# Patient Record
Sex: Female | Born: 1970 | Race: Asian | Hispanic: No | Marital: Married | Smoking: Never smoker
Health system: Southern US, Community
[De-identification: ages and names within clinical notes are randomized; demographics above are authoritative.]

## PROBLEM LIST (undated history)

## (undated) DIAGNOSIS — R112 Nausea with vomiting, unspecified: Secondary | ICD-10-CM

## (undated) DIAGNOSIS — M549 Dorsalgia, unspecified: Secondary | ICD-10-CM

## (undated) DIAGNOSIS — Z87442 Personal history of urinary calculi: Secondary | ICD-10-CM

## (undated) DIAGNOSIS — E119 Type 2 diabetes mellitus without complications: Secondary | ICD-10-CM

## (undated) DIAGNOSIS — Z9889 Other specified postprocedural states: Secondary | ICD-10-CM

## (undated) DIAGNOSIS — E1165 Type 2 diabetes mellitus with hyperglycemia: Secondary | ICD-10-CM

## (undated) DIAGNOSIS — N189 Chronic kidney disease, unspecified: Secondary | ICD-10-CM

## (undated) HISTORY — PX: CHOLECYSTECTOMY, LAPAROSCOPIC: SHX56

## (undated) HISTORY — DX: Type 2 diabetes mellitus without complications: E11.9

## (undated) HISTORY — DX: Type 2 diabetes mellitus with hyperglycemia: E11.65

## (undated) HISTORY — DX: Dorsalgia, unspecified: M54.9

---

## 1998-03-25 ENCOUNTER — Encounter: Payer: Self-pay | Admitting: General Surgery

## 1998-03-25 ENCOUNTER — Observation Stay (HOSPITAL_COMMUNITY): Admission: RE | Admit: 1998-03-25 | Discharge: 1998-03-26 | Payer: Self-pay | Admitting: General Surgery

## 2000-02-18 ENCOUNTER — Other Ambulatory Visit: Admission: RE | Admit: 2000-02-18 | Discharge: 2000-02-18 | Payer: Self-pay | Admitting: *Deleted

## 2000-02-24 ENCOUNTER — Encounter: Admission: RE | Admit: 2000-02-24 | Discharge: 2000-02-24 | Payer: Self-pay | Admitting: *Deleted

## 2000-02-24 ENCOUNTER — Encounter: Payer: Self-pay | Admitting: *Deleted

## 2004-03-27 ENCOUNTER — Encounter: Admission: RE | Admit: 2004-03-27 | Discharge: 2004-03-27 | Payer: Self-pay | Admitting: *Deleted

## 2004-05-02 ENCOUNTER — Encounter: Admission: RE | Admit: 2004-05-02 | Discharge: 2004-05-02 | Payer: Self-pay | Admitting: Orthopedic Surgery

## 2005-07-16 IMAGING — US US SOFT TISSUE HEAD/NECK
1 series · 14 of 25 positions shown · non-contrast
Comparison: None.

CLINICAL DATA: Right sided neck mass. 
 SOFT TISSUE ULTRASOUND HEAD AND NECK:
TECHNIQUE: Real time multiplanar gray scale ultrasonography of the thyroid gland was performed.  
 The right thyroid lobe measures 4.8 x 1.1 x 1.9cm.  The left lobe measures 4.2 x 1.1 x 1.6cm.  The isthmus is approximately 4mm in thickness.  The thyroid echotexture is minimally heterogeneous without focal thyroid nodule.

[Series 1: unknown · 0.09mm/px · 14 of 29 slices shown]
[im 1/29]
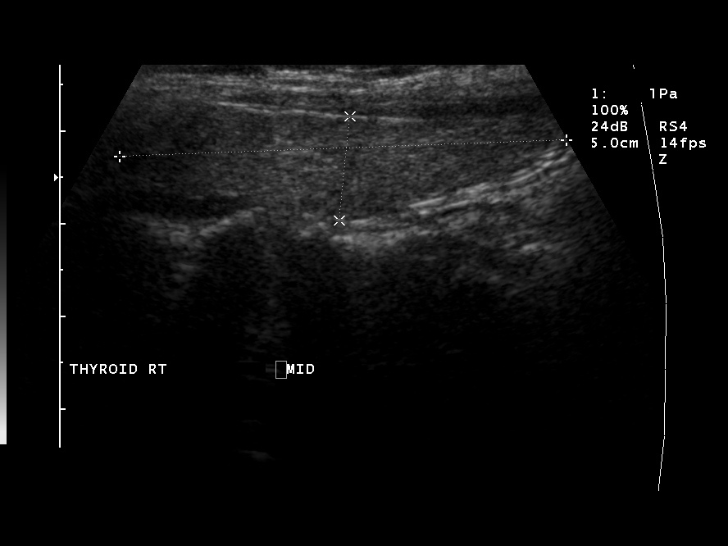
[im 3/29]
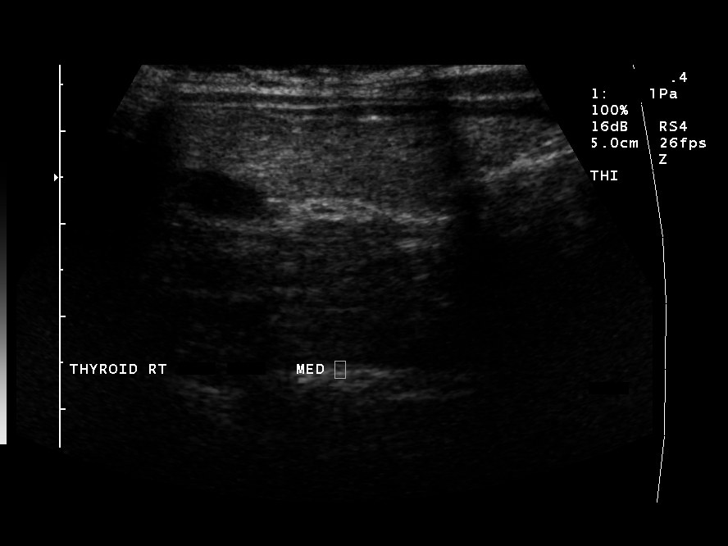
[im 5/29]
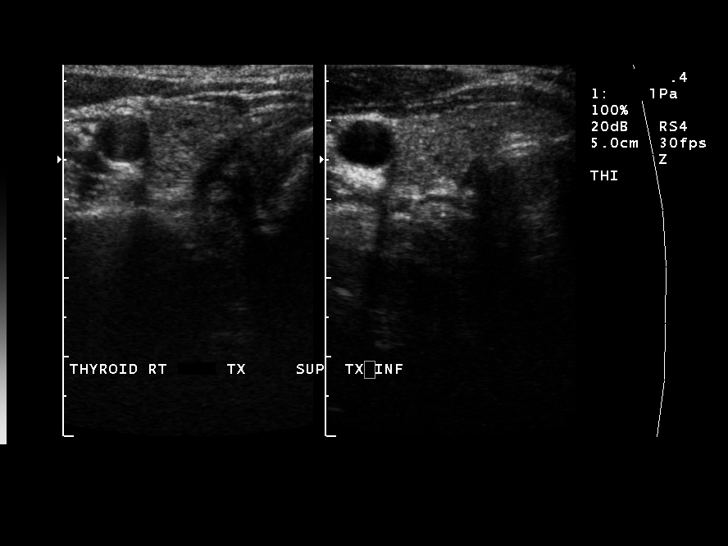
[im 8/29]
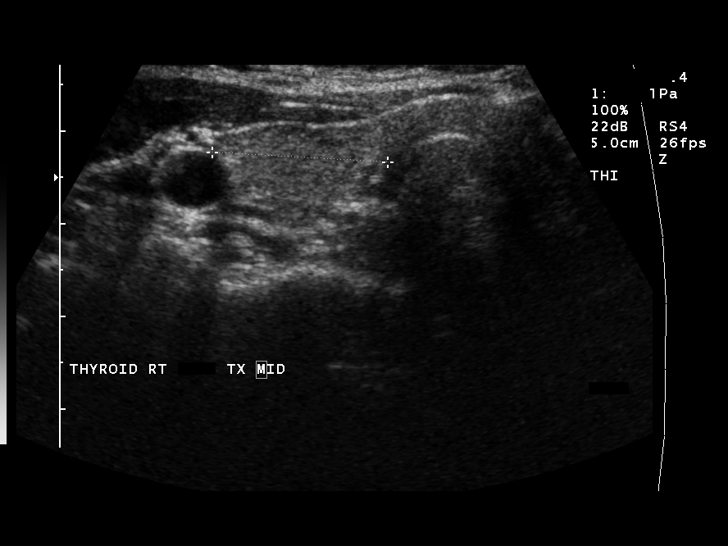
[im 10/29]
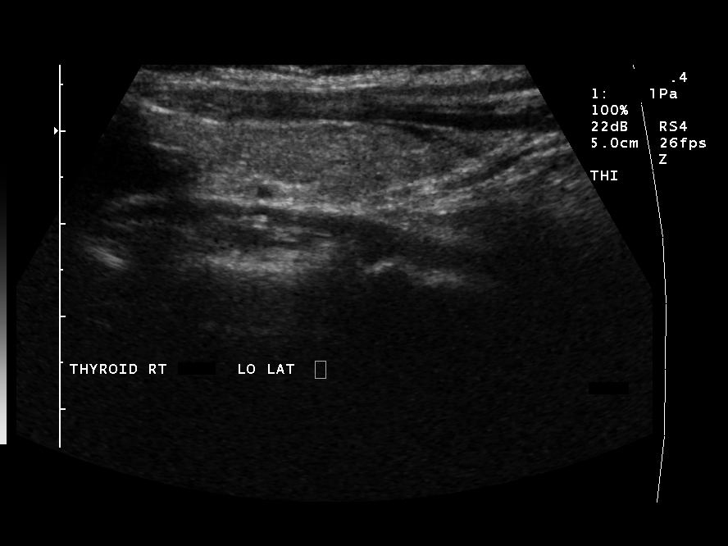
[im 11/29]
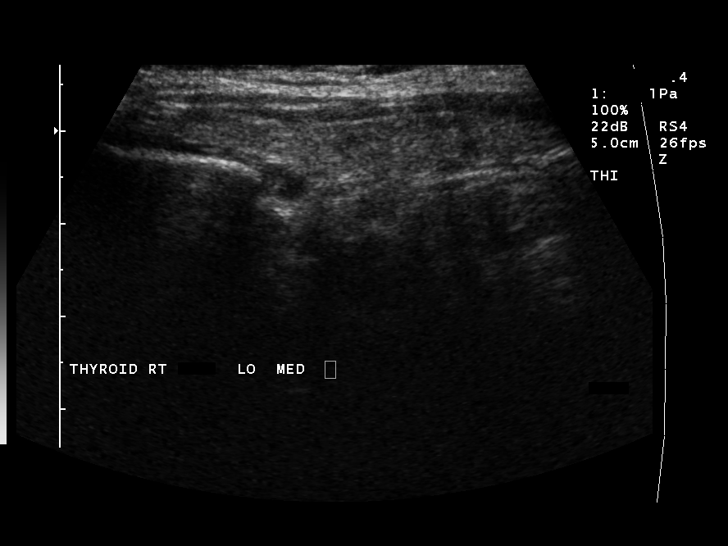
[im 13/29]
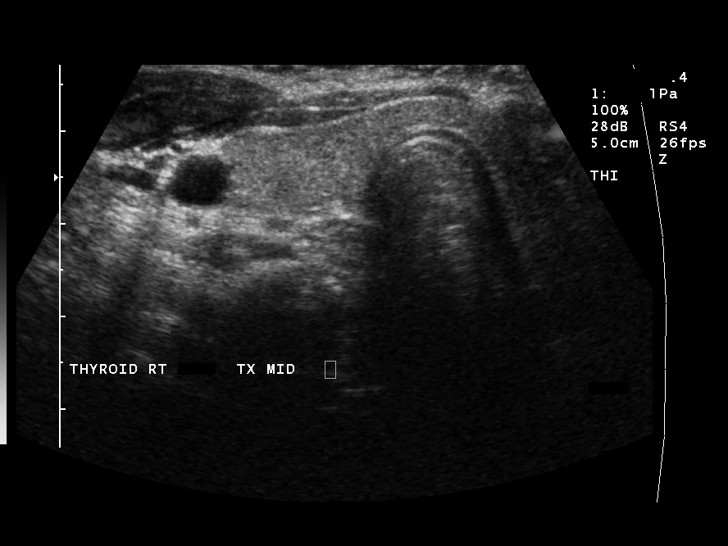
[im 16/29]
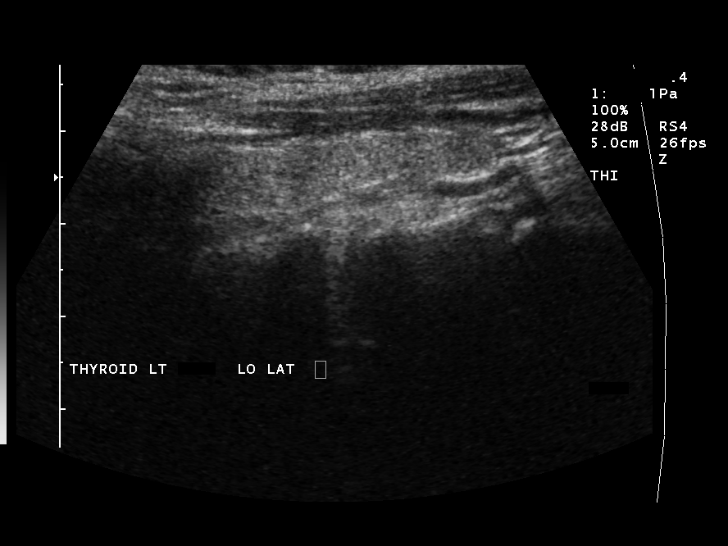
[im 18/29]
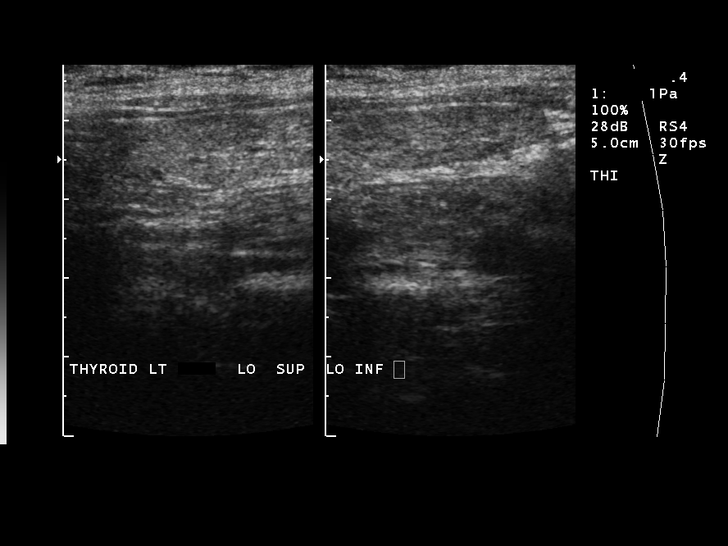
[im 19/29]
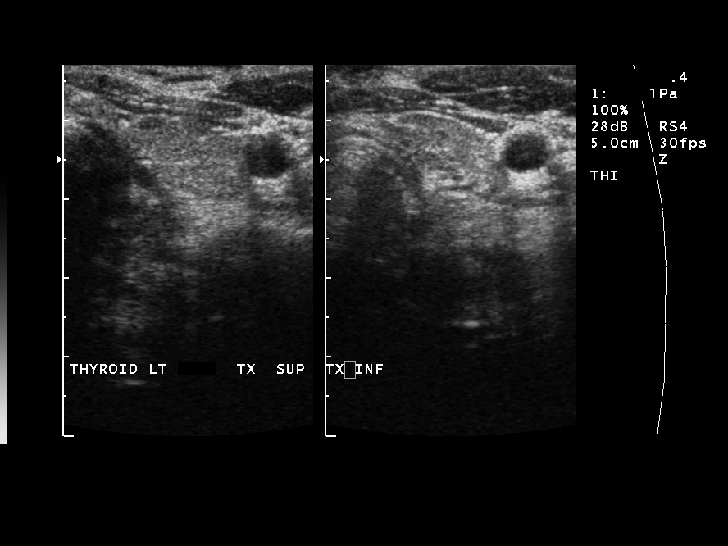
[im 22/29]
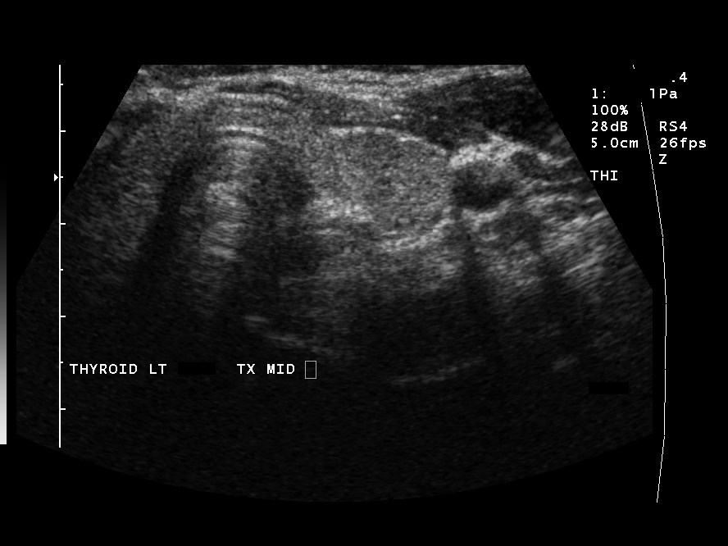
[im 24/29]
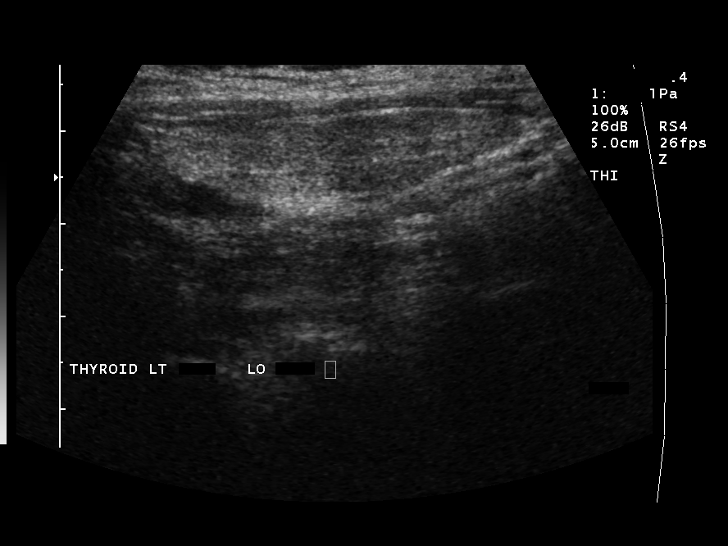
[im 26/29]
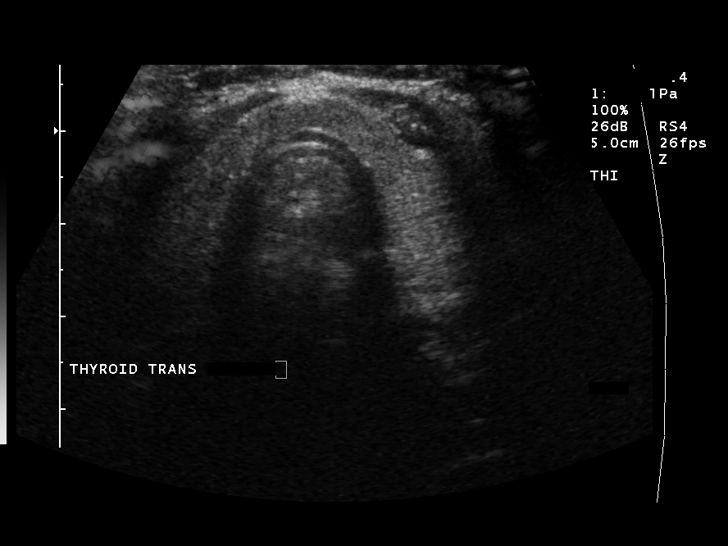
[im 29/29]
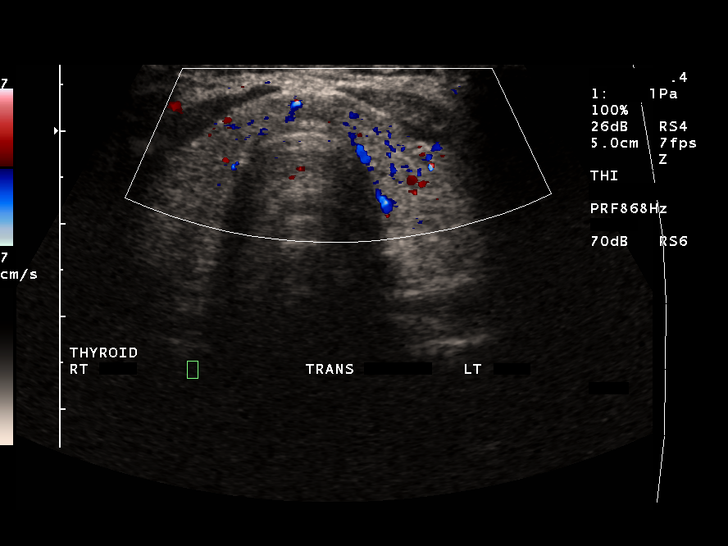

[14 of 25 positions shown; findings below may reference images not displayed]

IMPRESSION: Mild heterogeneity of the thyroid echotexture without a discrete lesion.  
 Ultrasound is effective at evaluating the thyroid gland, but if the patient?s right neck mass is not in the thyroid gland, CT scanning would be a better means to evaluate.

## 2010-01-26 ENCOUNTER — Encounter: Payer: Self-pay | Admitting: Family Medicine

## 2010-01-26 ENCOUNTER — Encounter: Payer: Self-pay | Admitting: Orthopedic Surgery

## 2011-08-28 ENCOUNTER — Emergency Department (HOSPITAL_COMMUNITY): Payer: Medicaid Other

## 2011-08-28 ENCOUNTER — Emergency Department (HOSPITAL_COMMUNITY)
Admission: EM | Admit: 2011-08-28 | Discharge: 2011-08-28 | Disposition: A | Discharge status: Home / Self Care | Payer: Medicaid Other | Attending: Emergency Medicine | Admitting: Emergency Medicine

## 2011-08-28 ENCOUNTER — Encounter (HOSPITAL_COMMUNITY): Payer: Self-pay | Admitting: Emergency Medicine

## 2011-08-28 DIAGNOSIS — N200 Calculus of kidney: Secondary | ICD-10-CM

## 2011-08-28 DIAGNOSIS — N201 Calculus of ureter: Secondary | ICD-10-CM

## 2011-08-28 LAB — URINALYSIS, ROUTINE W REFLEX MICROSCOPIC
Bilirubin Urine: NEGATIVE
Ketones, ur: 15 mg/dL — AB
Specific Gravity, Urine: 1.034 — ABNORMAL HIGH (ref 1.005–1.030)
pH: 6.5 (ref 5.0–8.0)

## 2011-08-28 LAB — URINE MICROSCOPIC-ADD ON

## 2011-08-28 MED ORDER — OXYCODONE-ACETAMINOPHEN 5-325 MG PO TABS
2.0000 | ORAL_TABLET | Freq: Once | ORAL | Status: AC
  Administered 2011-08-28: 2 via ORAL
  Filled 2011-08-28: qty 2

## 2011-08-28 MED ORDER — IBUPROFEN 800 MG PO TABS: 800.0000 mg | ORAL_TABLET | Freq: Three times a day (TID) | ORAL | Status: AC

## 2011-08-28 MED ORDER — OXYCODONE-ACETAMINOPHEN 5-325 MG PO TABS: 2.0000 | ORAL_TABLET | ORAL | Status: AC | PRN

## 2011-08-28 NOTE — ED Provider Notes (Signed)
History     CSN: [id number]  Arrival date & time 08/28/11  1048   First MD Initiated Contact with Patient 08/28/11 1247      Chief Complaint  Patient presents with  . Flank Pain  . Hematuria    (Consider location/radiation/quality/duration/timing/severity/associated sxs/prior treatment) HPI Comments: 41 y/o female presents with sudden onset left sided flank pain at 6am this morning. States this woke her up from sleep. Pain comes and goes at random, describes it as sharp and pressure-like rated 10/10. Felt as if she had to urinate so went to the bathroom and only a little urine came out. States urine appeared bloody. Denies dysuria. No hx of kidney stones. Denies increased frequency, abdominal pain, back pain, n/v, fever, chills, chest pain, sob. Took a percocet she had at home with only some mild relief. She was feeling well prior to this morning. Does not get her menses due to having mirena. Denies any vaginal pain or discharge.  Patient is a 41 y.o. female presenting with flank pain and hematuria. The history is provided by the patient.  Flank Pain Pertinent negatives include no abdominal pain, chest pain, chills, fever, nausea or vomiting.  Hematuria Irritative symptoms include urgency. Irritative symptoms do not include frequency. Associated symptoms include flank pain. Pertinent negatives include no abdominal pain, chills, fever, nausea or vomiting.    History reviewed. No pertinent past medical history.  History reviewed. No pertinent past surgical history.  History reviewed. No pertinent family history.  History  Substance Use Topics  . Smoking status: Never Smoker   . Smokeless tobacco: Not on file  . Alcohol Use: No    OB History    Grav Para Term Preterm Abortions TAB SAB Ect Mult Living                  Review of Systems  Constitutional: Negative for fever and chills.  Respiratory: Negative for shortness of breath.   Cardiovascular: Negative for chest pain.    Gastrointestinal: Negative for nausea, vomiting and abdominal pain.  Genitourinary: Positive for urgency, hematuria and flank pain. Negative for frequency, vaginal discharge and vaginal pain.    Allergies  Review of patient's allergies indicates no known allergies.  Home Medications  No current outpatient prescriptions on file.  BP 141/95  Pulse 104  Temp 98.1 F (36.7 C) (Oral)  Resp 16  SpO2 94%  Physical Exam  Constitutional: She is oriented to person, place, and time. She appears well-developed and well-nourished. No distress.  HENT:  Head: Normocephalic and atraumatic.  Mouth/Throat: Oropharynx is clear and moist.  Eyes: Conjunctivae and EOM are normal.  Neck: Neck supple.  Cardiovascular: Normal rate, regular rhythm and normal heart sounds.   Pulmonary/Chest: Effort normal and breath sounds normal.  Abdominal: Soft. Normal appearance and bowel sounds are normal. There is no tenderness. There is no CVA tenderness.  Neurological: She is alert and oriented to person, place, and time.  Skin: Skin is warm and dry. She is not diaphoretic.  Psychiatric: She has a normal mood and affect. Her behavior is normal.    ED Course  Procedures (including critical care time)  Labs Reviewed  URINALYSIS, ROUTINE W REFLEX MICROSCOPIC - Abnormal; Notable for the following:    Color, Urine AMBER (*)  BIOCHEMICALS MAY BE AFFECTED BY COLOR   APPearance CLOUDY (*)     Specific Gravity, Urine 1.034 (*)     Glucose, UA 500 (*)     Hgb urine dipstick LARGE (*)  Ketones, ur 15 (*)     Protein, ur 100 (*)     Leukocytes, UA TRACE (*)     All other components within normal limits  URINE MICROSCOPIC-ADD ON - Abnormal; Notable for the following:    Squamous Epithelial / LPF FEW (*)     Bacteria, UA FEW (*)     All other components within normal limits  POCT PREGNANCY, URINE   Ct Abdomen Pelvis Wo Contrast  08/28/2011  *RADIOLOGY REPORT*  Clinical Data: Left flank pain.  Dysuria.   Hematuria.  CT ABDOMEN AND PELVIS WITHOUT CONTRAST  Technique:  Multidetector CT imaging of the abdomen and pelvis was performed following the standard protocol without intravenous contrast.  Comparison: None.  Findings: The visualized portion of the liver, spleen, pancreas, and adrenal glands appear unremarkable in noncontrast CT appearance.  The gallbladder is surgically absent.  Common bile duct measures approximately 9 mm in diameter, possibly representing physiologic response to cholecystectomy.  Bilateral nephrolithiasis noted, with six right-sided renal calculi measuring up to 4 mm in diameter and four left renal calculi measuring up to 3 mm in length.  There is also a 2 mm calculus in the left proximal ureter at the level of the lower pole of the left kidney as shown on image 63 of series 5.  Mild left caliectasis noted.  No distal ureteral calculus observed.  Urinary bladder appears unremarkable.  The appendix appears normal.  No dilated bowel observed.  An IUD is present in the uterus.  Uterine contour is unremarkable.  IMPRESSION:  1.  Bilateral nephrolithiasis, with mild left caliectasis and a 2 mm left proximal ureteral calculus.   Original Report Authenticated By: Dellia Cloud, M.D.      1. Nephrolithiasis   2. Ureteral calculi       MDM  41 y/o female with sudden onset left sided flank pain and hematuria. Urine showing blood. Negative CVA or abdominal/pelvic tenderness on exam. Will obtain CT scan to find possible cause of hematuria and intense pain. 2:17 PM CT positive for bilateral nephrolithiasis, caliectasis, and left proximal ureteral calculus. Will discharge with pain control, ibuprofen, urine strainer, and f/u with urology.         Trevor Mace, PA-C 08/28/11 1428

## 2011-08-28 NOTE — ED Provider Notes (Signed)
Patient developed left flank pain onset this morning pain severe intermittent lasting 5 minutes at a time she is presently asymptomatic  Doug Sou, MD 08/28/11 1408

## 2011-08-28 NOTE — ED Provider Notes (Signed)
Medical screening examination/treatment/procedure(s) were conducted as a shared visit with non-physician practitioner(s) and myself.  I personally evaluated the patient during the encounter  Rochanda Harpham, MD 08/28/11 1715 

## 2011-08-28 NOTE — ED Notes (Signed)
Pt states she has pain L flank pain and painful urination. Pt denies vaginal bleeding or discharge. States she has an IUD. The pain began this morning around 4am. Denies pain at present. Pain is intermittent. When pain occurs, it is 10/10. Denies abd pain/discomfort.

## 2011-08-28 NOTE — ED Notes (Signed)
Pt c/o left side pain with bloody urine

## 2011-12-28 ENCOUNTER — Inpatient Hospital Stay (HOSPITAL_COMMUNITY)
Admission: AD | Admit: 2011-12-28 | Discharge: 2011-12-28 | Disposition: A | Discharge status: Hospice | Payer: Medicaid Other | Source: Ambulatory Visit | Attending: Family Medicine | Admitting: Family Medicine

## 2011-12-28 ENCOUNTER — Encounter (HOSPITAL_COMMUNITY): Payer: Self-pay | Admitting: *Deleted

## 2011-12-28 DIAGNOSIS — Z30432 Encounter for removal of intrauterine contraceptive device: Secondary | ICD-10-CM | POA: Insufficient documentation

## 2011-12-28 DIAGNOSIS — N949 Unspecified condition associated with female genital organs and menstrual cycle: Secondary | ICD-10-CM | POA: Insufficient documentation

## 2011-12-28 DIAGNOSIS — R102 Pelvic and perineal pain: Secondary | ICD-10-CM

## 2011-12-28 LAB — URINALYSIS, ROUTINE W REFLEX MICROSCOPIC
Bilirubin Urine: NEGATIVE
Ketones, ur: NEGATIVE mg/dL
Nitrite: NEGATIVE
Protein, ur: NEGATIVE mg/dL
Urobilinogen, UA: 0.2 mg/dL (ref 0.0–1.0)

## 2011-12-28 LAB — URINE MICROSCOPIC-ADD ON

## 2011-12-28 MED ORDER — IBUPROFEN 600 MG PO TABS: 600.0000 mg | ORAL_TABLET | Freq: Four times a day (QID) | ORAL | Status: DC | PRN

## 2011-12-28 NOTE — MAU Note (Addendum)
Had IUD placed 5 yrs ago.  Is overdue to come out, it is irritating.lower abd cramping; has been having pain with urination- started a couple days ago. Migraine headache off and on last couple days.

## 2011-12-28 NOTE — MAU Provider Note (Signed)
CC: overdue to have IUD removed.     First Provider Initiated Contact with Patient 12/28/11 1903      HPI [name] is a 41 y.o. [id number] who is requesting removal of Mirena IUD which has been in about 6 years. She has been having pelvic pressure for months and external irritation after voiding. Denies frequency, urgency, hematuria. Lost health insurance so could not go back to GYN provider and would like GYN appointment for IUD insertion.  LMP 12/20/11. Denies abnormal bleeding.  Past Medical History  Diagnosis Date  . Kidney stone     OB History    Grav Para Term Preterm Abortions TAB SAB Ect Mult Living   4 1 1  3 3    1      # Outc Date GA Lbr Len/2nd Wgt Sex Del Anes PTL Lv   1 TRM            2 TAB            3 TAB            4 TAB               Past Surgical History  Procedure Date  . Cholecystectomy, laparoscopic     History   Social History  . Marital Status: Married    Spouse Name: N/A    Number of Children: N/A  . Years of Education: N/A   Occupational History  . Not on file.   Social History Main Topics  . Smoking status: Never Smoker   . Smokeless tobacco: Not on file  . Alcohol Use: No  . Drug Use: Yes     Comment: takes Hydrocodone for R Flank pain for past 3 years;  . Sexually Active:    Other Topics Concern  . Not on file   Social History Narrative  . No narrative on file    No current facility-administered medications on file prior to encounter.   No current outpatient prescriptions on file prior to encounter.    No Known Allergies  ROS Pertinent items in HPI  PHYSICAL EXAM Filed Vitals:   12/28/11 1836  BP: 132/84  Pulse: 90  Temp:   Resp: 16   General: Well nourished, well developed female in no acute distress Cardiovascular: Normal rate Respiratory: Normal effort Abdomen: Soft, nontender Back: No CVAT Extremities: No edema Neurologic: Alert and oriented Speculum exam: NEFG; vagina with physiologic discharge, no  blood; cervix clean IUD Removal  Patient was in the dorsal lithotomy position, normal external genitalia was noted.  A speculum was placed in the patient's vagina, normal discharge was noted, no lesions. The multiparous cervix was visualized, no lesions, no abnormal discharge. The strings of the IUD were grasped and pulled using ring forceps.  The IUD was successfully removed in its entirety. Scant bleeding.  Patient tolerated the procedure well.    LAB RESULTS Results for orders placed during the hospital encounter of 12/28/11 (from the past 24 hour(s))  URINALYSIS, ROUTINE W REFLEX MICROSCOPIC     Status: Abnormal   Collection Time   12/28/11  6:10 PM      Component Value Range   Color, Urine STRAW (*) YELLOW   APPearance CLEAR  CLEAR   Specific Gravity, Urine <1.005 (*) 1.005 - 1.030   pH 7.0  5.0 - 8.0   Glucose, UA NEGATIVE  NEGATIVE mg/dL   Hgb urine dipstick MODERATE (*) NEGATIVE   Bilirubin Urine NEGATIVE  NEGATIVE  Ketones, ur NEGATIVE  NEGATIVE mg/dL   Protein, ur NEGATIVE  NEGATIVE mg/dL   Urobilinogen, UA 0.2  0.0 - 1.0 mg/dL   Nitrite NEGATIVE  NEGATIVE   Leukocytes, UA SMALL (*) NEGATIVE  URINE MICROSCOPIC-ADD ON     Status: Abnormal   Collection Time   12/28/11  6:10 PM      Component Value Range   Squamous Epithelial / LPF FEW (*) RARE   WBC, UA 0-2  <3 WBC/hpf   RBC / HPF 3-6  <3 RBC/hpf   Bacteria, UA RARE  RARE  POCT PREGNANCY, URINE     Status: Normal   Collection Time   12/28/11  7:46 PM      Component Value Range   Preg Test, Ur NEGATIVE  NEGATIVE       ASSESSMENT IUD removal successful  PLAN Urine C&S sent Discharge home. See AVS for patient education. Counseled to use condoms or abstinence until GYN appointment. Explained that she can apply for Mirena scholarship but will need to pay for insertion fee.    Danae Orleans, CNM 12/28/2011 7:13 PM

## 2011-12-28 NOTE — MAU Provider Note (Signed)
Chart reviewed and agree with management and plan.  

## 2012-01-20 ENCOUNTER — Ambulatory Visit (INDEPENDENT_AMBULATORY_CARE_PROVIDER_SITE_OTHER): Payer: Medicaid Other | Admitting: Obstetrics & Gynecology

## 2012-01-20 ENCOUNTER — Encounter: Payer: Self-pay | Admitting: Obstetrics & Gynecology

## 2012-01-20 VITALS — BP 139/83 | HR 85 | Temp 96.7°F | Ht 59.0 in | Wt 153.3 lb

## 2012-01-20 DIAGNOSIS — Z30430 Encounter for insertion of intrauterine contraceptive device: Secondary | ICD-10-CM

## 2012-01-20 LAB — POCT PREGNANCY, URINE: Preg Test, Ur: NEGATIVE

## 2012-01-20 NOTE — Patient Instructions (Signed)

## 2012-01-20 NOTE — Progress Notes (Signed)
Patient ID: [name], female   DOB: [birth year], 42 y.o.   MRN: [id number] Patient identified, informed consent performed.  Discussed risks of irregular bleeding, cramping, infection, malpositioning or misplacement of the IUD outside the uterus which may require further procedures. Time out was performed.  Urine pregnancy test negative.  Speculum placed in the vagina.  Cervix visualized.  Cleaned with Betadine x 2. Hurricaine spray applied to ant lip of cervix.  Cervix grasped anteriorly with a single tooth tenaculum.  Uterus sounded to 7 cm.  Mirena IUD placed per manufacturer's recommendations.  Strings trimmed to 3 cm. Tenaculum was removed, good hemostasis noted.  Patient tolerated procedure well.   Patient was given post-procedure instructions and the Mirena care card with expiration date.  Patient was also asked to check IUD strings periodically and follow up in 4-6 weeks for IUD check.

## 2012-02-19 ENCOUNTER — Ambulatory Visit: Payer: Medicaid Other | Admitting: Obstetrics & Gynecology

## 2012-02-29 ENCOUNTER — Encounter: Payer: Self-pay | Admitting: Obstetrics & Gynecology

## 2012-03-14 ENCOUNTER — Ambulatory Visit: Payer: Medicaid Other | Admitting: Obstetrics & Gynecology

## 2012-06-22 ENCOUNTER — Ambulatory Visit (INDEPENDENT_AMBULATORY_CARE_PROVIDER_SITE_OTHER): Payer: Medicaid Other | Admitting: Nurse Practitioner

## 2012-06-22 ENCOUNTER — Encounter: Payer: Self-pay | Admitting: Nurse Practitioner

## 2012-06-22 VITALS — BP 122/83 | HR 92 | Ht 59.0 in | Wt 152.0 lb

## 2012-06-22 DIAGNOSIS — M546 Pain in thoracic spine: Secondary | ICD-10-CM | POA: Insufficient documentation

## 2012-06-22 DIAGNOSIS — M545 Low back pain: Secondary | ICD-10-CM | POA: Insufficient documentation

## 2012-06-22 NOTE — Progress Notes (Signed)
HPI: Patient returns for followup after initial visit with Dr. Terrace Arabia 10/27/2011 for right thoracic pain. She complains of for years history of right thoracic pain, no trigger identified, right side deep achy pain, felt something was moving if she is twisting her trunk, no gait difficulty, no incontinence. Over the years, she has tried and failed chiropractor, physical therapy, anti-inflammatory medications, epidural injection, reported normal MRI lumbar a year ago She has acute abdominal pain, was diagnosed with kidney stone recently She continues to complain of pain in the right flank area, it is not along the spine, her thoracic MRI of the spine was normal. She is seen by pain management clinic in [place]. She has never been to a urologist.  ROS:  negative  Physical Exam General: well developed, obese female, seated, in no evident distress Head: head normocephalic and atraumatic. Oropharynx benign Neck: supple with no carotid  bruits Cardiovascular: regular rate and rhythm, no murmurs  Neurologic Exam Mental Status: Awake and fully alert. Oriented to place and time. Follows all commands. Speech and language normal.   Cranial Nerves:  Pupils equal, briskly reactive to light. Extraocular movements full without nystagmus. Visual fields full to confrontation. Hearing intact and symmetric to finger snap. Facial sensation intact. Face, tongue, palate move normally and symmetrically. Neck flexion and extension normal.  Motor: Normal bulk and tone. Normal strength in all tested extremity muscles.No focal weakness Sensory.: intact to touch and pinprick and vibratory.  Coordination: Rapid alternating movements normal in all extremities. Finger-to-nose and heel-to-shin performed accurately bilaterally. No dysmetria Gait and Station: Arises from chair without difficulty. Stance is normal.  Able to heel, toe and tandem walk without difficulty.  Reflexes: 2+ and symmetric. Toes downgoing.      ASSESSMENT: 3-1/2 year history of right lower thoracic paraspinal muscle tenderness with normal neurologic exam and normal thoracic MRI Kidney stones per CT of the abdomen     PLAN: Questionable etiology of her back pain, currently being seen at pain clinic Followup when necessary  Nilda Riggs, GNP-BC APRN

## 2012-06-22 NOTE — Patient Instructions (Addendum)
Given copy of normal MRI of the thoracic spine Patient claims she is not aware that she has kidney stones, given ER record, she never followed up with urology Followup as necessary

## 2012-07-18 ENCOUNTER — Other Ambulatory Visit: Payer: Self-pay | Admitting: Urology

## 2012-07-19 ENCOUNTER — Encounter (HOSPITAL_COMMUNITY): Payer: Self-pay

## 2012-07-19 ENCOUNTER — Encounter (HOSPITAL_COMMUNITY): Payer: Self-pay | Admitting: Pharmacy Technician

## 2012-07-19 ENCOUNTER — Encounter (HOSPITAL_COMMUNITY)
Admission: RE | Admit: 2012-07-19 | Discharge: 2012-07-19 | Disposition: A | Discharge status: Home / Self Care | Payer: Medicaid Other | Source: Ambulatory Visit | Attending: Urology | Admitting: Urology

## 2012-07-19 DIAGNOSIS — Z01812 Encounter for preprocedural laboratory examination: Secondary | ICD-10-CM | POA: Insufficient documentation

## 2012-07-19 HISTORY — DX: Personal history of urinary calculi: Z87.442

## 2012-07-19 LAB — CBC
HCT: 40.5 % (ref 36.0–46.0)
Hemoglobin: 13.3 g/dL (ref 12.0–15.0)
RBC: 4.62 MIL/uL (ref 3.87–5.11)
RDW: 12.5 % (ref 11.5–15.5)
WBC: 7.6 10*3/uL (ref 4.0–10.5)

## 2012-07-19 NOTE — Patient Instructions (Signed)
[name]  07/19/2012                           YOUR PROCEDURE IS SCHEDULED ON: 07/29/12               PLEASE REPORT TO SHORT STAY CENTER AT : 6:45 am               CALL THIS NUMBER IF ANY PROBLEMS THE DAY OF SURGERY :               [phone]                      REMEMBER:   Do not eat food or drink liquids AFTER MIDNIGHT    Take these medicines the morning of surgery with A SIP OF WATER: hydrocodone if needed   Do not wear jewelry, make-up   Do not wear lotions, powders, or perfumes.   Do not shave legs or underarms 12 hrs. before surgery (men may shave face)  Do not bring valuables to the hospital.  Contacts, dentures or bridgework may not be worn into surgery.  Leave suitcase in the car. After surgery it may be brought to your room.  For patients admitted to the hospital more than one night, checkout time is 11:00                          The day of discharge.   Patients discharged the day of surgery will not be allowed to drive home                             If going home same day of surgery, must have someone stay with you first                           24 hrs at home and arrange for some one to drive you home from hospital.    Special Instructions:   Please read over the following fact sheets that you were given:                            2. [place] PREPARING FOR SURGERY SHEET                                                X_____________________________________________________________________        Failure to follow these instructions may result in cancellation of your surgery

## 2012-07-28 MED ORDER — GENTAMICIN SULFATE 40 MG/ML IJ SOLN
260.0000 mg | INTRAVENOUS | Status: DC
  Filled 2012-07-28: qty 6.5

## 2012-07-29 ENCOUNTER — Encounter (HOSPITAL_COMMUNITY): Payer: Self-pay | Admitting: *Deleted

## 2012-07-29 ENCOUNTER — Ambulatory Visit (HOSPITAL_COMMUNITY)
Admission: RE | Admit: 2012-07-29 | Discharge: 2012-07-29 | Disposition: A | Discharge status: Home / Self Care | Payer: Medicaid Other | Source: Ambulatory Visit | Attending: Urology | Admitting: Urology

## 2012-07-29 ENCOUNTER — Encounter (HOSPITAL_COMMUNITY): Payer: Self-pay | Admitting: Anesthesiology

## 2012-07-29 ENCOUNTER — Encounter (HOSPITAL_COMMUNITY)
Admission: RE | Disposition: A | Discharge status: Home / Self Care | Payer: Self-pay | Source: Ambulatory Visit | Attending: Urology

## 2012-07-29 ENCOUNTER — Ambulatory Visit (HOSPITAL_COMMUNITY): Payer: Medicaid Other | Admitting: Anesthesiology

## 2012-07-29 DIAGNOSIS — N200 Calculus of kidney: Secondary | ICD-10-CM | POA: Insufficient documentation

## 2012-07-29 DIAGNOSIS — G8929 Other chronic pain: Secondary | ICD-10-CM | POA: Insufficient documentation

## 2012-07-29 DIAGNOSIS — M549 Dorsalgia, unspecified: Secondary | ICD-10-CM | POA: Insufficient documentation

## 2012-07-29 DIAGNOSIS — Z87442 Personal history of urinary calculi: Secondary | ICD-10-CM | POA: Insufficient documentation

## 2012-07-29 HISTORY — PX: CYSTOSCOPY WITH URETEROSCOPY AND STENT PLACEMENT: SHX6377

## 2012-07-29 SURGERY — CYSTOURETEROSCOPY, WITH STENT INSERTION
Anesthesia: General | Laterality: Bilateral | Wound class: Clean Contaminated

## 2012-07-29 MED ORDER — FENTANYL CITRATE 0.05 MG/ML IJ SOLN
INTRAMUSCULAR | Status: AC
  Filled 2012-07-29: qty 2

## 2012-07-29 MED ORDER — FENTANYL CITRATE 0.05 MG/ML IJ SOLN
INTRAMUSCULAR | Status: DC | PRN
  Administered 2012-07-29 (×4): 50 ug via INTRAVENOUS

## 2012-07-29 MED ORDER — SODIUM CHLORIDE 0.9 % IR SOLN
Status: DC | PRN
  Administered 2012-07-29: 4000 mL

## 2012-07-29 MED ORDER — SENNOSIDES-DOCUSATE SODIUM 8.6-50 MG PO TABS: 1.0000 | ORAL_TABLET | Freq: Two times a day (BID) | ORAL | Status: DC

## 2012-07-29 MED ORDER — PROMETHAZINE HCL 25 MG/ML IJ SOLN: 6.2500 mg | INTRAMUSCULAR | Status: DC | PRN

## 2012-07-29 MED ORDER — HYDROCODONE-ACETAMINOPHEN 10-325 MG PO TABS: 1.0000 | ORAL_TABLET | Freq: Four times a day (QID) | ORAL | Status: DC | PRN

## 2012-07-29 MED ORDER — GENTAMICIN SULFATE 40 MG/ML IJ SOLN
103.5000 mg | INTRAVENOUS | Status: DC | PRN
  Administered 2012-07-29: 260 mg via INTRAVENOUS

## 2012-07-29 MED ORDER — KETOROLAC TROMETHAMINE 30 MG/ML IJ SOLN
INTRAMUSCULAR | Status: DC | PRN
  Administered 2012-07-29: 30 mg via INTRAVENOUS

## 2012-07-29 MED ORDER — 0.9 % SODIUM CHLORIDE (POUR BTL) OPTIME
TOPICAL | Status: DC | PRN
  Administered 2012-07-29: 1000 mL

## 2012-07-29 MED ORDER — SULFAMETHOXAZOLE-TMP DS 800-160 MG PO TABS: 1.0000 | ORAL_TABLET | Freq: Every day | ORAL | Status: DC

## 2012-07-29 MED ORDER — LACTATED RINGERS IV SOLN
INTRAVENOUS | Status: DC
  Administered 2012-07-29: 1000 mL via INTRAVENOUS
  Administered 2012-07-29: 08:00:00 via INTRAVENOUS

## 2012-07-29 MED ORDER — INDIGOTINDISULFONATE SODIUM 8 MG/ML IJ SOLN
INTRAMUSCULAR | Status: AC
  Filled 2012-07-29: qty 5

## 2012-07-29 MED ORDER — FENTANYL CITRATE 0.05 MG/ML IJ SOLN
25.0000 ug | INTRAMUSCULAR | Status: DC | PRN
  Administered 2012-07-29 (×3): 50 ug via INTRAVENOUS

## 2012-07-29 MED ORDER — OXYBUTYNIN CHLORIDE 5 MG PO TABS: 5.0000 mg | ORAL_TABLET | Freq: Three times a day (TID) | ORAL | Status: DC | PRN

## 2012-07-29 MED ORDER — IOHEXOL 300 MG/ML  SOLN
INTRAMUSCULAR | Status: DC | PRN
  Administered 2012-07-29: 30 mL

## 2012-07-29 MED ORDER — LACTATED RINGERS IV SOLN: INTRAVENOUS | Status: DC

## 2012-07-29 MED ORDER — ONDANSETRON HCL 4 MG/2ML IJ SOLN
INTRAMUSCULAR | Status: DC | PRN
  Administered 2012-07-29: 4 mg via INTRAVENOUS

## 2012-07-29 MED ORDER — IOHEXOL 300 MG/ML  SOLN
INTRAMUSCULAR | Status: AC
  Filled 2012-07-29: qty 1

## 2012-07-29 MED ORDER — PROPOFOL 10 MG/ML IV BOLUS
INTRAVENOUS | Status: DC | PRN
  Administered 2012-07-29: 180 mg via INTRAVENOUS

## 2012-07-29 MED ORDER — LIDOCAINE HCL 2 % EX GEL
CUTANEOUS | Status: AC
  Filled 2012-07-29: qty 10

## 2012-07-29 MED ORDER — OXYBUTYNIN CHLORIDE 5 MG PO TABS
5.0000 mg | ORAL_TABLET | Freq: Three times a day (TID) | ORAL | Status: DC | PRN
  Administered 2012-07-29: 5 mg via ORAL
  Filled 2012-07-29: qty 1

## 2012-07-29 SURGICAL SUPPLY — 18 items
BAG URO CATCHER STRL LF (DRAPE) ×2 IMPLANT
BASKET LASER NITINOL 1.9FR (BASKET) ×1 IMPLANT
BSKT STON RTRVL 120 1.9FR (BASKET) ×1
CATH INTERMIT  6FR 70CM (CATHETERS) ×1 IMPLANT
CATH URET 5FR 28IN OPEN ENDED (CATHETERS) IMPLANT
CLOTH BEACON ORANGE TIMEOUT ST (SAFETY) ×2 IMPLANT
DRAPE CAMERA CLOSED 9X96 (DRAPES) ×2 IMPLANT
GLOVE BIOGEL M STRL SZ7.5 (GLOVE) ×2 IMPLANT
GOWN PREVENTION PLUS XLARGE (GOWN DISPOSABLE) ×2 IMPLANT
GOWN STRL NON-REIN LRG LVL3 (GOWN DISPOSABLE) ×2 IMPLANT
GUIDEWIRE ANG ZIPWIRE 038X150 (WIRE) ×2 IMPLANT
GUIDEWIRE STR DUAL SENSOR (WIRE) ×2 IMPLANT
MANIFOLD NEPTUNE II (INSTRUMENTS) ×2 IMPLANT
PACK CYSTO (CUSTOM PROCEDURE TRAY) ×2 IMPLANT
SHEATH ACCESS URETERAL 24CM (SHEATH) ×1 IMPLANT
STENT CONTOUR 6FRX24X.038 (STENTS) ×2 IMPLANT
TUBE FEEDING 8FR 16IN STR KANG (MISCELLANEOUS) ×2 IMPLANT
TUBING CONNECTING 10 (TUBING) ×2 IMPLANT

## 2012-07-29 NOTE — H&P (Signed)
[name] is an 42 y.o. female.    Chief Complaint: Pre-op Bilateral Ureteroscopic Stone Manipulation  HPI:   1 - Nephrolithiasis - Passed singel stone 2013. No prior surgeries. CT 08/2011 wtih bilateral <49mm stones. CT 07/2012 with bilateral small volume non-obstructing stones (Lt 4mm lower x2 + upper punctate, Rt punctate x several)  2 - Rt Back Pain - Pt with 5 years + of chronic back pain for which she takes narcotics. Had eval by neurologist and others prior. Pain is constant, non-coliky. No relation go full/empty blader. On exam she has focal TTP T12 area and along Rt T12 dermatome. Recent CT wtih small non-obstructing stones only.  PMH sig for chronic pain (narcotics), lap chole  Today "[name]" is seen to proceed with elective bilateral ureteroscopic stone manipulation. Most recent UA without infectious parameters.  Past Medical History  Diagnosis Date  . Back pain   . History of kidney stones     Past Surgical History  Procedure Laterality Date  . Cholecystectomy, laparoscopic      Family History  Problem Relation Age of Onset  . Other Neg Hx    Social History:  reports that she has never smoked. She has never used smokeless tobacco. She reports that she does not drink alcohol or use illicit drugs.  Allergies:  Allergies  Allergen Reactions  . Tramadol Hives, Itching and Rash    No prescriptions prior to admission    No results found for this or any previous visit (from the past 48 hour(s)). No results found.  Review of Systems  Constitutional: Negative.  Negative for fever and chills.  HENT: Negative.   Eyes: Negative.   Respiratory: Negative.   Cardiovascular: Negative.   Gastrointestinal: Negative.   Genitourinary: Positive for flank pain.  Musculoskeletal: Negative.   Skin: Negative.   Neurological: Negative.   Endo/Heme/Allergies: Negative.   Psychiatric/Behavioral: Negative.     There were no vitals taken for this visit. Physical Exam   Constitutional: She is oriented to person, place, and time. She appears well-developed and well-nourished.  HENT:  Head: Normocephalic and atraumatic.  Eyes: EOM are normal. Pupils are equal, round, and reactive to light.  Neck: Normal range of motion. Neck supple.  Cardiovascular: Normal rate and regular rhythm.   Respiratory: Effort normal and breath sounds normal.  GI: Soft. Bowel sounds are normal.  Genitourinary:  Mild diffuse bilateral CVAT  Musculoskeletal: Normal range of motion.  Neurological: She is alert and oriented to person, place, and time.  Skin: Skin is warm and dry.  Psychiatric: She has a normal mood and affect. Her behavior is normal. Judgment and thought content normal.     Assessment/Plan  1 - Nephrolithiasis - We rediscussed ureteroscopic stone manipulation with basketing and laser-lithotripsy in detail.  We rediscussed risks including bleeding, infection, damage to kidney / ureter  bladder, rarely loss of kidney. We rediscussed anesthetic risks and rare but serious surgical complications including DVT, PE, MI, and mortality. We specificallyre addressed that in 5-10% of cases a staged approach is required with stenting followed by re-attempt ureteroscopy if anatomy unfavorable. The patient voiced understanding and wises to proceed. I alsore stated we will plan on placing bilateral stents as is bilateral procedure.  2 - Rt Flank Pain - I again explained that based on imaging stones werre in my opinion NOT the primary source of her pain, but that they may be contributory. She is a wits end and adamantly wants stones removed. I can certainly understand  her situation and this is reasonable.  [name] 07/29/2012, 6:14 AM

## 2012-07-29 NOTE — Transfer of Care (Addendum)
Immediate Anesthesia Transfer of Care Note  Patient: [name]  Procedure(s) Performed: Procedure(s): CYSTOSCOPY WITH BILATERAL URETEROSCOPY AND BILATERAL STENT PLACEMENT, BASKET EXTRACTION OF BILATERAL URETERAL STONES (Bilateral)  Patient Location: PACU  Anesthesia Type:General  Level of Consciousness: awake and alert   Airway & Oxygen Therapy: Patient Spontanous Breathing and Patient connected to face mask oxygen  Post-op Assessment: Report given to PACU RN and Post -op Vital signs reviewed and stable  Post vital signs: Reviewed and stable  Complications: No apparent anesthesia complications

## 2012-07-29 NOTE — Op Note (Addendum)
[name], [name]                ACCOUNT NO.:  [id number]  MEDICAL RECORD NO.:  [id number]  LOCATION:  [facility]                         FACILITY:  [facility]  PHYSICIAN:  Sebastian Ache, MD     DATE OF BIRTH:  September 19, 1970  DATE OF PROCEDURE:  07/29/2012 DATE OF DISCHARGE:  07/29/2012                              OPERATIVE REPORT   PREOPERATIVE DIAGNOSIS:  Bilateral renal stones, refractory back pain.  PROCEDURE: 1. Cystoscopy with bilateral retrograde pyelogram interpretation. 2. Bilateral ureteroscopy with basketing of stones. 3. Insertion of bilateral ureteral stents, 6 x 24, no tether.  ESTIMATED BLOOD LOSS:  Nil.  FINDINGS: 1. Small volume papillary tip calcifications, left greater than right. 2. Unremarkable right retrograde pyelogram. 3. Unremarkable urinary bladder. 4. Bilateral intrarenal stone for compositional analysis.  INDICATION:  [name] is a very pleasant 42 year old lady, who unfortunately has been troubled by chronic back pain.  She has seen multiple providers for this and remains on chronic narcotics.  She has had further workup including CT scan which revealed bilateral intrarenal stones and she has never had acute colic.  She was referred for consideration of management.  Options were discussed including observation versus removal with ureteroscopy with the goal of perhaps improving her flank pain, although she was extensively counseled towards the fact that it is often felt that nonobstructing stones are usually asymptomatic and that removal of stones may or may not impact her discomfort.  She adamantly wished to proceed.  Informed consent was obtained and placed in medical record.  PROCEDURE IN DETAIL:  The patient being [name] verified.  Procedure being bilateral ureteroscopic stone manipulation was confirmed was carried out.  Procedure was carried out.  Time-out was performed.  Intravenous antibiotics administered.  General LMA anesthesia was introduced.   The patient placed into a low lithotomy position.  Sterile field was created by prepping and draping the patient's the patient's vagina, introitus, and proximal thighs using iodine x3.  Next, cystourethroscopy was performed using a 22-French rigid cystoscope with 12-degree offset lens. Inspection of urinary bladder revealed no diverticula, calcifications, papular lesions.  Ureteral orifices were in normal anatomic position bilaterally.  The left ureteral orifice was cannulated with 6-French end- hole catheter and left retrograde pyelogram seen.  Left retrograde pyelogram demonstrated a single left ureter and single system left kidney.  No filling defects or narrowing noted.  A 0.038 Glidewire was advanced at the level of the upper pole and set aside as a safety wire.  Next, semi-rigid ureteroscopy was performed at the entire length of the left ureter alongside a separate Sensor working wire and an 8-French feeding tube in the urinary bladder for pressure release. This revealed no mucosal abnormalities or calcifications within the left ureter.  Next, the semi-rigid ureteroscope was exchanged for a 12/14, 24 cm ureteral access sheath and under continuous fluoroscopic vision to the level of the proximal ureter.  Next, flexible ureteroscopy was performed using 8-French digital ureteroscope allowing inspection of the proximal ureter and an  systematic inspection of each calyx of the left kidney.  There were multifocal papillary tip calcifications approximately four in total, the largest which appeared to be about 4 mm.  These  appeared to be amenable to simple basketing.  As such, an escape basket was used to easily grasp each calcification individually and removed and set aside for compositional analysis.  Repeat inspection revealed no residual calcifications.  No evidence of perforation and excellent hemostasis.  The sheath was removed under continuous ureteroscopic vision.  No mucosal  abnormalities or calcifications were found.  Finally, a new 6 x 24 double-J stent was placed over the remaining safety wire.  Using fluoroscopic guidance, good proximal and distal curl were noted.  Attention was then directed to the right side. The cystoscope and the right ureteral orifice was cannulated easily with 6-French end-hole catheter and right retrograde pyelogram was obtained.  Right retrograde pyelogram demonstrated a single right ureter, single system, right kidney.  No filling defects or narrowing noted.  A 0.038 Glidewire was advanced at the level of the upper pole and set aside as a safety wire.  Next, semi-rigid ureteroscopy was performed of the entire length of the right ureter alongside a separate Sensor working wire and an 8-French feeding tube in the urinary bladder for pressure release. This revealed no mucosal abnormalities or calcifications of the right ureter.  The semi-rigid ureteroscope was exchanged.  Once again, for the 24 cm 12/14 ureteral access sheath under continuous fluoroscopic guidance.  Next, flexible ureteroscopy was performed at the proximal ureter and systematic inspection of the right kidney.  There also appeared to be approximately three papillary tip calcifications at the right side, the largest of which is only 2 mm in diameter.  Each of these were sequentially basketed and brought out in their entirety set aside for compositional analysis.  Repeat inspection revealed complete resolution of calcifications and no evidence of perforation, and there was excellent hemostasis.  The right side of the sheath was removed under continuous ureteroscopic vision.  No mucosal abnormalities were found.  Finally, a new 6 x 24 stent was placed with the remaining right safety wire.  Good proximal and distal curl were noted.  Bladder was emptied per cystoscope.  Procedure was then terminated.  The patient tolerated the procedure well.  There were no immediate  periprocedural complications.  The patient was taken to postanesthesia care unit in stable condition.          ______________________________ Sebastian Ache, MD     TM/MEDQ  D:  07/29/2012  T:  07/29/2012  Job:  [id number]

## 2012-07-29 NOTE — Anesthesia Postprocedure Evaluation (Signed)
Anesthesia Post Note  Patient: [name]  Procedure(s) Performed: Procedure(s) (LRB): CYSTOSCOPY WITH BILATERAL URETEROSCOPY AND BILATERAL STENT PLACEMENT, BASKET EXTRACTION OF BILATERAL URETERAL STONES (Bilateral)  Anesthesia type: General  Patient location: PACU  Post pain: Pain level controlled  Post assessment: Post-op Vital signs reviewed  Last Vitals:  Filed Vitals:   07/29/12 1030  BP: 91/71  Pulse: 99  Temp:   Resp: 17    Post vital signs: Reviewed  Level of consciousness: sedated  Complications: No apparent anesthesia complications

## 2012-07-29 NOTE — Brief Op Note (Signed)
07/29/2012  10:21 AM  PATIENT:  [name]  42 y.o. female  PRE-OPERATIVE DIAGNOSIS:  BILATERAL RENAL STONES  POST-OPERATIVE DIAGNOSIS:  BILATERAL RENAL STONES  PROCEDURE:  Procedure(s): CYSTOSCOPY WITH BILATERAL URETEROSCOPY AND BILATERAL STENT PLACEMENT, BASKET EXTRACTION OF BILATERAL URETERAL STONES (Bilateral)  SURGEON:  Surgeon(s) and Role:    * Sebastian Ache, MD - Primary  PHYSICIAN ASSISTANT:   ASSISTANTS: none   ANESTHESIA:   general  EBL:     BLOOD ADMINISTERED:none  DRAINS: none   LOCAL MEDICATIONS USED:  NONE  SPECIMEN:  Source of Specimen:  Bilateral Renal Stones  DISPOSITION OF SPECIMEN:  [facility] for Compositional Analysis  COUNTS:  YES  TOURNIQUET:  * No tourniquets in log *  DICTATION: .Other Dictation: Dictation Number [id number]  PLAN OF CARE: Discharge to home after PACU  PATIENT DISPOSITION:  PACU - hemodynamically stable.   Delay start of Pharmacological VTE agent (>24hrs) due to surgical blood loss or risk of bleeding: not applicable

## 2012-07-29 NOTE — Anesthesia Preprocedure Evaluation (Addendum)
Anesthesia Evaluation  Patient identified by MRN, date of birth, ID band Patient awake    Reviewed: Allergy & Precautions, H&P , NPO status , Patient's Chart, lab work & pertinent test results  Airway Mallampati: II TM Distance: >3 FB Neck ROM: Full    Dental  (+) Teeth Intact and Dental Advisory Given   Pulmonary neg pulmonary ROS,  breath sounds clear to auscultation        Cardiovascular negative cardio ROS  Rhythm:Regular Rate:Normal     Neuro/Psych negative neurological ROS  negative psych ROS   GI/Hepatic negative GI ROS, Neg liver ROS,   Endo/Other  negative endocrine ROS  Renal/GU negative Renal ROS  negative genitourinary   Musculoskeletal negative musculoskeletal ROS (+)   Abdominal   Peds  Hematology negative hematology ROS (+)   Anesthesia Other Findings   Reproductive/Obstetrics                          Anesthesia Physical Anesthesia Plan  ASA: I  Anesthesia Plan: General   Post-op Pain Management:    Induction: Intravenous  Airway Management Planned: LMA  Additional Equipment:   Intra-op Plan:   Post-operative Plan: Extubation in OR  Informed Consent: I have reviewed the patients History and Physical, chart, labs and discussed the procedure including the risks, benefits and alternatives for the proposed anesthesia with the patient or authorized representative who has indicated his/her understanding and acceptance.   Dental advisory given  Plan Discussed with: CRNA  Anesthesia Plan Comments:        Anesthesia Quick Evaluation

## 2012-07-30 ENCOUNTER — Emergency Department (HOSPITAL_COMMUNITY)
Admission: EM | Admit: 2012-07-30 | Discharge: 2012-07-30 | Disposition: A | Discharge status: Home / Self Care | Payer: Medicaid Other | Attending: Emergency Medicine | Admitting: Emergency Medicine

## 2012-07-30 ENCOUNTER — Encounter (HOSPITAL_COMMUNITY): Payer: Self-pay | Admitting: *Deleted

## 2012-07-30 DIAGNOSIS — Z79899 Other long term (current) drug therapy: Secondary | ICD-10-CM | POA: Insufficient documentation

## 2012-07-30 DIAGNOSIS — R109 Unspecified abdominal pain: Secondary | ICD-10-CM | POA: Insufficient documentation

## 2012-07-30 DIAGNOSIS — G8918 Other acute postprocedural pain: Secondary | ICD-10-CM | POA: Insufficient documentation

## 2012-07-30 DIAGNOSIS — R1110 Vomiting, unspecified: Secondary | ICD-10-CM | POA: Insufficient documentation

## 2012-07-30 DIAGNOSIS — Z3202 Encounter for pregnancy test, result negative: Secondary | ICD-10-CM | POA: Insufficient documentation

## 2012-07-30 DIAGNOSIS — Z87442 Personal history of urinary calculi: Secondary | ICD-10-CM | POA: Insufficient documentation

## 2012-07-30 DIAGNOSIS — Z9889 Other specified postprocedural states: Secondary | ICD-10-CM | POA: Insufficient documentation

## 2012-07-30 DIAGNOSIS — G8929 Other chronic pain: Secondary | ICD-10-CM | POA: Insufficient documentation

## 2012-07-30 LAB — CBC WITH DIFFERENTIAL/PLATELET
Lymphocytes Relative: 5 % — ABNORMAL LOW (ref 12–46)
Lymphs Abs: 0.7 10*3/uL (ref 0.7–4.0)
Neutrophils Relative %: 93 % — ABNORMAL HIGH (ref 43–77)
Platelets: 331 10*3/uL (ref 150–400)
RBC: 5.04 MIL/uL (ref 3.87–5.11)
WBC: 12.9 10*3/uL — ABNORMAL HIGH (ref 4.0–10.5)

## 2012-07-30 LAB — COMPREHENSIVE METABOLIC PANEL
ALT: 56 U/L — ABNORMAL HIGH (ref 0–35)
Alkaline Phosphatase: 89 U/L (ref 39–117)
CO2: 24 mEq/L (ref 19–32)
Chloride: 96 mEq/L (ref 96–112)
GFR calc Af Amer: >90 mL/min (ref >90)
GFR calc non Af Amer: >90 mL/min (ref >90)
Glucose, Bld: 156 mg/dL — ABNORMAL HIGH (ref 70–99)
Potassium: 3.6 mEq/L (ref 3.5–5.1)
Sodium: 134 mEq/L — ABNORMAL LOW (ref 135–145)

## 2012-07-30 LAB — URINALYSIS, ROUTINE W REFLEX MICROSCOPIC
Bilirubin Urine: NEGATIVE
Specific Gravity, Urine: 1.023 (ref 1.005–1.030)
Urobilinogen, UA: 0.2 mg/dL (ref 0.0–1.0)

## 2012-07-30 LAB — URINE MICROSCOPIC-ADD ON

## 2012-07-30 MED ORDER — SODIUM CHLORIDE 0.9 % IV BOLUS (SEPSIS)
2000.0000 mL | Freq: Once | INTRAVENOUS | Status: AC
  Administered 2012-07-30: 1000 mL via INTRAVENOUS

## 2012-07-30 MED ORDER — ONDANSETRON HCL 4 MG/2ML IJ SOLN
4.0000 mg | Freq: Once | INTRAMUSCULAR | Status: AC
  Administered 2012-07-30: 4 mg via INTRAVENOUS
  Filled 2012-07-30: qty 2

## 2012-07-30 MED ORDER — METOCLOPRAMIDE HCL 10 MG PO TABS: 10.0000 mg | ORAL_TABLET | Freq: Four times a day (QID) | ORAL | Status: DC | PRN

## 2012-07-30 MED ORDER — ONDANSETRON 8 MG PO TBDP: ORAL_TABLET | ORAL | Status: DC

## 2012-07-30 MED ORDER — HYDROMORPHONE HCL PF 1 MG/ML IJ SOLN
1.0000 mg | Freq: Once | INTRAMUSCULAR | Status: AC
  Administered 2012-07-30: 1 mg via INTRAVENOUS
  Filled 2012-07-30: qty 1

## 2012-07-30 MED ORDER — OXYCODONE-ACETAMINOPHEN 5-325 MG PO TABS: 2.0000 | ORAL_TABLET | ORAL | Status: DC | PRN

## 2012-07-30 NOTE — Discharge Instructions (Signed)

## 2012-07-30 NOTE — ED Notes (Signed)
Pt ambulated to BR for urine sample with tech with no diffculty

## 2012-07-30 NOTE — ED Provider Notes (Signed)
CSN: [id number]     Arrival date & time 07/30/12  1746 History     First MD Initiated Contact with Patient 07/30/12 1754     No chief complaint on file.  postoperative abdominal pain (Consider location/radiation/quality/duration/timing/severity/associated sxs/prior Treatment) HPI This 42 year old female has chronic back pain on chronic narcotics for that, she was noted to have a bilateral renal stones without acute renal colic and elected to have cystoscopy yesterday with extraction of bilateral renal stones with placement of bilateral ureteral stents, she states she had her typical back pain both pre-and postoperatively however prior to her surgery yesterday did not have any abdominal pain but since she woke up from her surgery yesterday and was discharged home she has had gradually worsening abdominal pain mostly in the suprapubic area but it does radiate towards the epigastrium, she has had multiple episodes of nonbloody vomiting overnight and today since her surgery yesterday, she states the antispasmodic pill she was given postoperatively this is not helping her pain, the hydrocodone she usually takes for her chronic back pain is not helping her abdominal pain today either, the temperature is 100 prior to arrival today, she said no confusion no rash no chest pain cough no shortness breath no diarrhea no bloody stools although she does have hematuria as expected, she is no vaginal discharge no vaginal bleeding. Past Medical History  Diagnosis Date  . Back pain   . History of kidney stones    Past Surgical History  Procedure Laterality Date  . Cholecystectomy, laparoscopic     Family History  Problem Relation Age of Onset  . Other Neg Hx    History  Substance Use Topics  . Smoking status: Never Smoker   . Smokeless tobacco: Never Used  . Alcohol Use: No     Comment: occ   OB History   Grav Para Term Preterm Abortions TAB SAB Ect Mult Living   4 1 1  3 3    1      Review of  Systems 10 Systems reviewed and are negative for acute change except as noted in the HPI. Allergies  Tramadol  Home Medications   Current Outpatient Rx  Name  Route  Sig  Dispense  Refill  . HYDROcodone-acetaminophen (NORCO) 10-325 MG per tablet   Oral   Take 1-2 tablets by mouth every 6 (six) hours as needed. Back pain and side pain   40 tablet   0   . oxybutynin (DITROPAN) 5 MG tablet   Oral   Take 1 tablet (5 mg total) by mouth every 8 (eight) hours as needed. For bladder spasms / stent discomfort.   30 tablet   1   . senna-docusate (SENOKOT-S) 8.6-50 MG per tablet   Oral   Take 1 tablet by mouth 2 (two) times daily. While taking pain meds to prevent constipation   30 tablet   1   . tiZANidine (ZANAFLEX) 4 MG tablet   Oral   Take 4 mg by mouth every 8 (eight) hours as needed (muscle spasm).         Marland Kitchen doxycycline (VIBRA-TABS) 100 MG tablet   Oral   Take 100 mg by mouth 2 (two) times daily.         . metoCLOPramide (REGLAN) 10 MG tablet   Oral   Take 1 tablet (10 mg total) by mouth every 6 (six) hours as needed (nausea/headache).   6 tablet   0   . ondansetron (ZOFRAN ODT) 8 MG disintegrating  tablet      8mg  ODT q4 hours prn nausea   4 tablet   0   . oxyCODONE-acetaminophen (PERCOCET) 5-325 MG per tablet   Oral   Take 2 tablets by mouth every 4 (four) hours as needed for pain.   20 tablet   0   . sulfamethoxazole-trimethoprim (BACTRIM DS) 800-160 MG per tablet   Oral   Take 1 tablet by mouth daily. X 3 days. Begin day prior to next Urology appointment.   3 tablet   0    BP 156/92  Pulse 94  Temp(Src) 98.1 F (36.7 C) (Oral)  Resp 18  SpO2 100%  LMP 07/15/2012 Physical Exam  Nursing note and vitals reviewed. Constitutional:  Awake, alert, nontoxic appearance.  HENT:  Head: Atraumatic.  Eyes: Right eye exhibits no discharge. Left eye exhibits no discharge.  Neck: Neck supple.  Cardiovascular: Normal rate and regular rhythm.   No murmur  heard. Pulmonary/Chest: Effort normal and breath sounds normal. No respiratory distress. She has no wheezes. She has no rales. She exhibits no tenderness.  Abdominal: Soft. Bowel sounds are normal. She exhibits no distension and no mass. There is tenderness. There is no rebound and no guarding.  Moderate suprapubic tenderness mild epigastric tenderness the rest of the abdomen is minimally tender no rebound no percussion tenderness  Musculoskeletal: She exhibits no tenderness.  Baseline ROM, no obvious new focal weakness.  Neurological: She is alert.  Mental status and motor strength appears baseline for patient and situation.  Skin: No rash noted.  Psychiatric: She has a normal mood and affect.    ED Course  Pt feels improved after observation and/or treatment in ED.Patient informed of clinical course, understand medical decision-making process, and agree with plan. Procedures (including critical care time)  Labs Reviewed  CBC WITH DIFFERENTIAL - Abnormal; Notable for the following:    WBC 12.9 (*)    Neutrophils Relative % 93 (*)    Neutro Abs 12.0 (*)    Lymphocytes Relative 5 (*)    Monocytes Relative 2 (*)    All other components within normal limits  COMPREHENSIVE METABOLIC PANEL - Abnormal; Notable for the following:    Sodium 134 (*)    Glucose, Bld 156 (*)    ALT 56 (*)    All other components within normal limits  URINALYSIS, ROUTINE W REFLEX MICROSCOPIC - Abnormal; Notable for the following:    Color, Urine RED (*)    APPearance TURBID (*)    Glucose, UA 100 (*)    Hgb urine dipstick LARGE (*)    Ketones, ur >80 (*)    Protein, ur >300 (*)    Leukocytes, UA MODERATE (*)    All other components within normal limits  LIPASE, BLOOD  URINE MICROSCOPIC-ADD ON  POCT PREGNANCY, URINE   No results found. 1. Postoperative abdominal pain     MDM  I doubt any other EMC precluding discharge at this time including, but not necessarily limited to the following:SBI,  peritonitis.  Hurman Horn, MD 07/31/12 1146

## 2012-07-30 NOTE — ED Notes (Signed)
Pt presents to ed with c/o  Lower abdominal pain since yesterday; pt is s/p stent placement for kidney stones; pt sts pain ever since, also reports nausea and vomiting. Pt also reports urine in blood.

## 2012-08-01 ENCOUNTER — Encounter (HOSPITAL_COMMUNITY): Payer: Self-pay | Admitting: Urology

## 2013-07-14 ENCOUNTER — Ambulatory Visit: Payer: Medicaid Other | Attending: Family Medicine

## 2013-10-28 ENCOUNTER — Ambulatory Visit (INDEPENDENT_AMBULATORY_CARE_PROVIDER_SITE_OTHER): Payer: Managed Care, Other (non HMO) | Admitting: Family Medicine

## 2013-10-28 ENCOUNTER — Ambulatory Visit (INDEPENDENT_AMBULATORY_CARE_PROVIDER_SITE_OTHER): Payer: Managed Care, Other (non HMO)

## 2013-10-28 VITALS — BP 128/84 | HR 89 | Temp 97.7°F | Resp 16 | Ht 60.0 in | Wt 166.0 lb

## 2013-10-28 DIAGNOSIS — M545 Low back pain, unspecified: Secondary | ICD-10-CM

## 2013-10-28 DIAGNOSIS — N200 Calculus of kidney: Secondary | ICD-10-CM

## 2013-10-28 LAB — POCT URINALYSIS DIPSTICK
Bilirubin, UA: NEGATIVE
Glucose, UA: 250
Ketones, UA: NEGATIVE
Nitrite, UA: NEGATIVE
Protein, UA: NEGATIVE
Spec Grav, UA: <=1.005
Urobilinogen, UA: 0.2
pH, UA: 7

## 2013-10-28 LAB — POCT CBC
Granulocyte percent: 70.3 %G (ref 37–80)
HCT, POC: 38.8 % (ref 37.7–47.9)
Hemoglobin: 12.3 g/dL (ref 12.2–16.2)
Lymph, poc: 2.2 (ref 0.6–3.4)
MCH, POC: 27.4 pg (ref 27–31.2)
MCHC: 31.8 g/dL (ref 31.8–35.4)
MCV: 86.4 fL (ref 80–97)
MID (cbc): 0.7 (ref 0–0.9)
MPV: 7.5 fL (ref 0–99.8)
POC Granulocyte: 6.8 (ref 2–6.9)
POC LYMPH PERCENT: 22.7 %L (ref 10–50)
POC MID %: 7 %M (ref 0–12)
Platelet Count, POC: 311 10*3/uL (ref 142–424)
RBC: 4.49 M/uL (ref 4.04–5.48)
RDW, POC: 13.2 %
WBC: 9.7 10*3/uL (ref 4.6–10.2)

## 2013-10-28 LAB — COMPREHENSIVE METABOLIC PANEL
ALT: 12 U/L (ref 0–35)
AST: 12 U/L (ref 0–37)
Albumin: 4.1 g/dL (ref 3.5–5.2)
Alkaline Phosphatase: 64 U/L (ref 39–117)
BUN: 12 mg/dL (ref 6–23)
CO2: 27 mEq/L (ref 19–32)
Calcium: 9.4 mg/dL (ref 8.4–10.5)
Chloride: 103 mEq/L (ref 96–112)
Creat: 0.71 mg/dL (ref 0.50–1.10)
Glucose, Bld: 112 mg/dL — ABNORMAL HIGH (ref 70–99)
Potassium: 3.3 mEq/L — ABNORMAL LOW (ref 3.5–5.3)
Sodium: 139 mEq/L (ref 135–145)
Total Bilirubin: 0.4 mg/dL (ref 0.2–1.2)
Total Protein: 7 g/dL (ref 6.0–8.3)

## 2013-10-28 LAB — POCT UA - MICROSCOPIC ONLY
Casts, Ur, LPF, POC: NEGATIVE
Crystals, Ur, HPF, POC: NEGATIVE
Yeast, UA: NEGATIVE

## 2013-10-28 LAB — POCT SEDIMENTATION RATE: POCT SED RATE: 34 mm/hr — AB (ref 0–22)

## 2013-10-28 MED ORDER — HYDROCODONE-ACETAMINOPHEN 10-325 MG PO TABS: 1.0000 | ORAL_TABLET | Freq: Four times a day (QID) | ORAL | Status: DC | PRN

## 2013-10-28 NOTE — Progress Notes (Addendum)
Patient ID: KIDA DIGIULIO MRN: [id number], DOB: 04-07-1970, 43 y.o. Date of Encounter: 10/28/2013, 12:27 PM  This chart was scribed for Dr. Robyn Haber, MD by Erling Conte, Medical Scribe. This patient was seen in Room 14 and the patient's care was started at 12:31 PM.   Primary Physician: Default, Provider, MD  Chief Complaint: back pain  HPI: 43 y.o. year old female with history below presents with constant, sharp, 9/10 back pain for 1-2 years. The pain is localized in her mid to lower back and radiates down her entire lower back. Does not radiate into extremities. The pain is exacerbated by rotation or bending down to pick up an object. No known injury. She has applied heating pads with no relief. Personal h/o kidney stones. She went to her PCP and had a UA done about one month ago with no acute findings. No recent imaging. No h/o HTN. Denies any fever, chills, vomiting, nausea, urinary symptoms, bowel problems, urinary or bowel incontinence, dysuria, numbness in legs or loss of motor power.    Past Medical History  Diagnosis Date  . Back pain   . History of kidney stones      Home Meds: Prior to Admission medications   Medication Sig Start Date End Date Taking? Authorizing Provider  naproxen sodium (ANAPROX) 220 MG tablet Take 220 mg by mouth 2 (two) times daily with a meal.   Yes Historical Provider, MD  doxycycline (VIBRA-TABS) 100 MG tablet Take 100 mg by mouth 2 (two) times daily.    Historical Provider, MD  HYDROcodone-acetaminophen (NORCO) 10-325 MG per tablet Take 1-2 tablets by mouth every 6 (six) hours as needed. Back pain and side pain 07/29/12   Alexis Frock, MD  metoCLOPramide (REGLAN) 10 MG tablet Take 1 tablet (10 mg total) by mouth every 6 (six) hours as needed (nausea/headache). 07/30/12   Babette Relic, MD  ondansetron (ZOFRAN ODT) 8 MG disintegrating tablet 8mg  ODT q4 hours prn nausea 07/30/12   Babette Relic, MD  oxybutynin (DITROPAN) 5 MG tablet Take 1  tablet (5 mg total) by mouth every 8 (eight) hours as needed. For bladder spasms / stent discomfort. 07/29/12   Alexis Frock, MD  oxyCODONE-acetaminophen (PERCOCET) 5-325 MG per tablet Take 2 tablets by mouth every 4 (four) hours as needed for pain. 07/30/12   Babette Relic, MD  senna-docusate (SENOKOT-S) 8.6-50 MG per tablet Take 1 tablet by mouth 2 (two) times daily. While taking pain meds to prevent constipation 07/29/12   Alexis Frock, MD  sulfamethoxazole-trimethoprim (BACTRIM DS) 800-160 MG per tablet Take 1 tablet by mouth daily. X 3 days. Begin day prior to next Urology appointment. 07/29/12   Alexis Frock, MD  tiZANidine (ZANAFLEX) 4 MG tablet Take 4 mg by mouth every 8 (eight) hours as needed (muscle spasm).    Historical Provider, MD    Allergies:  Allergies  Allergen Reactions  . Tramadol Hives, Itching and Rash    History   Social History  . Marital Status: Married    Spouse Name: N/A    Number of Children: N/A  . Years of Education: N/A   Occupational History  . Not on file.   Social History Main Topics  . Smoking status: Never Smoker   . Smokeless tobacco: Never Used  . Alcohol Use: No     Comment: occ  . Drug Use: No     Comment: takes Hydrocodone for R Flank pain for past 3 years;  . Sexual Activity:  Yes    Birth Control/ Protection: IUD   Other Topics Concern  . Not on file   Social History Narrative  . No narrative on file     Review of Systems: Constitutional: negative for chills, fever, night sweats, weight changes, or fatigue  HEENT: negative for vision changes, hearing loss, congestion, rhinorrhea, ST, epistaxis, or sinus pressure Cardiovascular: negative for chest pain or palpitations Respiratory: negative for hemoptysis, wheezing, shortness of breath, or cough Abdominal: negative for abdominal pain, nausea, vomiting, diarrhea, or constipation Musc: positive for back pain. Dermatological: negative for rash Neurologic: negative for headache,  dizziness, motor function loss, numbness,or syncope GU: negative dysuria, urinary incontinence, flank pain, frequency, or hematuria. All other systems reviewed and are otherwise negative with the exception to those above and in the HPI.   Physical Exam: Blood pressure 128/84, pulse 89, temperature 97.7 F (36.5 C), temperature source Oral, resp. rate 16, height 5' (1.524 m), weight 166 lb (75.297 kg), SpO2 99.00%., Body mass index is 32.42 kg/(m^2). General: Well developed, well nourished, in no acute distress. Head: Normocephalic, atraumatic, eyes without discharge, sclera non-icteric, nares are without discharge. Bilateral auditory canals clear, TM's are without perforation, pearly grey and translucent with reflective cone of light bilaterally. Oral cavity moist, posterior pharynx without exudate, erythema, peritonsillar abscess, or post nasal drip.  Neck: Supple. No thyromegaly. Full ROM. No lymphadenopathy. Lungs: Clear bilaterally to auscultation without wheezes, rales, or rhonchi. Breathing is unlabored. Heart: RRR with S1 S2. No murmurs, rubs, or gallops appreciated. Abdomen: Soft, non-tender, non-distended with normoactive bowel sounds. No hepatomegaly. No rebound/guarding. No obvious abdominal masses. Msk:  Strength and tone normal for age.  Back: Normal ROM. Negative SLR. Hip rotation when supine is normal but when standing causes pain with rotation either direction.  Skin: Hyperemic skin on the right where she has applied heating pads Extremities/Skin: Warm and dry. No clubbing or cyanosis. No edema. No rashes or suspicious lesions. Neuro: Alert and oriented X 3. Moves all extremities spontaneously. Gait is normal. CNII-XII grossly in tact. Reflexes are symmetrical and normal Psych:  Responds to questions appropriately with a normal affect.   Labs: Results for orders placed in visit on 10/28/13  POCT CBC      Result Value Ref Range   WBC 9.7  4.6 - 10.2 K/uL   Lymph, poc 2.2  0.6  - 3.4   POC LYMPH PERCENT 22.7  10 - 50 %L   MID (cbc) 0.7  0 - 0.9   POC MID % 7.0  0 - 12 %M   POC Granulocyte 6.8  2 - 6.9   Granulocyte percent 70.3  37 - 80 %G   RBC 4.49  4.04 - 5.48 M/uL   Hemoglobin 12.3  12.2 - 16.2 g/dL   HCT, POC 38.8  37.7 - 47.9 %   MCV 86.4  80 - 97 fL   MCH, POC 27.4  27 - 31.2 pg   MCHC 31.8  31.8 - 35.4 g/dL   RDW, POC 13.2     Platelet Count, POC 311  142 - 424 K/uL   MPV 7.5  0 - 99.8 fL  POCT UA - MICROSCOPIC ONLY      Result Value Ref Range   WBC, Ur, HPF, POC 3-6     RBC, urine, microscopic 1-4     Bacteria, U Microscopic 1+     Mucus, UA trace     Epithelial cells, urine per micros 5-10     Crystals,  Ur, HPF, POC neg     Casts, Ur, LPF, POC neg     Yeast, UA neg    POCT URINALYSIS DIPSTICK      Result Value Ref Range   Color, UA light yellow     Clarity, UA cloudy     Glucose, UA 250     Bilirubin, UA neg     Ketones, UA neg     Spec Grav, UA <=1.005     Blood, UA trace     pH, UA 7.0     Protein, UA neg     Urobilinogen, UA 0.2     Nitrite, UA neg     Leukocytes, UA Trace       [facility] reading (PRIMARY) by  Dr. Joseph Art:  Large kidney stone in right renal pelvis.    ASSESSMENT AND PLAN:  43 y.o. year old female with right flank pain chronically and right kidney stone  Right-sided low back pain without sciatica - Plan: POCT CBC, POCT SEDIMENTATION RATE, POCT UA - Microscopic Only, POCT urinalysis dipstick, Comprehensive metabolic panel, DG Abd 1 View, Ambulatory referral to Urology, HYDROcodone-acetaminophen ([place]) 10-325 MG per tablet  Kidney stone on right side - Plan: DG Abd 1 View, Ambulatory referral to Urology, HYDROcodone-acetaminophen ([place]) 10-325 MG per tablet  Signed, Robyn Haber, MD   Signed, Robyn Haber, MD 10/28/2013 12:27 PM

## 2013-10-28 NOTE — Patient Instructions (Signed)

## 2013-10-29 ENCOUNTER — Other Ambulatory Visit: Payer: Self-pay | Admitting: Family Medicine

## 2013-10-29 DIAGNOSIS — E876 Hypokalemia: Secondary | ICD-10-CM

## 2013-10-29 MED ORDER — POTASSIUM CHLORIDE CRYS ER 20 MEQ PO TBCR: 20.0000 meq | EXTENDED_RELEASE_TABLET | Freq: Every day | ORAL | Status: DC

## 2013-12-17 ENCOUNTER — Ambulatory Visit (INDEPENDENT_AMBULATORY_CARE_PROVIDER_SITE_OTHER): Payer: Managed Care, Other (non HMO)

## 2013-12-17 ENCOUNTER — Ambulatory Visit: Payer: Self-pay

## 2013-12-17 ENCOUNTER — Ambulatory Visit (INDEPENDENT_AMBULATORY_CARE_PROVIDER_SITE_OTHER): Payer: Managed Care, Other (non HMO) | Admitting: Emergency Medicine

## 2013-12-17 VITALS — BP 122/82 | HR 84 | Temp 97.7°F | Resp 16 | Ht 60.0 in | Wt 164.0 lb

## 2013-12-17 DIAGNOSIS — R05 Cough: Secondary | ICD-10-CM

## 2013-12-17 DIAGNOSIS — E010 Iodine-deficiency related diffuse (endemic) goiter: Secondary | ICD-10-CM

## 2013-12-17 DIAGNOSIS — R109 Unspecified abdominal pain: Secondary | ICD-10-CM

## 2013-12-17 DIAGNOSIS — R1010 Upper abdominal pain, unspecified: Secondary | ICD-10-CM

## 2013-12-17 DIAGNOSIS — Z87442 Personal history of urinary calculi: Secondary | ICD-10-CM

## 2013-12-17 DIAGNOSIS — R059 Cough, unspecified: Secondary | ICD-10-CM

## 2013-12-17 DIAGNOSIS — R319 Hematuria, unspecified: Secondary | ICD-10-CM

## 2013-12-17 DIAGNOSIS — E049 Nontoxic goiter, unspecified: Secondary | ICD-10-CM

## 2013-12-17 LAB — TSH: TSH: 3.94 u[IU]/mL (ref 0.350–4.500)

## 2013-12-17 LAB — T4, FREE: Free T4: 1.21 ng/dL (ref 0.80–1.80)

## 2013-12-17 LAB — POCT CBC
GRANULOCYTE PERCENT: 68.7 % (ref 37–80)
HCT, POC: 41.8 % (ref 37.7–47.9)
Hemoglobin: 13.4 g/dL (ref 12.2–16.2)
LYMPH, POC: 2.9 (ref 0.6–3.4)
MCH, POC: 27.6 pg (ref 27–31.2)
MCHC: 32 g/dL (ref 31.8–35.4)
MCV: 86.3 fL (ref 80–97)
MID (cbc): 0.4 (ref 0–0.9)
MPV: 7.7 fL (ref 0–99.8)
POC Granulocyte: 7.4 — AB (ref 2–6.9)
POC LYMPH PERCENT: 27.2 %L (ref 10–50)
POC MID %: 4.1 %M (ref 0–12)
Platelet Count, POC: 337 10*3/uL (ref 142–424)
RBC: 4.84 M/uL (ref 4.04–5.48)
RDW, POC: 13.3 %
WBC: 10.8 10*3/uL — AB (ref 4.6–10.2)

## 2013-12-17 LAB — POCT URINALYSIS DIPSTICK
BILIRUBIN UA: NEGATIVE
Glucose, UA: 250
Ketones, UA: NEGATIVE
Leukocytes, UA: NEGATIVE
Nitrite, UA: NEGATIVE
PROTEIN UA: 30
Spec Grav, UA: 1.02
Urobilinogen, UA: 0.2
pH, UA: 6.5

## 2013-12-17 LAB — POCT UA - MICROSCOPIC ONLY
Casts, Ur, LPF, POC: NEGATIVE
Crystals, Ur, HPF, POC: NEGATIVE
YEAST UA: NEGATIVE

## 2013-12-17 LAB — GLUCOSE, POCT (MANUAL RESULT ENTRY): POC Glucose: 153 mg/dl — AB (ref 70–99)

## 2013-12-17 LAB — POCT URINE PREGNANCY: Preg Test, Ur: NEGATIVE

## 2013-12-17 MED ORDER — HYDROCODONE-HOMATROPINE 5-1.5 MG/5ML PO SYRP: 5.0000 mL | ORAL_SOLUTION | Freq: Three times a day (TID) | ORAL | Status: DC | PRN

## 2013-12-17 NOTE — Patient Instructions (Signed)
Ureteral Colic Ureteral colic is spasm-like pain from the kidney or the ureter. This is often caused by a kidney stone. The pain is caused by the stone trying to get through the tubes that pass your pee. HOME CARE   Drink enough fluids to keep your pee (urine) clear or pale yellow.  Strain all your pee. A strainer will be provided. Keep anything caught in the [place] and bring it to your doctor. The stone causing the pain may be very small.  Only take medicine as told by your doctor.  Follow up with your doctor as told. GET HELP RIGHT AWAY IF:   Pain is not controlled with medicine.  Pain continues or gets worse.  The pain changes and there is chest or belly (abdominal) pain.  You pass out (faint).  You cannot pee.  You keep throwing up (vomiting).  You have a temperature by mouth above 102 F (38.9 C), not controlled by medicine. MAKE SURE YOU:   Understand these instructions.  Will watch this condition.  Will get help right away if you are not doing well or get worse. Document Released: 06/10/2007 Document Revised: 03/16/2011 Document Reviewed: 06/10/2007 [facility] Patient Information 2015 [place], [place]. This information is not intended to replace advice given to you by your health care provider. Make sure you discuss any questions you have with your health care provider. Cough, Adult  A cough is a reflex that helps clear your throat and airways. It can help heal the body or may be a reaction to an irritated airway. A cough may only last 2 or 3 weeks (acute) or may last more than 8 weeks (chronic).  CAUSES Acute cough:  Viral or bacterial infections. Chronic cough:  Infections.  Allergies.  Asthma.  Post-nasal drip.  Smoking.  Heartburn or acid reflux.  Some medicines.  Chronic lung problems (COPD).  Cancer. SYMPTOMS   Cough.  Fever.  Chest pain.  Increased breathing rate.  High-pitched whistling sound when breathing (wheezing).  Colored  mucus that you cough up (sputum). TREATMENT   A bacterial cough may be treated with antibiotic medicine.  A viral cough must run its course and will not respond to antibiotics.  Your caregiver may recommend other treatments if you have a chronic cough. HOME CARE INSTRUCTIONS   Only take over-the-counter or prescription medicines for pain, discomfort, or fever as directed by your caregiver. Use cough suppressants only as directed by your caregiver.  Use a cold steam vaporizer or humidifier in your bedroom or home to help loosen secretions.  Sleep in a semi-upright position if your cough is worse at night.  Rest as needed.  Stop smoking if you smoke. SEEK IMMEDIATE MEDICAL CARE IF:   You have pus in your sputum.  Your cough starts to worsen.  You cannot control your cough with suppressants and are losing sleep.  You begin coughing up blood.  You have difficulty breathing.  You develop pain which is getting worse or is uncontrolled with medicine.  You have a fever. MAKE SURE YOU:   Understand these instructions.  Will watch your condition.  Will get help right away if you are not doing well or get worse. Document Released: 06/20/2010 Document Revised: 03/16/2011 Document Reviewed: 06/20/2010 [facility] Patient Information 2015 [place], [place]. This information is not intended to replace advice given to you by your health care provider. Make sure you discuss any questions you have with your health care provider.

## 2013-12-17 NOTE — Progress Notes (Addendum)
Subjective:  This chart was scribed for Arlyss Queen, MD by Mercy Moore, Medial Scribe. This patient was seen in room 10 and the patient's care was started at 9:28 AM.  Chief Complaint  Patient presents with  . Cough  . Back Pain    x 2 years     Patient ID: [name], female    DOB: 12-21-1970, 43 y.o.   MRN: [id number]  HPI HPI Comments: [name] is a 43 y.o. female who presents to the [facility] complaining of coughing ongoing for one week. Patient reports worsening at night when she is lying down; she denies the same intensity throughout the day. Patient reports that her coughing has been keeping her awake at night. Patient reports that she has been treating her symptoms with Nyquil, but denies relief. Patient denies heart burn, acid or belching, sore throat, chest pain, or wheezing. Patient denies possibility of pregnancy; IUD inserted.   Secondarily patient reports severe sharp right sided flank pain. Patient reports constant pain that is exacerbated with twisting/rotation at the hip. Patient reports long year history of similar pain. Patient denies direct injury or repetitive heavy lifting. Later, patient reveals history of kidney stones and four surgeries for removal. Patient's urologist is located here in [place].  Patient Active Problem List   Diagnosis Date Noted  . Pain in thoracic spine 06/22/2012  . Lumbago 06/22/2012   Past Medical History  Diagnosis Date  . Back pain   . History of kidney stones    Past Surgical History  Procedure Laterality Date  . Cholecystectomy, laparoscopic    . Cystoscopy with ureteroscopy and stent placement Bilateral 07/29/2012    Procedure: CYSTOSCOPY WITH BILATERAL URETEROSCOPY AND BILATERAL STENT PLACEMENT, BASKET EXTRACTION OF BILATERAL URETERAL STONES;  Surgeon: Alexis Frock, MD;  Location: [facility];  Service: Urology;  Laterality: Bilateral;   Allergies  Allergen Reactions  . Tramadol Hives, Itching  and Rash   Prior to Admission medications   Medication Sig Start Date End Date Taking? Authorizing Provider  HYDROcodone-acetaminophen (NORCO) 10-325 MG per tablet Take 1-2 tablets by mouth every 6 (six) hours as needed. Back pain and side pain Patient not taking: Reported on 12/17/2013 10/28/13   Robyn Haber, MD  potassium chloride SA (K-DUR,KLOR-CON) 20 MEQ tablet Take 1 tablet (20 mEq total) by mouth daily. Patient not taking: Reported on 12/17/2013 10/29/13   Robyn Haber, MD  senna-docusate (SENOKOT-S) 8.6-50 MG per tablet Take 1 tablet by mouth 2 (two) times daily. While taking pain meds to prevent constipation Patient not taking: Reported on 12/17/2013 07/29/12   Junious Dresser, MD  tiZANidine (ZANAFLEX) 4 MG tablet Take 4 mg by mouth every 8 (eight) hours as needed (muscle spasm).    Historical Provider, MD   History   Social History  . Marital Status: Married    Spouse Name: N/A    Number of Children: N/A  . Years of Education: N/A   Occupational History  . Not on file.   Social History Main Topics  . Smoking status: Never Smoker   . Smokeless tobacco: Never Used  . Alcohol Use: No     Comment: occ  . Drug Use: No     Comment: takes Hydrocodone for R Flank pain for past 3 years;  . Sexual Activity: Yes    Birth Control/ Protection: IUD   Other Topics Concern  . Not on file   Social History Narrative  Review of Systems  Constitutional: Negative for fever and chills.  HENT: Negative for sore throat.   Respiratory: Positive for cough.   Cardiovascular: Negative for chest pain.  Genitourinary: Positive for flank pain.  Musculoskeletal: Positive for back pain.       Objective:   Physical Exam  Nursing note and vitals reviewed.   CONSTITUTIONAL: Well developed/well nourished HEAD: Normocephalic/atraumatic EYES: EOMI/PERRL ENMT: Mucous membranes moist NECK: supple no meningeal signs; thyroid is symmetrically enlarged, no nodules  felt SPINE/BACK:entire spine nontender CV: S1/S2 noted, no murmurs/rubs/gallops noted LUNGS: Lungs are clear to auscultation bilaterally, no apparent distress ABDOMEN: soft, no rebound or guarding, bowel sounds noted throughout abdomen; tenderness lateral to right flank, no mass felt GU:no cva tenderness NEURO: Pt is awake/alert/appropriate, moves all extremitiesx4.  No facial droop.   EXTREMITIES: pulses normal/equal, full ROM SKIN: warm, color normal PSYCH: no abnormalities of mood noted, alert and oriented to situation  Filed Vitals:   12/17/13 0857  BP: 122/82  Pulse: 84  Temp: 97.7 F (36.5 C)  TempSrc: Oral  Resp: 16  Height: 5' (1.524 m)  Weight: 164 lb (74.39 kg)  SpO2: 98%   [facility] reading (PRIMARY) by  Dr.Kailynn Satterly there is evidence of previous granulomatous disease. There is a 1 cm nodule seen at the right perihilar area. Does this need a CT.Marland Kitchen  Results for orders placed or performed in visit on 12/17/13  POCT CBC  Result Value Ref Range   WBC 10.8 (A) 4.6 - 10.2 K/uL   Lymph, poc 2.9 0.6 - 3.4   POC LYMPH PERCENT 27.2 10 - 50 %L   MID (cbc) 0.4 0 - 0.9   POC MID % 4.1 0 - 12 %M   POC Granulocyte 7.4 (A) 2 - 6.9   Granulocyte percent 68.7 37 - 80 %G   RBC 4.84 4.04 - 5.48 M/uL   Hemoglobin 13.4 12.2 - 16.2 g/dL   HCT, POC 41.8 37.7 - 47.9 %   MCV 86.3 80 - 97 fL   MCH, POC 27.6 27 - 31.2 pg   MCHC 32.0 31.8 - 35.4 g/dL   RDW, POC 13.3 %   Platelet Count, POC 337 142 - 424 K/uL   MPV 7.7 0 - 99.8 fL  POCT urinalysis dipstick  Result Value Ref Range   Color, UA yellow    Clarity, UA clear    Glucose, UA 250    Bilirubin, UA neg    Ketones, UA neg    Spec Grav, UA 1.020    Blood, UA mod    pH, UA 6.5    Protein, UA 30    Urobilinogen, UA 0.2    Nitrite, UA neg    Leukocytes, UA Negative   POCT UA - Microscopic Only  Result Value Ref Range   WBC, Ur, HPF, POC 4-6    RBC, urine, microscopic 15-25    Bacteria, U Microscopic 1+    Mucus, UA 1+    Epithelial  cells, urine per micros 3-6    Crystals, Ur, HPF, POC neg    Casts, Ur, LPF, POC neg    Yeast, UA neg   [facility] reading (PRIMARY) by  Dr.Sinia Antosh KUB reveals 2 calcific densities present in the proximal ureter present on previous film.. IUD in place Results for orders placed or performed in visit on 12/17/13  POCT CBC  Result Value Ref Range   WBC 10.8 (A) 4.6 - 10.2 K/uL   Lymph, poc 2.9 0.6 - 3.4  POC LYMPH PERCENT 27.2 10 - 50 %L   MID (cbc) 0.4 0 - 0.9   POC MID % 4.1 0 - 12 %M   POC Granulocyte 7.4 (A) 2 - 6.9   Granulocyte percent 68.7 37 - 80 %G   RBC 4.84 4.04 - 5.48 M/uL   Hemoglobin 13.4 12.2 - 16.2 g/dL   HCT, POC 41.8 37.7 - 47.9 %   MCV 86.3 80 - 97 fL   MCH, POC 27.6 27 - 31.2 pg   MCHC 32.0 31.8 - 35.4 g/dL   RDW, POC 13.3 %   Platelet Count, POC 337 142 - 424 K/uL   MPV 7.7 0 - 99.8 fL  POCT urinalysis dipstick  Result Value Ref Range   Color, UA yellow    Clarity, UA clear    Glucose, UA 250    Bilirubin, UA neg    Ketones, UA neg    Spec Grav, UA 1.020    Blood, UA mod    pH, UA 6.5    Protein, UA 30    Urobilinogen, UA 0.2    Nitrite, UA neg    Leukocytes, UA Negative   POCT UA - Microscopic Only  Result Value Ref Range   WBC, Ur, HPF, POC 4-6    RBC, urine, microscopic 15-25    Bacteria, U Microscopic 1+    Mucus, UA 1+    Epithelial cells, urine per micros 3-6    Crystals, Ur, HPF, POC neg    Casts, Ur, LPF, POC neg    Yeast, UA neg   POCT glucose (manual entry)  Result Value Ref Range   POC Glucose 153 (A) 70 - 99 mg/dl  POCT urine pregnancy  Result Value Ref Range   Preg Test, Ur Negative       Assessment & Plan:  Patient given Hycodan at night for cough. I have referred her back to the urologist to reevaluate her for kidney stones.I personally performed the services described in this documentation, which was scribed in my presence. The recorded information has been reviewed and is accurate. Her random sugar is 153 which also says that she is  most likely diabetic. She is referred back to her PCP for evaluation of her thyromegaly and hyperglycemia. She is referred to [facility] for follow-up of her kidney stones and she will be treated here for her cough. Final report pending on her chest x-ray. Sugar was 153. She has a diagnosis of diabetes and is currently on metformin.  I personally performed the services described in this documentation, which was scribed in my presence. The recorded information has been reviewed and is accurate.

## 2013-12-18 ENCOUNTER — Encounter: Payer: Self-pay | Admitting: *Deleted

## 2013-12-18 LAB — URINE CULTURE
Colony Count: NO GROWTH
Organism ID, Bacteria: NO GROWTH

## 2013-12-19 ENCOUNTER — Encounter: Payer: Self-pay | Admitting: Radiology

## 2014-02-26 ENCOUNTER — Ambulatory Visit (INDEPENDENT_AMBULATORY_CARE_PROVIDER_SITE_OTHER): Payer: 59 | Admitting: Physician Assistant

## 2014-02-26 VITALS — BP 120/82 | HR 87 | Temp 98.4°F | Resp 18 | Ht 60.5 in | Wt 164.0 lb

## 2014-02-26 DIAGNOSIS — J302 Other seasonal allergic rhinitis: Secondary | ICD-10-CM

## 2014-02-26 DIAGNOSIS — H578 Other specified disorders of eye and adnexa: Secondary | ICD-10-CM

## 2014-02-26 DIAGNOSIS — H5789 Other specified disorders of eye and adnexa: Secondary | ICD-10-CM

## 2014-02-26 DIAGNOSIS — R059 Cough, unspecified: Secondary | ICD-10-CM

## 2014-02-26 DIAGNOSIS — R05 Cough: Secondary | ICD-10-CM

## 2014-02-26 DIAGNOSIS — H1013 Acute atopic conjunctivitis, bilateral: Secondary | ICD-10-CM

## 2014-02-26 LAB — GLUCOSE, POCT (MANUAL RESULT ENTRY): POC Glucose: 69 mg/dl — AB (ref 70–99)

## 2014-02-26 MED ORDER — HYDROCODONE-HOMATROPINE 5-1.5 MG/5ML PO SYRP
5.0000 mL | ORAL_SOLUTION | Freq: Three times a day (TID) | ORAL | Status: DC | PRN
Start: 2014-02-26 — End: 2014-03-20

## 2014-02-26 MED ORDER — OLOPATADINE HCL 0.1 % OP SOLN: 1.0000 [drp] | Freq: Two times a day (BID) | OPHTHALMIC | Status: DC

## 2014-02-26 MED ORDER — CETIRIZINE HCL 10 MG PO TABS: 10.0000 mg | ORAL_TABLET | Freq: Every day | ORAL | Status: DC

## 2014-02-26 NOTE — Progress Notes (Signed)
02/26/2014 at 7:48 PM  [name] / DOB: [birth year] / MRN: [id number]  The patient has Pain in thoracic spine and Lumbago on her problem list.  SUBJECTIVE  Chief compalaint: Cough and watery, itchy eyes   History of present illness: Ms. [name] is 44 y.o. well appearing female presenting for stable moderate dry cough that has been going on for about 1 week. Associated symptoms include stuffy nose, eye itching and redness.  She is a borderline diabetic for roughly 1 year and has been taking metformin and she has also been exercising on a regular basis. She also complains of bilateral red eyes    She  has a past medical history of Back pain and History of kidney stones.    She has a current medication list which includes the following prescription(s): tizanidine.  Ms. [name] is allergic to tramadol. She  reports that she has never smoked. She has never used smokeless tobacco. She reports that she does not drink alcohol or use illicit drugs. She  reports that she currently engages in sexual activity. She reports using the following method of birth control/protection: IUD. The patient  has past surgical history that includes Cholecystectomy, laparoscopic and Cystoscopy with ureteroscopy and stent placement (Bilateral, 07/29/2012).  Her family history is negative for Other.  Review of Systems  Constitutional: Negative for fever, chills and weight loss.  Eyes: Positive for redness. Negative for blurred vision, double vision, photophobia and pain.  Cardiovascular: Negative for chest pain and palpitations.  Musculoskeletal: Negative for myalgias.  Neurological: Negative for dizziness and headaches.  Psychiatric/Behavioral: Negative for depression.    OBJECTIVE  Her  height is 5' 0.5" (1.537 m) and weight is 164 lb (74.39 kg). Her oral temperature is 98.4 F (36.9 C). Her blood pressure is 120/82 and her pulse is 87. Her respiration is 18 and oxygen saturation is 98%.  The patient's body mass index  is 31.49 kg/(m^2).  Physical Exam  HENT:  Right Ear: Hearing, tympanic membrane, external ear and ear canal normal.  Left Ear: Hearing, tympanic membrane, external ear and ear canal normal.  Nose: Mucosal edema present. Right sinus exhibits no maxillary sinus tenderness and no frontal sinus tenderness. Left sinus exhibits no maxillary sinus tenderness and no frontal sinus tenderness.  Mouth/Throat: Uvula is midline, oropharynx is clear and moist and mucous membranes are normal.  Cardiovascular: Normal heart sounds.   Respiratory: Effort normal and breath sounds normal. She has no decreased breath sounds. She has no wheezes. She has no rhonchi.    Results for orders placed or performed in visit on 02/26/14 (from the past 24 hour(s))  POCT glucose (manual entry)     Status: Abnormal   Collection Time: 02/26/14  4:46 PM  Result Value Ref Range   POC Glucose 69 (A) 70 - 99 mg/dl    Vision Screening: 20/20 bilaterally.   ASSESSMENT & PLAN  [name] was seen today for cough and watery, itchy eyes.  Diagnoses and all orders for this visit:  Allergic conjunctivitis, bilateral Orders: -     olopatadine (PATANOL) 0.1 % ophthalmic solution; Place 1 drop into both eyes 2 (two) times daily.  Eye redness Orders: -     POCT glucose (manual entry)  Seasonal allergies Orders: -     cetirizine (ZYRTEC) 10 MG tablet; Take 1 tablet (10 mg total) by mouth daily.  Cough Orders: -     HYDROcodone-homatropine (HYCODAN) 5-1.5 MG/5ML syrup; Take 5 mLs by mouth every 8 (eight) hours  as needed for cough.    The patient was advised to call or come back to clinic if she does not see an improvement in symptoms, or worsens with the above plan.   Philis Fendt, MHS, PA-C [facility] 02/26/2014 7:48 PM

## 2014-03-12 ENCOUNTER — Other Ambulatory Visit: Payer: Self-pay | Admitting: Urology

## 2014-03-16 ENCOUNTER — Encounter (HOSPITAL_COMMUNITY): Payer: Self-pay | Admitting: *Deleted

## 2014-03-20 ENCOUNTER — Encounter (HOSPITAL_COMMUNITY): Payer: Self-pay | Admitting: *Deleted

## 2014-03-20 ENCOUNTER — Inpatient Hospital Stay (HOSPITAL_COMMUNITY)
Admission: AD | Admit: 2014-03-20 | Discharge: 2014-03-20 | Disposition: A | Discharge status: Home / Self Care | Payer: 59 | Source: Ambulatory Visit | Attending: Obstetrics & Gynecology | Admitting: Obstetrics & Gynecology

## 2014-03-20 DIAGNOSIS — T839XXA Unspecified complication of genitourinary prosthetic device, implant and graft, initial encounter: Secondary | ICD-10-CM | POA: Diagnosis not present

## 2014-03-20 DIAGNOSIS — R1031 Right lower quadrant pain: Secondary | ICD-10-CM | POA: Diagnosis present

## 2014-03-20 DIAGNOSIS — Z87442 Personal history of urinary calculi: Secondary | ICD-10-CM | POA: Diagnosis not present

## 2014-03-20 DIAGNOSIS — T8389XA Other specified complication of genitourinary prosthetic devices, implants and grafts, initial encounter: Secondary | ICD-10-CM

## 2014-03-20 DIAGNOSIS — N200 Calculus of kidney: Secondary | ICD-10-CM | POA: Diagnosis not present

## 2014-03-20 DIAGNOSIS — N189 Chronic kidney disease, unspecified: Secondary | ICD-10-CM | POA: Diagnosis not present

## 2014-03-20 HISTORY — DX: Chronic kidney disease, unspecified: N18.9

## 2014-03-20 LAB — CBC
HEMATOCRIT: 39.4 % (ref 36.0–46.0)
HEMOGLOBIN: 12.9 g/dL (ref 12.0–15.0)
MCH: 27.8 pg (ref 26.0–34.0)
MCHC: 32.7 g/dL (ref 30.0–36.0)
MCV: 84.9 fL (ref 78.0–100.0)
PLATELETS: 303 10*3/uL (ref 150–400)
RBC: 4.64 MIL/uL (ref 3.87–5.11)
RDW: 12.9 % (ref 11.5–15.5)
WBC: 13.5 10*3/uL — AB (ref 4.0–10.5)

## 2014-03-20 LAB — URINALYSIS, ROUTINE W REFLEX MICROSCOPIC
BILIRUBIN URINE: NEGATIVE
Glucose, UA: NEGATIVE mg/dL
KETONES UR: NEGATIVE mg/dL
LEUKOCYTES UA: NEGATIVE
NITRITE: NEGATIVE
PROTEIN: 30 mg/dL — AB
Specific Gravity, Urine: 1.025 (ref 1.005–1.030)
Urobilinogen, UA: 0.2 mg/dL (ref 0.0–1.0)
pH: 6.5 (ref 5.0–8.0)

## 2014-03-20 LAB — POCT PREGNANCY, URINE: Preg Test, Ur: NEGATIVE

## 2014-03-20 LAB — URINE MICROSCOPIC-ADD ON

## 2014-03-20 LAB — WET PREP, GENITAL
CLUE CELLS WET PREP: NONE SEEN
TRICH WET PREP: NONE SEEN
Yeast Wet Prep HPF POC: NONE SEEN

## 2014-03-20 MED ORDER — OXYCODONE-ACETAMINOPHEN 5-325 MG PO TABS
2.0000 | ORAL_TABLET | Freq: Once | ORAL | Status: AC
  Administered 2014-03-20: 2 via ORAL
  Filled 2014-03-20: qty 2

## 2014-03-20 MED ORDER — OXYCODONE-ACETAMINOPHEN 5-325 MG PO TABS: 1.0000 | ORAL_TABLET | Freq: Four times a day (QID) | ORAL | Status: DC | PRN

## 2014-03-20 NOTE — MAU Note (Signed)
Pt reports sharp in right lower abd that radiates to right lower back for the last 3 days, worsening over the last 2 days. Also reports blood in her urine yesterday.

## 2014-03-20 NOTE — Discharge Instructions (Signed)
Do not take any NSAIDS (Aleve, Ibuprofen) before your procedure.   Kidney Stones Kidney stones (urolithiasis) are deposits that form inside your kidneys. The intense pain is caused by the stone moving through the urinary tract. When the stone moves, the ureter goes into spasm around the stone. The stone is usually passed in the urine.  CAUSES   A disorder that makes certain neck glands produce too much parathyroid hormone (primary hyperparathyroidism).  A buildup of uric acid crystals, similar to gout in your joints.  Narrowing (stricture) of the ureter.  A kidney obstruction present at birth (congenital obstruction).  Previous surgery on the kidney or ureters.  Numerous kidney infections. SYMPTOMS   Feeling sick to your stomach (nauseous).  Throwing up (vomiting).  Blood in the urine (hematuria).  Pain that usually spreads (radiates) to the groin.  Frequency or urgency of urination. DIAGNOSIS   Taking a history and physical exam.  Blood or urine tests.  CT scan.  Occasionally, an examination of the inside of the urinary bladder (cystoscopy) is performed. TREATMENT   Observation.  Increasing your fluid intake.  Extracorporeal shock wave lithotripsy--This is a noninvasive procedure that uses shock waves to break up kidney stones.  Surgery may be needed if you have severe pain or persistent obstruction. There are various surgical procedures. Most of the procedures are performed with the use of small instruments. Only small incisions are needed to accommodate these instruments, so recovery time is minimized. The size, location, and chemical composition are all important variables that will determine the proper choice of action for you. Talk to your health care provider to better understand your situation so that you will minimize the risk of injury to yourself and your kidney.  HOME CARE INSTRUCTIONS   Drink enough water and fluids to keep your urine clear or pale  yellow. This will help you to pass the stone or stone fragments.  Strain all urine through the provided strainer. Keep all particulate matter and stones for your health care provider to see. The stone causing the pain may be as small as a grain of salt. It is very important to use the strainer each and every time you pass your urine. The collection of your stone will allow your health care provider to analyze it and verify that a stone has actually passed. The stone analysis will often identify what you can do to reduce the incidence of recurrences.  Only take over-the-counter or prescription medicines for pain, discomfort, or fever as directed by your health care provider.  Make a follow-up appointment with your health care provider as directed.  Get follow-up X-rays if required. The absence of pain does not always mean that the stone has passed. It may have only stopped moving. If the urine remains completely obstructed, it can cause loss of kidney function or even complete destruction of the kidney. It is your responsibility to make sure X-rays and follow-ups are completed. Ultrasounds of the kidney can show blockages and the status of the kidney. Ultrasounds are not associated with any radiation and can be performed easily in a matter of minutes. SEEK MEDICAL CARE IF:  You experience pain that is progressive and unresponsive to any pain medicine you have been prescribed. SEEK IMMEDIATE MEDICAL CARE IF:   Pain cannot be controlled with the prescribed medicine.  You have a fever or shaking chills.  The severity or intensity of pain increases over 18 hours and is not relieved by pain medicine.  You develop a  new onset of abdominal pain.  You feel faint or pass out.  You are unable to urinate. MAKE SURE YOU:   Understand these instructions.  Will watch your condition.  Will get help right away if you are not doing well or get worse. Document Released: 12/22/2004 Document Revised:  08/24/2012 Document Reviewed: 05/25/2012 [facility] Patient Information 2015 [place], [place]. This information is not intended to replace advice given to you by your health care provider. Make sure you discuss any questions you have with your health care provider.   Lithotripsy for Kidney Stones Lithotripsy is a treatment that can sometimes help eliminate kidney stones and pain that they cause. A form of lithotripsy, also known as extracorporeal shock wave lithotripsy, is a nonsurgical procedure that helps your body rid itself of the kidney stone when it is too big to pass on its own. Extracorporeal shock wave lithotripsy is a method of crushing a kidney stone with shock waves. These shock waves pass through your body and are focused on your stone. They cause the kidney stones to crumble while still in the urinary tract. It is then easier for the smaller pieces of stone to pass in the urine. Lithotripsy usually takes about an hour. It is done in a hospital, a lithotripsy center, or a mobile unit. It usually does not require an overnight stay. Your health care provider will instruct you on preparation for the procedure. Your health care provider will tell you what to expect afterward. LET [facility] CARE PROVIDER KNOW ABOUT:  Any allergies you have.  All medicines you are taking, including vitamins, herbs, eye drops, creams, and over-the-counter medicines.  Previous problems you or members of your family have had with the use of anesthetics.  Any blood disorders you have.  Previous surgeries you have had.  Medical conditions you have. RISKS AND COMPLICATIONS Generally, lithotripsy for kidney stones is a safe procedure. However, as with any procedure, complications can occur. Possible complications include:  Infection.  Bleeding of the kidney.  Bruising of the kidney or skin.  Obstruction of the ureter.  Failure of the stone to fragment. BEFORE THE PROCEDURE  Do not eat or drink for  6-8 hours prior to the procedure. You may, however, take the medications with a sip of water that your physician instructs you to take  Do not take aspirin or aspirin-containing products for 7 days prior to your procedure  Do not take nonsteroidal anti-inflammatory products for 7 days prior to your procedure PROCEDURE A stent (flexible tube with holes) may be placed in your ureter. The ureter is the tube that transports the urine from the kidneys to the bladder. Your health care provider may place a stent before the procedure. This will help keep urine flowing from the kidney if the fragments of the stone block the ureter. You may have an IV tube placed in one of your veins to give you fluids and medicines. These medicines may help you relax or make you sleep. During the procedure, you will lie comfortably on a fluid-filled cushion or in a warm-water bath. After an X-ray or ultrasound exam to locate your stone, shock waves are aimed at the stone. If you are awake, you may feel a tapping sensation as the shock waves pass through your body. If large stone particles remain after treatment, a second procedure may be necessary at a later date. For comfort during the test:  Relax as much as possible.  Try to remain still as much as possible.  Try to follow instructions to speed up the test.  Let your health care provider know if you are uncomfortable, anxious, or in pain. AFTER THE PROCEDURE  After surgery, you will be taken to the recovery area. A nurse will watch and check your progress. Once you're awake, stable, and taking fluids well, you will be allowed to go home as long as there are no problems. You will also be allowed to pass your urine before discharge.You may be given antibiotics to help prevent infection. You may also be prescribed pain medicine if needed. In a week or two, your health care provider may remove your stent, if you have one. You may first have an X-ray exam to check on how  successful the fragmentation of your stone has been and how much of the stone has passed. Your health care provider will check to see whether or not stone particles remain. SEEK IMMEDIATE MEDICAL CARE IF:  You develop a fever or shaking chills.  Your pain is not relieved by medicine.  You feel sick to your stomach (nauseated) and you vomit.  You develop heavy bleeding.  You have difficulty urinating.  You start to pass your stent from your penis. Document Released: 12/20/1999 Document Revised: 10/12/2012 Document Reviewed: 07/07/2012 [facility] Patient Information 2015 [place], [place]. This information is not intended to replace advice given to you by your health care provider. Make sure you discuss any questions you have with your health care provider.

## 2014-03-20 NOTE — MAU Provider Note (Signed)
Chief Complaint: No chief complaint on file.  First Provider Initiated Contact with Patient 03/20/14 2155     SUBJECTIVE HPI: AEMILIA Villanueva is a 44 y.o. [phone] female who presents with sharp, intermittent RLQ/groin pain x 3 days (since 03/18/14) and hematuria since yesterday. Has bee Dx'd w/ Right kidney stones and has lithotripsy scheduled 03/22/14 with Dr. Tresa Moore, urologist. Pain feels similar to kidney stone pain, but has never been in her low abd/groin area.  Has Mirena IUD. Placed by Dr. Ihor Dow in 2014.  Past Medical History  Diagnosis Date  . Back pain   . History of kidney stones   . Chronic kidney disease     kidney stones   OB History  Gravida Para Term Preterm AB SAB TAB Ectopic Multiple Living  4 1 1  3  3   1     # Outcome Date GA Lbr Len/2nd Weight Sex Delivery Anes PTL Lv  4 TAB           3 TAB           2 TAB           1 Term              Past Surgical History  Procedure Laterality Date  . Cholecystectomy, laparoscopic    . Cystoscopy with ureteroscopy and stent placement Bilateral 07/29/2012    Procedure: CYSTOSCOPY WITH BILATERAL URETEROSCOPY AND BILATERAL STENT PLACEMENT, BASKET EXTRACTION OF BILATERAL URETERAL STONES;  Surgeon: Alexis Frock, MD;  Location: [facility];  Service: Urology;  Laterality: Bilateral;   History   Social History  . Marital Status: Married    Spouse Name: N/A  . Number of Children: N/A  . Years of Education: N/A   Occupational History  . Not on file.   Social History Main Topics  . Smoking status: Never Smoker   . Smokeless tobacco: Never Used  . Alcohol Use: No     Comment: occ  . Drug Use: No     Comment: takes Hydrocodone for R Flank pain for past 3 years;  . Sexual Activity: Yes    Birth Control/ Protection: IUD   Other Topics Concern  . Not on file   Social History Narrative   No current facility-administered medications on file prior to encounter.   Current Outpatient Prescriptions on File Prior to  Encounter  Medication Sig Dispense Refill  . cetirizine (ZYRTEC) 10 MG tablet Take 1 tablet (10 mg total) by mouth daily. 30 tablet 3  . naproxen sodium (ANAPROX) 220 MG tablet Take 880 mg by mouth 2 (two) times daily as needed (For pain and headache.).     . HYDROcodone-homatropine (HYCODAN) 5-1.5 MG/5ML syrup Take 5 mLs by mouth every 8 (eight) hours as needed for cough. (Patient not taking: Reported on 03/12/2014) 120 mL 0  . olopatadine (PATANOL) 0.1 % ophthalmic solution Place 1 drop into both eyes 2 (two) times daily. (Patient not taking: Reported on 03/12/2014) 5 mL 12   Allergies  Allergen Reactions  . Tramadol Hives, Itching and Rash  Review of Systems  Constitutional: Negative for fever and chills.  Gastrointestinal: Positive for nausea and abdominal pain. Negative for vomiting, diarrhea and constipation.  Genitourinary: Positive for dysuria and hematuria. Negative for urgency, frequency and flank pain.  Musculoskeletal: Negative for back pain.   OBJECTIVE Blood pressure 116/88, pulse 80, temperature 99.1 F (37.3 C), temperature source Oral, resp. rate 18, height 4\' 11"  (1.499 m), weight 73.483 kg (162 lb), SpO2  98 %. GENERAL: Well-developed, well-nourished, obese female in mild-moderate distress.  HEENT: Normocephalic HEART: normal rate RESP: normal effort ABDOMEN: Soft, non-tender. Positive bowel sounds 4. No masses. Mild right CVAT.  EXTREMITIES: Nontender, no edema NEURO: Alert and oriented SPECULUM EXAM: NEFG, physiologic discharge, no blood noted, cervix clean. IUD string not seen w/ certainty--possibly seen in cervix.  BIMANUAL: cervix closed; uterus normal size, no adnexal tenderness or masses. No CMT.   LAB RESULTS Results for orders placed or performed during the hospital encounter of 03/20/14 (from the past 24 hour(s))  Urinalysis, Routine w reflex microscopic     Status: Abnormal   Collection Time: 03/20/14  7:48 PM  Result Value Ref Range   Color, Urine YELLOW  YELLOW   APPearance HAZY (A) CLEAR   Specific Gravity, Urine 1.025 1.005 - 1.030   pH 6.5 5.0 - 8.0   Glucose, UA NEGATIVE NEGATIVE mg/dL   Hgb urine dipstick LARGE (A) NEGATIVE   Bilirubin Urine NEGATIVE NEGATIVE   Ketones, ur NEGATIVE NEGATIVE mg/dL   Protein, ur 30 (A) NEGATIVE mg/dL   Urobilinogen, UA 0.2 0.0 - 1.0 mg/dL   Nitrite NEGATIVE NEGATIVE   Leukocytes, UA NEGATIVE NEGATIVE  Urine microscopic-add on     Status: Abnormal   Collection Time: 03/20/14  7:48 PM  Result Value Ref Range   Squamous Epithelial / LPF FEW (A) RARE   WBC, UA 0-2 <3 WBC/hpf   RBC / HPF 11-20 <3 RBC/hpf   Bacteria, UA RARE RARE   Urine-Other MUCOUS PRESENT   Pregnancy, urine POC     Status: None   Collection Time: 03/20/14  7:55 PM  Result Value Ref Range   Preg Test, Ur NEGATIVE NEGATIVE  CBC     Status: Abnormal   Collection Time: 03/20/14  9:20 PM  Result Value Ref Range   WBC 13.5 (H) 4.0 - 10.5 K/uL   RBC 4.64 3.87 - 5.11 MIL/uL   Hemoglobin 12.9 12.0 - 15.0 g/dL   HCT 39.4 36.0 - 46.0 %   MCV 84.9 78.0 - 100.0 fL   MCH 27.8 26.0 - 34.0 pg   MCHC 32.7 30.0 - 36.0 g/dL   RDW 12.9 11.5 - 15.5 %   Platelets 303 150 - 400 K/uL  Wet prep, genital     Status: Abnormal   Collection Time: 03/20/14  9:55 PM  Result Value Ref Range   Yeast Wet Prep HPF POC NONE SEEN NONE SEEN   Trich, Wet Prep NONE SEEN NONE SEEN   Clue Cells Wet Prep HPF POC NONE SEEN NONE SEEN   WBC, Wet Prep HPF POC MODERATE (A) NONE SEEN    IMAGING No results found.  MAU COURSE CBC, UA, urine culture, Percocet.   Consulted Dr. Tresa Moore, urologist regarding imaging for this patient. He does not feel that she needs any imaging repeated at this time. Symptoms are consistent with kidney stone that may be moving down.  Low suspicion for appendicitis due to location of pain and absence of fever or abdominal tenderness. Discussed with Dr. Gala Romney who agrees with plan of care.   ASSESSMENT 1. Kidney stone on right side    2. IUD complication, initial encounter     PLAN Discharge home in stable condition per consult with Dr. Tammi Klippel and Dr. Gala Romney. Appendicitis precautions. Patient instructed to not take any NSAIDs before lithotripsy or she risks having to reschedule it. Outpatient ultrasound ordered to assess IUD position. In basket message sent to Dr. Ihor Dow requested that she  review the results and call patient.     Follow-up Information    Follow up with Alexis Frock, MD.   Specialty:  Urology   Why:  As scheduled   Contact information:   [place] [address] [phone]       Follow up with [facility].   Why:  As needed in emergencies   Contact information:   [place] [phone]        Medication List    STOP taking these medications        HYDROcodone-homatropine 5-1.5 MG/5ML syrup  Commonly known as:  HYCODAN     naproxen sodium 220 MG tablet  Commonly known as:  ANAPROX     olopatadine 0.1 % ophthalmic solution  Commonly known as:  PATANOL      TAKE these medications        cetirizine 10 MG tablet  Commonly known as:  ZYRTEC  Take 1 tablet (10 mg total) by mouth daily.     levonorgestrel 20 MCG/24HR IUD  Commonly known as:  MIRENA  1 each by Intrauterine route once.     oxyCODONE-acetaminophen 5-325 MG per tablet  Commonly known as:  PERCOCET/ROXICET  Take 1-2 tablets by mouth every 6 (six) hours as needed.     promethazine-dextromethorphan 6.25-15 MG/5ML syrup  Commonly known as:  PROMETHAZINE-DM  Take 5 mLs by mouth 4 (four) times daily as needed for cough.         [place], [place] 03/20/2014  11:07 PM

## 2014-03-21 LAB — GC/CHLAMYDIA PROBE AMP (~~LOC~~) NOT AT ARMC
Chlamydia: NEGATIVE
Neisseria Gonorrhea: NEGATIVE

## 2014-03-21 MED ORDER — GENTAMICIN SULFATE 40 MG/ML IJ SOLN
280.0000 mg | INTRAVENOUS | Status: AC
  Administered 2014-03-22: 280 mg via INTRAVENOUS
  Filled 2014-03-21: qty 7

## 2014-03-22 ENCOUNTER — Encounter (HOSPITAL_COMMUNITY)
Admission: RE | Disposition: A | Discharge status: Home / Self Care | Payer: Self-pay | Source: Ambulatory Visit | Attending: Urology

## 2014-03-22 ENCOUNTER — Ambulatory Visit (HOSPITAL_COMMUNITY)
Admission: RE | Admit: 2014-03-22 | Discharge: 2014-03-22 | Disposition: A | Discharge status: Home / Self Care | Payer: 59 | Source: Ambulatory Visit | Attending: Urology | Admitting: Urology

## 2014-03-22 ENCOUNTER — Ambulatory Visit (HOSPITAL_COMMUNITY): Payer: 59

## 2014-03-22 ENCOUNTER — Encounter (HOSPITAL_COMMUNITY): Payer: Self-pay | Admitting: General Practice

## 2014-03-22 DIAGNOSIS — Z87442 Personal history of urinary calculi: Secondary | ICD-10-CM | POA: Insufficient documentation

## 2014-03-22 DIAGNOSIS — Z9049 Acquired absence of other specified parts of digestive tract: Secondary | ICD-10-CM | POA: Diagnosis not present

## 2014-03-22 DIAGNOSIS — Z888 Allergy status to other drugs, medicaments and biological substances status: Secondary | ICD-10-CM | POA: Diagnosis not present

## 2014-03-22 DIAGNOSIS — N201 Calculus of ureter: Secondary | ICD-10-CM | POA: Insufficient documentation

## 2014-03-22 DIAGNOSIS — M549 Dorsalgia, unspecified: Secondary | ICD-10-CM | POA: Insufficient documentation

## 2014-03-22 DIAGNOSIS — N189 Chronic kidney disease, unspecified: Secondary | ICD-10-CM | POA: Diagnosis not present

## 2014-03-22 DIAGNOSIS — G8929 Other chronic pain: Secondary | ICD-10-CM | POA: Diagnosis not present

## 2014-03-22 LAB — BASIC METABOLIC PANEL
ANION GAP: 7 (ref 5–15)
BUN: 15 mg/dL (ref 6–23)
CO2: 25 mmol/L (ref 19–32)
CREATININE: 0.85 mg/dL (ref 0.50–1.10)
Calcium: 8.8 mg/dL (ref 8.4–10.5)
Chloride: 104 mmol/L (ref 96–112)
GFR calc Af Amer: >90 mL/min (ref >90)
GFR calc non Af Amer: 82 mL/min — ABNORMAL LOW (ref >90)
Glucose, Bld: 114 mg/dL — ABNORMAL HIGH (ref 70–99)
Potassium: 3.3 mmol/L — ABNORMAL LOW (ref 3.5–5.1)
Sodium: 136 mmol/L (ref 135–145)

## 2014-03-22 LAB — URINE CULTURE
Colony Count: 50000
Special Requests: NORMAL

## 2014-03-22 SURGERY — LITHOTRIPSY, ESWL
Anesthesia: LOCAL | Laterality: Right

## 2014-03-22 MED ORDER — SENNOSIDES-DOCUSATE SODIUM 8.6-50 MG PO TABS: 1.0000 | ORAL_TABLET | Freq: Two times a day (BID) | ORAL | Status: DC

## 2014-03-22 MED ORDER — SODIUM CHLORIDE 0.9 % IV SOLN
INTRAVENOUS | Status: DC
  Administered 2014-03-22: 11:00:00 via INTRAVENOUS

## 2014-03-22 MED ORDER — DIAZEPAM 5 MG PO TABS
10.0000 mg | ORAL_TABLET | ORAL | Status: AC
  Administered 2014-03-22: 10 mg via ORAL
  Filled 2014-03-22: qty 2

## 2014-03-22 MED ORDER — OXYCODONE-ACETAMINOPHEN 5-325 MG PO TABS: 1.0000 | ORAL_TABLET | Freq: Four times a day (QID) | ORAL | Status: DC | PRN

## 2014-03-22 MED ORDER — DIPHENHYDRAMINE HCL 25 MG PO CAPS
25.0000 mg | ORAL_CAPSULE | ORAL | Status: AC
Start: 2014-03-22 — End: 2014-03-22
  Administered 2014-03-22: 25 mg via ORAL
  Filled 2014-03-22: qty 1

## 2014-03-22 MED ORDER — ONDANSETRON HCL 4 MG/2ML IJ SOLN
4.0000 mg | Freq: Once | INTRAMUSCULAR | Status: AC
  Administered 2014-03-22: 4 mg via INTRAVENOUS
  Filled 2014-03-22: qty 2

## 2014-03-22 NOTE — H&P (Signed)
[name] is an 44 y.o. female.    Chief Complaint: Pre-OP Shockwave Lithotripsy  HPI:   1 - Recurrent Nephrolithiasis -  Pre 2014 - single episode colic managed medically 07/2012 - Bilateral URS to stone free, composition 80% CaOx / 20% CaPO4 11/2013 - Multifocal Rt UPJ stone by KUB at Urgent Care, no interval passage, repeat KUB 03/09/14 - approx 3X 33mm stones rt mid ureter.   2 - Rt Back Pain - Pt with 5 years + of chronic back pain for which she takes narcotics. Had eval by neurologist and others prior. Pain is constant, non-coliky. No relation go full/empty blader. On exam she has focal TTP T12 area and along Rt T12 dermatome.   PMH sig for chronic pain (narcotics), lap chole  Today "[name]" is seen to proceed with right shockwave lithotripsy. She had interval ER visit for some hematuria likely related to stone. UCX at that time non-specific non-clonal low growth c/w contamination, no fevers.   Past Medical History  Diagnosis Date  . Back pain   . History of kidney stones   . Chronic kidney disease     kidney stones    Past Surgical History  Procedure Laterality Date  . Cholecystectomy, laparoscopic    . Cystoscopy with ureteroscopy and stent placement Bilateral 07/29/2012    Procedure: CYSTOSCOPY WITH BILATERAL URETEROSCOPY AND BILATERAL STENT PLACEMENT, BASKET EXTRACTION OF BILATERAL URETERAL STONES;  Surgeon: Debra Frock, MD;  Location: [facility];  Service: Urology;  Laterality: Bilateral;    Family History  Problem Relation Age of Onset  . Other Neg Hx    Social History:  reports that she has never smoked. She has never used smokeless tobacco. She reports that she does not drink alcohol or use illicit drugs.  Allergies:  Allergies  Allergen Reactions  . Tramadol Hives, Itching and Rash    No prescriptions prior to admission    Results for orders placed or performed during the hospital encounter of 03/20/14 (from the past 48 hour(s))  Urinalysis, Routine w  reflex microscopic     Status: Abnormal   Collection Time: 03/20/14  7:48 PM  Result Value Ref Range   Color, Urine YELLOW YELLOW   APPearance HAZY (A) CLEAR   Specific Gravity, Urine 1.025 1.005 - 1.030   pH 6.5 5.0 - 8.0   Glucose, UA NEGATIVE NEGATIVE mg/dL   Hgb urine dipstick LARGE (A) NEGATIVE   Bilirubin Urine NEGATIVE NEGATIVE   Ketones, ur NEGATIVE NEGATIVE mg/dL   Protein, ur 30 (A) NEGATIVE mg/dL   Urobilinogen, UA 0.2 0.0 - 1.0 mg/dL   Nitrite NEGATIVE NEGATIVE   Leukocytes, UA NEGATIVE NEGATIVE  Urine microscopic-add on     Status: Abnormal   Collection Time: 03/20/14  7:48 PM  Result Value Ref Range   Squamous Epithelial / LPF FEW (A) RARE   WBC, UA 0-2 <3 WBC/hpf   RBC / HPF 11-20 <3 RBC/hpf   Bacteria, UA RARE RARE   Urine-Other MUCOUS PRESENT   Urine culture     Status: None   Collection Time: 03/20/14  7:48 PM  Result Value Ref Range   Specimen Description URINE, CLEAN CATCH    Special Requests Normal    Colony Count      50,000 COLONIES/ML Performed at [facility]    Culture      Multiple bacterial morphotypes present, none predominant. Suggest appropriate recollection if clinically indicated. Performed at [facility]    Report Status 03/22/2014 FINAL  Pregnancy, urine POC     Status: None   Collection Time: 03/20/14  7:55 PM  Result Value Ref Range   Preg Test, Ur NEGATIVE NEGATIVE    Comment:        THE SENSITIVITY OF THIS METHODOLOGY IS >24 mIU/mL   CBC     Status: Abnormal   Collection Time: 03/20/14  9:20 PM  Result Value Ref Range   WBC 13.5 (H) 4.0 - 10.5 K/uL   RBC 4.64 3.87 - 5.11 MIL/uL   Hemoglobin 12.9 12.0 - 15.0 g/dL   HCT 39.4 36.0 - 46.0 %   MCV 84.9 78.0 - 100.0 fL   MCH 27.8 26.0 - 34.0 pg   MCHC 32.7 30.0 - 36.0 g/dL   RDW 12.9 11.5 - 15.5 %   Platelets 303 150 - 400 K/uL  Wet prep, genital     Status: Abnormal   Collection Time: 03/20/14  9:55 PM  Result Value Ref Range   Yeast Wet Prep HPF POC NONE  SEEN NONE SEEN   Trich, Wet Prep NONE SEEN NONE SEEN   Clue Cells Wet Prep HPF POC NONE SEEN NONE SEEN   WBC, Wet Prep HPF POC MODERATE (A) NONE SEEN    Comment: MODERATE BACTERIA SEEN   No results found.  ROS  There were no vitals taken for this visit. Physical Exam   Assessment/Plan   1 - Nephrolithiasis - Persistent fairly large volume rt ureteral stone by KUB. She did not tollerate URS well previously and wants to try SWL instead.   We rediscussed shockwave lithotripsy in detail as well as my "rule of 9s" with stones <6mm, less than 900 HU, and skin to stone distance <9cm having approximately 90% treatment success with single session of treatment. We then readdressed how stones that are larger, more dense, and in patients with less favorable anatomy have incrementally decreased success rates. We rediscussed risks including, bleeding, infection, hematoma, loss of kidney, need for staged therapy, need for adjunctive therapy and requirement to refrain from any anticoagulants, anti-platelet or aspirin-like products peri-procedureally.   After careful consideration, the patient has chosen to proceed today as planned.    2 - Rt Flank Pain - I again explained that based on new imaging  stones werre in my opinion NOT the primary source of her pain, but that they may be contributory. She is a wits end and adamantly wants stones removed. I can certainly understand her situation and this is reasonable.  [name] 03/22/2014, 7:58 AM

## 2014-03-22 NOTE — Brief Op Note (Signed)
03/22/2014  1:33 PM  PATIENT:  Keziah S Sukhu  44 y.o. female  PRE-OPERATIVE DIAGNOSIS:  RIGHT URETERAL STONE  POST-OPERATIVE DIAGNOSIS:  * No post-op diagnosis entered *  PROCEDURE:  Procedure(s): RIGHT EXTRACORPOREAL SHOCK WAVE LITHOTRIPSY (ESWL) (Right)  SURGEON:  Surgeon(s) and Role:    * Alexis Frock, MD - Primary  PHYSICIAN ASSISTANT:   ASSISTANTS: none   ANESTHESIA:   general  (concious sedation)  EBL:     BLOOD ADMINISTERED:none  DRAINS: none   LOCAL MEDICATIONS USED:  NONE  SPECIMEN:  No Specimen  DISPOSITION OF SPECIMEN:  N/A  COUNTS:  YES  TOURNIQUET:  * No tourniquets in log *  DICTATION: .Note written in paper chart  PLAN OF CARE: Discharge to home after PACU  PATIENT DISPOSITION:  PACU - hemodynamically stable.   Delay start of Pharmacological VTE agent (>24hrs) due to surgical blood loss or risk of bleeding: yes

## 2014-03-22 NOTE — Discharge Instructions (Signed)
1 - You may have urinary urgency (bladder spasms) and bloody urine on / off and pass stone fragments for few days. This is normal.  2 - Call MD or go to ER for fever >102, severe pain / nausea / vomiting not relieved by medications, or acute change in medical status

## 2014-04-13 ENCOUNTER — Ambulatory Visit (HOSPITAL_COMMUNITY): Payer: 59

## 2014-06-01 ENCOUNTER — Ambulatory Visit (INDEPENDENT_AMBULATORY_CARE_PROVIDER_SITE_OTHER): Payer: 59 | Admitting: Family Medicine

## 2014-06-01 VITALS — BP 128/70 | HR 92 | Temp 98.7°F | Resp 16 | Ht 60.0 in | Wt 163.0 lb

## 2014-06-01 DIAGNOSIS — R05 Cough: Secondary | ICD-10-CM | POA: Diagnosis not present

## 2014-06-01 DIAGNOSIS — J209 Acute bronchitis, unspecified: Secondary | ICD-10-CM | POA: Diagnosis not present

## 2014-06-01 DIAGNOSIS — R059 Cough, unspecified: Secondary | ICD-10-CM

## 2014-06-01 MED ORDER — HYDROCODONE-HOMATROPINE 5-1.5 MG/5ML PO SYRP: 5.0000 mL | ORAL_SOLUTION | Freq: Three times a day (TID) | ORAL | Status: DC | PRN

## 2014-06-01 MED ORDER — MONTELUKAST SODIUM 10 MG PO TABS: 10.0000 mg | ORAL_TABLET | Freq: Every day | ORAL | Status: DC

## 2014-06-01 NOTE — Progress Notes (Signed)
° °  Subjective:  This chart was scribed for Robyn Haber MD, by Tamsen Roers, at [facility].  This patient was seen in room 2 and the patient's care was started at 11:19 AM.    Patient ID: [name], female    DOB: 31-Oct-1970, 43 y.o.   MRN: [id number] Chief Complaint  Patient presents with   Cough    x 1 week    HPI  HPI Comments: [name] is a 44 y.o. female who presents to [facility] with a dry cough onset 1 week ago.  Patient has taken day quil/ Nyquil but denies any relief and is unable to sleep at night.  She denies a fever, wheezing, abdominal pain, nasal congestion,  history of asthma and is not a smoker. Patient works for [employer]. She has no other questions or concerns today.    Past Medical History  Diagnosis Date   Back pain    History of kidney stones    Chronic kidney disease     kidney stones    Current Outpatient Prescriptions on File Prior to Visit  Medication Sig Dispense Refill   levonorgestrel (MIRENA) 20 MCG/24HR IUD 1 each by Intrauterine route once.     oxyCODONE-acetaminophen (PERCOCET/ROXICET) 5-325 MG per tablet Take 1-2 tablets by mouth every 6 (six) hours as needed for moderate pain or severe pain. Post-operatively (Patient not taking: Reported on 06/01/2014) 40 tablet 0   No current facility-administered medications on file prior to visit.    Allergies  Allergen Reactions   Tramadol Hives, Itching and Rash        Review of Systems  Constitutional: Negative for fever and chills.  Respiratory: Positive for cough.   Gastrointestinal: Negative for nausea, vomiting and abdominal pain.       Objective:   Physical Exam  Constitutional: She is oriented to person, place, and time. She appears well-developed and well-nourished. No distress.  HENT:  Head: Normocephalic and atraumatic.  Eyes: Conjunctivae and EOM are normal.  Neck: Neck supple.  Cardiovascular: Normal rate.     Pulmonary/Chest: Effort normal. No respiratory distress.  Musculoskeletal: Normal range of motion.  Neurological: She is alert and oriented to person, place, and time.  Skin: Skin is warm and dry.  Psychiatric: She has a normal mood and affect. Her behavior is normal.  Nursing note and vitals reviewed.    Filed Vitals:   06/01/14 1058  BP: 128/70  Pulse: 92  Temp: 98.7 F (37.1 C)  TempSrc: Oral  Resp: 16  Height: 5' (1.524 m)  Weight: 163 lb (73.936 kg)  SpO2: 98%        Assessment & Plan:   This chart was scribed in my presence and reviewed by me personally.    ICD-9-CM ICD-10-CM   1. Cough 786.2 R05 montelukast (SINGULAIR) 10 MG tablet     HYDROcodone-homatropine (HYCODAN) 5-1.5 MG/5ML syrup  2. Acute bronchitis, unspecified organism 466.0 J20.9 montelukast (SINGULAIR) 10 MG tablet     HYDROcodone-homatropine (HYCODAN) 5-1.5 MG/5ML syrup     Signed, Robyn Haber, MD

## 2014-06-01 NOTE — Patient Instructions (Signed)

## 2015-01-14 ENCOUNTER — Ambulatory Visit (INDEPENDENT_AMBULATORY_CARE_PROVIDER_SITE_OTHER): Payer: 59 | Admitting: Family Medicine

## 2015-01-14 VITALS — BP 126/80 | HR 85 | Temp 98.6°F | Resp 16 | Ht 60.0 in | Wt 151.0 lb

## 2015-01-14 DIAGNOSIS — Z9049 Acquired absence of other specified parts of digestive tract: Secondary | ICD-10-CM | POA: Diagnosis not present

## 2015-01-14 DIAGNOSIS — R05 Cough: Secondary | ICD-10-CM | POA: Diagnosis not present

## 2015-01-14 DIAGNOSIS — R109 Unspecified abdominal pain: Secondary | ICD-10-CM | POA: Diagnosis not present

## 2015-01-14 DIAGNOSIS — R1011 Right upper quadrant pain: Secondary | ICD-10-CM | POA: Diagnosis not present

## 2015-01-14 DIAGNOSIS — N10 Acute tubulo-interstitial nephritis: Secondary | ICD-10-CM | POA: Diagnosis not present

## 2015-01-14 DIAGNOSIS — J069 Acute upper respiratory infection, unspecified: Secondary | ICD-10-CM

## 2015-01-14 DIAGNOSIS — R059 Cough, unspecified: Secondary | ICD-10-CM

## 2015-01-14 LAB — POCT CBC
Granulocyte percent: 71.1 %G (ref 37–80)
HCT, POC: 40.6 % (ref 37.7–47.9)
Hemoglobin: 13.7 g/dL (ref 12.2–16.2)
LYMPH, POC: 2 (ref 0.6–3.4)
MCH, POC: 28.3 pg (ref 27–31.2)
MCHC: 33.9 g/dL (ref 31.8–35.4)
MCV: 83.5 fL (ref 80–97)
MID (CBC): 0.7 (ref 0–0.9)
MPV: 7.7 fL (ref 0–99.8)
PLATELET COUNT, POC: 307 10*3/uL (ref 142–424)
POC Granulocyte: 6.7 (ref 2–6.9)
POC LYMPH %: 21.2 % (ref 10–50)
POC MID %: 7.7 %M (ref 0–12)
RBC: 4.86 M/uL (ref 4.04–5.48)
RDW, POC: 13.3 %
WBC: 9.4 10*3/uL (ref 4.6–10.2)

## 2015-01-14 LAB — POCT URINALYSIS DIP (MANUAL ENTRY)
BILIRUBIN UA: NEGATIVE
Bilirubin, UA: NEGATIVE
Glucose, UA: 100 — AB
Nitrite, UA: NEGATIVE
PH UA: 7.5
PROTEIN UA: NEGATIVE
SPEC GRAV UA: 1.015
Urobilinogen, UA: 0.2

## 2015-01-14 LAB — COMPLETE METABOLIC PANEL WITH GFR
ALBUMIN: 4.5 g/dL (ref 3.6–5.1)
ALK PHOS: 61 U/L (ref 33–115)
ALT: 27 U/L (ref 6–29)
AST: 16 U/L (ref 10–30)
BUN: 10 mg/dL (ref 7–25)
CALCIUM: 9.6 mg/dL (ref 8.6–10.2)
CO2: 29 mmol/L (ref 20–31)
CREATININE: 0.63 mg/dL (ref 0.50–1.10)
Chloride: 102 mmol/L (ref 98–110)
GFR, Est African American: >89 mL/min (ref >=60)
GFR, Est Non African American: >89 mL/min (ref >=60)
GLUCOSE: 123 mg/dL — AB (ref 65–99)
Potassium: 3.6 mmol/L (ref 3.5–5.3)
SODIUM: 140 mmol/L (ref 135–146)
TOTAL PROTEIN: 7.5 g/dL (ref 6.1–8.1)
Total Bilirubin: 0.3 mg/dL (ref 0.2–1.2)

## 2015-01-14 LAB — POC MICROSCOPIC URINALYSIS (UMFC): Mucus: ABSENT

## 2015-01-14 MED ORDER — CEPHALEXIN 500 MG PO CAPS: 500.0000 mg | ORAL_CAPSULE | Freq: Three times a day (TID) | ORAL | Status: DC

## 2015-01-14 MED ORDER — HYDROCODONE-HOMATROPINE 5-1.5 MG/5ML PO SYRP: 5.0000 mL | ORAL_SOLUTION | ORAL | Status: DC | PRN

## 2015-01-14 MED ORDER — DICLOFENAC SODIUM 75 MG PO TBEC: 75.0000 mg | DELAYED_RELEASE_TABLET | Freq: Two times a day (BID) | ORAL | Status: DC

## 2015-01-14 MED ORDER — IPRATROPIUM BROMIDE 0.03 % NA SOLN
2.0000 | Freq: Two times a day (BID) | NASAL | Status: DC
Start: 2015-01-14 — End: 2015-02-08

## 2015-01-14 MED ORDER — BENZONATATE 100 MG PO CAPS: 100.0000 mg | ORAL_CAPSULE | Freq: Three times a day (TID) | ORAL | Status: DC | PRN

## 2015-01-14 NOTE — Progress Notes (Signed)
Patient ID: [name], female    DOB: Aug 25, 1970  Age: 45 y.o. MRN: [id number]  Chief Complaint  Patient presents with  . Back Pain    x 10 days   . Cough    dry, x  2 days   . throat itch    x 2 days     Subjective:   45 year old here with a couple things. She's been having problems with pain in her right flank for the past 10 days. She doesn't know whether it's muscle strain". Hurts when she moves certain directions. No change in urination. She does have a history of having a kidney stone in the past, but that was taken care of. She also been having a respiratory tract infection the last couple days which is primarily when she came in. She's had a lot of head congestion, runny nose, coughing. No fever. Husband is been sick also as have many people at work. She did get a flu shot this year.  Current allergies, medications, problem list, past/family and social histories reviewed.  Objective:  BP 126/80 mmHg  Pulse 85  Temp(Src) 98.6 F (37 C) (Oral)  Resp 16  Ht 5' (1.524 m)  Wt 151 lb (68.493 kg)  BMI 29.49 kg/m2  SpO2 99%  No acute distress. TMs normal. Throat clear. Sinuses nontender. Neck supple without nodes. Nose is congested. Chest clear. Heart regular without murmur. Mild right CVA tenderness. Mild right upper quadrant abdominal tenderness. Has scars from the cholecystectomy.  Assessment & Plan:   Assessment: 1. Acute upper respiratory infection   2. Right flank pain   3. History of cholecystectomy   4. RUQ pain   5. Cough   6. Acute pyelonephritis     Results for orders placed or performed in visit on 01/14/15  POCT CBC  Result Value Ref Range   WBC 9.4 4.6 - 10.2 K/uL   Lymph, poc 2.0 0.6 - 3.4   POC LYMPH PERCENT 21.2 10 - 50 %L   MID (cbc) 0.7 0 - 0.9   POC MID % 7.7 0 - 12 %M   POC Granulocyte 6.7 2 - 6.9   Granulocyte percent 71.1 37 - 80 %G   RBC 4.86 4.04 - 5.48 M/uL   Hemoglobin 13.7 12.2 - 16.2 g/dL   HCT, POC 40.6 37.7 - 47.9 %   MCV 83.5 80  - 97 fL   MCH, POC 28.3 27 - 31.2 pg   MCHC 33.9 31.8 - 35.4 g/dL   RDW, POC 13.3 %   Platelet Count, POC 307 142 - 424 K/uL   MPV 7.7 0 - 99.8 fL  POCT urinalysis dipstick  Result Value Ref Range   Color, UA yellow yellow   Clarity, UA clear clear   Glucose, UA =100 (A) negative   Bilirubin, UA negative negative   Ketones, POC UA negative negative   Spec Grav, UA 1.015    Blood, UA trace-intact (A) negative   pH, UA 7.5    Protein Ur, POC negative negative   Urobilinogen, UA 0.2    Nitrite, UA Negative Negative   Leukocytes, UA small (1+) (A) Negative  POCT Microscopic Urinalysis ([facility])  Result Value Ref Range   WBC,UR,HPF,POC Too numerous to count  (A) None WBC/hpf   RBC,UR,HPF,POC Few (A) None RBC/hpf   Bacteria Few (A) None, Too numerous to count   Mucus Absent Absent   Epithelial Cells, UR Per Microscopy Many (A) None, Too numerous to count cells/hpf  Plan Check labs   Orders Placed This Encounter  Procedures  . Urine culture  . COMPLETE METABOLIC PANEL WITH GFR  . POCT CBC  . POCT urinalysis dipstick  . POCT Microscopic Urinalysis ([facility])    Meds ordered this encounter  Medications  . ipratropium (ATROVENT) 0.03 % nasal spray    Sig: Place 2 sprays into both nostrils 2 (two) times daily.    Dispense:  30 mL    Refill:  0  . benzonatate (TESSALON) 100 MG capsule    Sig: Take 1-2 capsules (100-200 mg total) by mouth 3 (three) times daily as needed.    Dispense:  30 capsule    Refill:  0  . HYDROcodone-homatropine (HYCODAN) 5-1.5 MG/5ML syrup    Sig: Take 5 mLs by mouth every 4 (four) hours as needed.    Dispense:  120 mL    Refill:  0  . cephALEXin (KEFLEX) 500 MG capsule    Sig: Take 1 capsule (500 mg total) by mouth 3 (three) times daily.    Dispense:  15 capsule    Refill:  0  . diclofenac (VOLTAREN) 75 MG EC tablet    Sig: Take 1 tablet (75 mg total) by mouth 2 (two) times daily.    Dispense:  20 tablet    Refill:  0  . metFORMIN (GLUCOPHAGE)  500 MG tablet    Sig: Take 500 mg by mouth 2 (two) times daily with a meal.         Patient Instructions  Drink plenty of fluids and get enough rest  Try to abide by your diabetic diet well  Use the Atrovent nose spray 2 sprays in each nostril every 6 hours as needed for head congestion  Take the Hycodan cough syrup 1 teaspoon every 4-6 hours as needed for nighttime cough  If you need something for daytime cough also I have prescribed benzonatate cough pills which he can take 1 or 2 every 6 hours as needed when awake. These will not make you sleepy like the cough syrup well.  Take the cephalexin antibiotic one pill 3 times daily for 5 days for urinary infection.  Take the diclofenac 75 mg one twice daily with breakfast and supper for the flank pain  Return if worse     Return if symptoms worsen or fail to improve.   Keren Alverio, MD 01/14/2015

## 2015-01-14 NOTE — Patient Instructions (Signed)
Drink plenty of fluids and get enough rest  Try to abide by your diabetic diet well  Use the Atrovent nose spray 2 sprays in each nostril every 6 hours as needed for head congestion  Take the Hycodan cough syrup 1 teaspoon every 4-6 hours as needed for nighttime cough  If you need something for daytime cough also I have prescribed benzonatate cough pills which he can take 1 or 2 every 6 hours as needed when awake. These will not make you sleepy like the cough syrup well.  Take the cephalexin antibiotic one pill 3 times daily for 5 days for urinary infection.  Take the diclofenac 75 mg one twice daily with breakfast and supper for the flank pain  Return if worse

## 2015-01-17 LAB — URINE CULTURE: Colony Count: 40000

## 2015-02-08 ENCOUNTER — Ambulatory Visit (INDEPENDENT_AMBULATORY_CARE_PROVIDER_SITE_OTHER): Payer: 59 | Admitting: Physician Assistant

## 2015-02-08 VITALS — BP 130/80 | HR 84 | Temp 97.8°F | Resp 20 | Ht 60.0 in | Wt 152.4 lb

## 2015-02-08 DIAGNOSIS — J069 Acute upper respiratory infection, unspecified: Secondary | ICD-10-CM

## 2015-02-08 DIAGNOSIS — R05 Cough: Secondary | ICD-10-CM | POA: Diagnosis not present

## 2015-02-08 DIAGNOSIS — R059 Cough, unspecified: Secondary | ICD-10-CM

## 2015-02-08 MED ORDER — GUAIFENESIN ER 1200 MG PO TB12: 1.0000 | ORAL_TABLET | Freq: Two times a day (BID) | ORAL | Status: DC | PRN

## 2015-02-08 MED ORDER — HYDROCODONE-HOMATROPINE 5-1.5 MG/5ML PO SYRP: 5.0000 mL | ORAL_SOLUTION | ORAL | Status: DC | PRN

## 2015-02-08 MED ORDER — BENZONATATE 100 MG PO CAPS: 100.0000 mg | ORAL_CAPSULE | Freq: Three times a day (TID) | ORAL | Status: DC | PRN

## 2015-02-08 NOTE — Patient Instructions (Addendum)
This appears to be a virus.  Please hydrate well with 64 oz of water which is almost 4 regular sized water bottles. Get plenty of rest.  i would like you to contact me if your symptoms continue after 5 days.     Upper Respiratory Infection, Adult Most upper respiratory infections (URIs) are a viral infection of the air passages leading to the lungs. A URI affects the nose, throat, and upper air passages. The most common type of URI is nasopharyngitis and is typically referred to as "the common cold." URIs run their course and usually go away on their own. Most of the time, a URI does not require medical attention, but sometimes a bacterial infection in the upper airways can follow a viral infection. This is called a secondary infection. Sinus and middle ear infections are common types of secondary upper respiratory infections. Bacterial pneumonia can also complicate a URI. A URI can worsen asthma and chronic obstructive pulmonary disease (COPD). Sometimes, these complications can require emergency medical care and may be life threatening.  CAUSES Almost all URIs are caused by viruses. A virus is a type of germ and can spread from one person to another.  RISKS FACTORS You may be at risk for a URI if:   You smoke.   You have chronic heart or lung disease.  You have a weakened defense (immune) system.   You are very young or very old.   You have nasal allergies or asthma.  You work in crowded or poorly ventilated areas.  You work in health care facilities or schools. SIGNS AND SYMPTOMS  Symptoms typically develop 2-3 days after you come in contact with a cold virus. Most viral URIs last 7-10 days. However, viral URIs from the influenza virus (flu virus) can last 14-18 days and are typically more severe. Symptoms may include:   Runny or stuffy (congested) nose.   Sneezing.   Cough.   Sore throat.   Headache.   Fatigue.   Fever.   Loss of appetite.   Pain in your  forehead, behind your eyes, and over your cheekbones (sinus pain).  Muscle aches.  DIAGNOSIS  Your health care provider may diagnose a URI by:  Physical exam.  Tests to check that your symptoms are not due to another condition such as:  Strep throat.  Sinusitis.  Pneumonia.  Asthma. TREATMENT  A URI goes away on its own with time. It cannot be cured with medicines, but medicines may be prescribed or recommended to relieve symptoms. Medicines may help:  Reduce your fever.  Reduce your cough.  Relieve nasal congestion. HOME CARE INSTRUCTIONS   Take medicines only as directed by your health care provider.   Gargle warm saltwater or take cough drops to comfort your throat as directed by your health care provider.  Use a warm mist humidifier or inhale steam from a shower to increase air moisture. This may make it easier to breathe.  Drink enough fluid to keep your urine clear or pale yellow.   Eat soups and other clear broths and maintain good nutrition.   Rest as needed.   Return to work when your temperature has returned to normal or as your health care provider advises. You may need to stay home longer to avoid infecting others. You can also use a face mask and careful hand washing to prevent spread of the virus.  Increase the usage of your inhaler if you have asthma.   Do not use any tobacco  products, including cigarettes, chewing tobacco, or electronic cigarettes. If you need help quitting, ask your health care provider. PREVENTION  The best way to protect yourself from getting a cold is to practice good hygiene.   Avoid oral or hand contact with people with cold symptoms.   Wash your hands often if contact occurs.  There is no clear evidence that vitamin C, vitamin E, echinacea, or exercise reduces the chance of developing a cold. However, it is always recommended to get plenty of rest, exercise, and practice good nutrition.  SEEK MEDICAL CARE IF:   You  are getting worse rather than better.   Your symptoms are not controlled by medicine.   You have chills.  You have worsening shortness of breath.  You have brown or red mucus.  You have yellow or brown nasal discharge.  You have pain in your face, especially when you bend forward.  You have a fever.  You have swollen neck glands.  You have pain while swallowing.  You have white areas in the back of your throat. SEEK IMMEDIATE MEDICAL CARE IF:   You have severe or persistent:  Headache.  Ear pain.  Sinus pain.  Chest pain.  You have chronic lung disease and any of the following:  Wheezing.  Prolonged cough.  Coughing up blood.  A change in your usual mucus.  You have a stiff neck.  You have changes in your:  Vision.  Hearing.  Thinking.  Mood. MAKE SURE YOU:   Understand these instructions.  Will watch your condition.  Will get help right away if you are not doing well or get worse.   This information is not intended to replace advice given to you by your health care provider. Make sure you discuss any questions you have with your health care provider.   Document Released: 06/17/2000 Document Revised: 05/08/2014 Document Reviewed: 03/29/2013 Elsevier Interactive Patient Education [employer].

## 2015-02-08 NOTE — Progress Notes (Signed)
[facility] [address] [place] [address] [phone]  Date:  02/08/2015   Name:  [name]   DOB:  [birth year]   MRN:  [id number]  PCP:  Default, Provider, MD    History of Present Illness:  [name] is a 45 y.o. female patient who presents to [facility] for chief complaint of cough for 5 days. Cough is worse at night, waking the family for 5 days.  She has a sorethroat.  Mild nasal congestion.  No fever.  No sob or dyspnea.  No ear fullness.  She states she has been taking dayquil and nyquil which appear to not be working at this time.  Patient Active Problem List   Diagnosis Date Noted  . Pain in thoracic spine 06/22/2012  . Lumbago 06/22/2012    Past Medical History  Diagnosis Date  . Back pain   . History of kidney stones   . Chronic kidney disease     kidney stones  . Diabetes mellitus without complication [facility])     Past Surgical History  Procedure Laterality Date  . Cholecystectomy, laparoscopic    . Cystoscopy with ureteroscopy and stent placement Bilateral 07/29/2012    Procedure: CYSTOSCOPY WITH BILATERAL URETEROSCOPY AND BILATERAL STENT PLACEMENT, BASKET EXTRACTION OF BILATERAL URETERAL STONES;  Surgeon: Alexis Frock, MD;  Location: [facility];  Service: Urology;  Laterality: Bilateral;    Social History  Substance Use Topics  . Smoking status: Never Smoker   . Smokeless tobacco: Never Used  . Alcohol Use: No     Comment: occ    Family History  Problem Relation Age of Onset  . Other Neg Hx     Allergies  Allergen Reactions  . Tramadol Hives, Itching and Rash    Medication list has been reviewed and updated.  Current Outpatient Prescriptions on File Prior to Visit  Medication Sig Dispense Refill  . levonorgestrel (MIRENA) 20 MCG/24HR IUD 1 each by Intrauterine route once.    . metFORMIN (GLUCOPHAGE) 500 MG tablet Take 500 mg by mouth 2 (two) times daily with a meal.     No current facility-administered medications on  file prior to visit.    ROS ROS otherwise unremarkable unless listed above.  Physical Examination: BP 130/80 mmHg  Pulse 84  Temp(Src) 97.8 F (36.6 C) (Oral)  Resp 20  Ht 5' (1.524 m)  Wt 152 lb 6.4 oz (69.128 kg)  BMI 29.76 kg/m2  SpO2 96% Ideal Body Weight: Weight in (lb) to have BMI = 25: 127.7  Physical Exam  Constitutional: She is oriented to person, place, and time. She appears well-developed and well-nourished. No distress.  HENT:  Head: Normocephalic and atraumatic.  Right Ear: Tympanic membrane, external ear and ear canal normal.  Left Ear: Tympanic membrane, external ear and ear canal normal.  Mouth/Throat: Posterior oropharyngeal erythema (minimal) present. No oropharyngeal exudate or posterior oropharyngeal edema.  Eyes: Conjunctivae and EOM are normal. Pupils are equal, round, and reactive to light.  Cardiovascular: Normal rate and regular rhythm.  Exam reveals no friction rub.   No murmur heard. Pulmonary/Chest: Effort normal. No respiratory distress. She has no wheezes.  Neurological: She is alert and oriented to person, place, and time.  Skin: Skin is warm and dry. She is not diaphoretic.  Psychiatric: She has a normal mood and affect. Her behavior is normal.     Assessment and Plan: [name] is a 45 y.o. female who is here today for  congestion and cough. -appears viral and will treat supportively at this time. -advised to contact if sxs continue   Acute upper respiratory infection - Plan: Guaifenesin (MUCINEX MAXIMUM STRENGTH) 1200 MG TB12, benzonatate (TESSALON) 100 MG capsule  Cough - Plan: HYDROcodone-homatropine (HYCODAN) 5-1.5 MG/5ML syrup, benzonatate (TESSALON) 100 MG capsule  Ivar Drape, PA-C [facility] 2/5/20179:46 PM

## 2015-02-22 ENCOUNTER — Ambulatory Visit: Payer: 59 | Admitting: Family Medicine

## 2015-03-22 ENCOUNTER — Ambulatory Visit (INDEPENDENT_AMBULATORY_CARE_PROVIDER_SITE_OTHER): Payer: 59 | Admitting: Internal Medicine

## 2015-03-22 VITALS — BP 118/68 | HR 87 | Temp 98.1°F | Resp 17 | Ht 60.0 in | Wt 154.0 lb

## 2015-03-22 DIAGNOSIS — J029 Acute pharyngitis, unspecified: Secondary | ICD-10-CM | POA: Diagnosis not present

## 2015-03-22 DIAGNOSIS — R05 Cough: Secondary | ICD-10-CM | POA: Diagnosis not present

## 2015-03-22 DIAGNOSIS — R059 Cough, unspecified: Secondary | ICD-10-CM

## 2015-03-22 LAB — POCT RAPID STREP A (OFFICE): RAPID STREP A SCREEN: NEGATIVE

## 2015-03-22 MED ORDER — HYDROCODONE-HOMATROPINE 5-1.5 MG/5ML PO SYRP: 5.0000 mL | ORAL_SOLUTION | Freq: Four times a day (QID) | ORAL | Status: DC | PRN

## 2015-03-22 NOTE — Progress Notes (Signed)
Subjective:  By signing my name below, I, Raven Small, attest that this documentation has been prepared under the direction and in the presence of Tami Lin, MD.  Electronically Signed: Thea Alken, ED Scribe. 03/22/2015. 4:37 PM.   Patient ID: [name], female    DOB: 1970/08/15, 45 y.o.   MRN: [id number]  HPI   Chief Complaint  Patient presents with  . Cough  . Generalized Body Aches  . Abdominal Pain   HPI Comments: [name] is a 45 y.o. female who presents to the [facility] complaining of a dry cough that began 4 days ago. She reports associated chills, body aches, sore throat due to cough, some nasal congestion and fever yesterday. Pt states cough keeps her awake at night. She denies diarrhea, decreased appetite. Pt got a flu shot this year.   Patient Active Problem List   Diagnosis Date Noted  . Pain in thoracic spine 06/22/2012  . Lumbago 06/22/2012   Past Surgical History  Procedure Laterality Date  . Cholecystectomy, laparoscopic    . Cystoscopy with ureteroscopy and stent placement Bilateral 07/29/2012    Procedure: CYSTOSCOPY WITH BILATERAL URETEROSCOPY AND BILATERAL STENT PLACEMENT, BASKET EXTRACTION OF BILATERAL URETERAL STONES;  Surgeon: Alexis Frock, MD;  Location: [facility];  Service: Urology;  Laterality: Bilateral;   Past Medical History  Diagnosis Date  . Back pain   . History of kidney stones   . Chronic kidney disease     kidney stones  . Diabetes mellitus without complication ([place] [facility])    Prior to Admission medications   Medication Sig Start Date End Date Taking? Authorizing Provider  levonorgestrel (MIRENA) 20 MCG/24HR IUD 1 each by Intrauterine route once.   Yes Historical Provider, MD  metFORMIN (GLUCOPHAGE) 500 MG tablet Take 500 mg by mouth 2 (two) times daily with a meal. Reported on 03/22/2015    Historical Provider, MD   Review of Systems  Constitutional: Positive for fever and chills. Negative for appetite  change.  HENT: Positive for congestion and sore throat.   Respiratory: Positive for cough and wheezing.   Gastrointestinal: Negative for nausea, vomiting and diarrhea.  Musculoskeletal: Positive for myalgias.       Objective:   Physical Exam  Constitutional: She is oriented to person, place, and time. She appears well-developed and well-nourished. No distress.  HENT:  Head: Normocephalic and atraumatic.  Right Ear: External ear normal.  Left Ear: External ear normal.  Nose: Nose normal.  Mouth/Throat: Oropharyngeal exudate and posterior oropharyngeal erythema present.  Eyes: Conjunctivae and EOM are normal.  Neck: Neck supple.  Cardiovascular: Normal rate, regular rhythm and normal heart sounds.   Pulmonary/Chest: Effort normal and breath sounds normal.  Musculoskeletal: Normal range of motion.  Lymphadenopathy:    She has cervical adenopathy.  1+ tender AC nodes  Neurological: She is alert and oriented to person, place, and time.  Skin: Skin is warm and dry.  Psychiatric: She has a normal mood and affect. Her behavior is normal.  Nursing note and vitals reviewed.   Filed Vitals:   03/22/15 1526  BP: 118/68  Pulse: 87  Temp: 98.1 F (36.7 C)  TempSrc: Oral  Resp: 17  Height: 5' (1.524 m)  Weight: 154 lb (69.854 kg)  SpO2: 98%   Results for orders placed or performed in visit on 03/22/15  POCT rapid strep A  Result Value Ref Range   Rapid Strep A Screen Negative Negative   Assessment & Plan:  1. Cough   2. Sore throat     Orders Placed This Encounter  Procedures  . POCT rapid strep A   Meds ordered this encounter  Medications  . HYDROcodone-homatropine (HYCODAN) 5-1.5 MG/5ML syrup    Sig: Take 5 mLs by mouth every 6 (six) hours as needed.    Dispense:  120 mL    Refill:  0    I have completed the patient encounter in its entirety as documented by the scribe, with editing by me where necessary. Anevay Campanella P. Laney Pastor, M.D.

## 2015-07-26 ENCOUNTER — Ambulatory Visit (INDEPENDENT_AMBULATORY_CARE_PROVIDER_SITE_OTHER): Payer: 59 | Admitting: Physician Assistant

## 2015-07-26 VITALS — BP 118/74 | HR 91 | Temp 98.8°F | Resp 16 | Ht 60.0 in | Wt 150.0 lb

## 2015-07-26 DIAGNOSIS — S4992XA Unspecified injury of left shoulder and upper arm, initial encounter: Secondary | ICD-10-CM | POA: Diagnosis not present

## 2015-07-26 DIAGNOSIS — M25512 Pain in left shoulder: Secondary | ICD-10-CM

## 2015-07-26 DIAGNOSIS — T148XXA Other injury of unspecified body region, initial encounter: Secondary | ICD-10-CM

## 2015-07-26 MED ORDER — MELOXICAM 7.5 MG PO TABS: 7.5000 mg | ORAL_TABLET | Freq: Every day | ORAL | Status: DC

## 2015-07-26 MED ORDER — CYCLOBENZAPRINE HCL 5 MG PO TABS: 5.0000 mg | ORAL_TABLET | Freq: Three times a day (TID) | ORAL | Status: DC | PRN

## 2015-07-26 NOTE — Progress Notes (Signed)
[facility] [address], [place] [address] [phone]  Date:  07/26/2015   Name:  [name]   DOB:  Sep 10, 1970   MRN:  [id number]  PCP:  Default, Provider, MD   Chief Complaint  Patient presents with  . Muscle Pain    left arm/shoulder, x 3 weeks    History of Present Illness:  [name] is a 45 y.o. female patient who presents to [facility] for left arm and shoulder pain --help co-workers lift 50lb.  That evening developed pain at the left shoulder.  Deltoid pain and into upper back.  No numbness or tingling.  No weakness.  Relieved with cradling her arm to her chest, and avoiding use.   No injury prior.    Wt Readings from Last 3 Encounters:  07/26/15 150 lb (68.04 kg)  03/22/15 154 lb (69.854 kg)  02/08/15 152 lb 6.4 oz (69.128 kg)        Patient Active Problem List   Diagnosis Date Noted  . Pain in thoracic spine 06/22/2012  . Lumbago 06/22/2012    Past Medical History  Diagnosis Date  . Back pain   . History of kidney stones   . Chronic kidney disease     kidney stones  . Diabetes mellitus without complication [facility])     Past Surgical History  Procedure Laterality Date  . Cholecystectomy, laparoscopic    . Cystoscopy with ureteroscopy and stent placement Bilateral 07/29/2012    Procedure: CYSTOSCOPY WITH BILATERAL URETEROSCOPY AND BILATERAL STENT PLACEMENT, BASKET EXTRACTION OF BILATERAL URETERAL STONES;  Surgeon: Alexis Frock, MD;  Location: [facility];  Service: Urology;  Laterality: Bilateral;    Social History  Substance Use Topics  . Smoking status: Never Smoker   . Smokeless tobacco: Never Used  . Alcohol Use: No     Comment: occ    Family History  Problem Relation Age of Onset  . Other Neg Hx     Allergies  Allergen Reactions  . Tramadol Hives, Itching and Rash    Medication list has been reviewed and updated.  Current Outpatient Prescriptions on File Prior to Visit  Medication Sig Dispense Refill  .  levonorgestrel (MIRENA) 20 MCG/24HR IUD 1 each by Intrauterine route once.    . metFORMIN (GLUCOPHAGE) 500 MG tablet Take 500 mg by mouth 2 (two) times daily with a meal. Reported on 03/22/2015    . HYDROcodone-homatropine (HYCODAN) 5-1.5 MG/5ML syrup Take 5 mLs by mouth every 6 (six) hours as needed. (Patient not taking: Reported on 07/26/2015) 120 mL 0   No current facility-administered medications on file prior to visit.    ROS   Physical Examination: BP 118/74 mmHg  Pulse 91  Temp(Src) 98.8 F (37.1 C)  Resp 16  Ht 5' (1.524 m)  Wt 150 lb (68.04 kg)  BMI 29.30 kg/m2  SpO2 97% Ideal Body Weight: Weight in (lb) to have BMI = 25: 127.7  Physical Exam  Constitutional: She is oriented to person, place, and time. She appears well-developed and well-nourished. No distress.  HENT:  Head: Normocephalic and atraumatic.  Right Ear: External ear normal.  Left Ear: External ear normal.  Eyes: Conjunctivae and EOM are normal. Pupils are equal, round, and reactive to light.  Cardiovascular: Normal rate.   Pulmonary/Chest: Effort normal. No respiratory distress.  Musculoskeletal:  No erythema, swelling, or warmth.  No spinous tenderness.  Pain with neck horizontal rotation toward the left side with pain at the left  upper back.  This is also prominent with forward flexion of the neck.  Pain with external rotation of shoulder though normal rom throughout.  +Hawkins, -Neer  Neurological: She is alert and oriented to person, place, and time.  Skin: She is not diaphoretic.  Psychiatric: She has a normal mood and affect. Her behavior is normal.     Assessment and Plan: [name] is a 45 y.o. female who is here today for shoulder pain. This appears to be muscle strain of upper thoracic/shoulder.  Possible trapezius strain, rotator cuff tear, gh tear.   Declines xr today.   I have advised muscle relaxant and discussed precaution, as well as that of nsaid.  Icing and stretches 3 times per  day. rtc in 7 days if no improvement.   Muscle strain - Plan: meloxicam (MOBIC) 7.5 MG tablet, cyclobenzaprine (FLEXERIL) 5 MG tablet  Pain in joint of left shoulder - Plan: meloxicam (MOBIC) 7.5 MG tablet, cyclobenzaprine (FLEXERIL) 5 MG tablet   Ivar Drape, PA-C [facility] 07/26/2015 2:03 PM

## 2015-07-26 NOTE — Patient Instructions (Addendum)
IF you received an x-ray today, you will receive an invoice from [facility]. Please contact [facility] at [phone] with questions or concerns regarding your invoice.   IF you received labwork today, you will receive an invoice from [employer]. Please contact [facility] at [phone] with questions or concerns regarding your invoice.   Our billing staff will not be able to assist you with questions regarding bills from these companies.  You will be contacted with the lab results as soon as they are available. The fastest way to get your results is to activate your My Chart account. Instructions are located on the last page of this paperwork. If you have not heard from [place] regarding the results in 2 weeks, please contact this office.    This appears to be a spasm or strain. Please take the mobic daily.  Do not take ibuprofen or naproxen.  You can take tylenol. Please ice three times per day for 15 minutes.  These can occur after your stretches and exercises below.  Pick three pictures.  Do three times per day.  If it says repeat movement, do 5 times.  If it says hold position, hold for 10 seconds.   Impingement Syndrome, Rotator Cuff, Bursitis With Rehab Impingement syndrome is a condition that involves inflammation of the tendons of the rotator cuff and the subacromial bursa, that causes pain in the shoulder. The rotator cuff consists of four tendons and muscles that control much of the shoulder and upper arm function. The subacromial bursa is a fluid filled sac that helps reduce friction between the rotator cuff and one of the bones of the shoulder (acromion). Impingement syndrome is usually an overuse injury that causes swelling of the bursa (bursitis), swelling of the tendon (tendonitis), and/or a tear of the tendon (strain). Strains are classified into three categories. Grade 1 strains cause pain, but the tendon is not lengthened. Grade 2  strains include a lengthened ligament, due to the ligament being stretched or partially ruptured. With grade 2 strains there is still function, although the function may be decreased. Grade 3 strains include a complete tear of the tendon or muscle, and function is usually impaired. SYMPTOMS   Pain around the shoulder, often at the outer portion of the upper arm.  Pain that gets worse with shoulder function, especially when reaching overhead or lifting.  Sometimes, aching when not using the arm.  Pain that wakes you up at night.  Sometimes, tenderness, swelling, warmth, or redness over the affected area.  Loss of strength.  Limited motion of the shoulder, especially reaching behind the back (to the back pocket or to unhook bra) or across your body.  Crackling sound (crepitation) when moving the arm.  Biceps tendon pain and inflammation (in the front of the shoulder). Worse when bending the elbow or lifting. CAUSES  Impingement syndrome is often an overuse injury, in which chronic (repetitive) motions cause the tendons or bursa to become inflamed. A strain occurs when a force is paced on the tendon or muscle that is greater than it can withstand. Common mechanisms of injury include: Stress from sudden increase in duration, frequency, or intensity of training.  Direct hit (trauma) to the shoulder.  Aging, erosion of the tendon with normal use.  Bony bump on shoulder (acromial spur). RISK INCREASES WITH:  Contact sports (football, wrestling, boxing).  Throwing sports (baseball, tennis, volleyball).  Weightlifting and bodybuilding.  Heavy labor.  Previous injury to the rotator cuff,  including impingement.  Poor shoulder strength and flexibility.  Failure to warm up properly before activity.  Inadequate protective equipment.  Old age.  Bony bump on shoulder (acromial spur). PREVENTION   Warm up and stretch properly before activity.  Allow for adequate recovery between  workouts.  Maintain physical fitness:  Strength, flexibility, and endurance.  Cardiovascular fitness.  Learn and use proper exercise technique. PROGNOSIS  If treated properly, impingement syndrome usually goes away within 6 weeks. Sometimes surgery is required.  RELATED COMPLICATIONS   Longer healing time if not properly treated, or if not given enough time to heal.  Recurring symptoms, that result in a chronic condition.  Shoulder stiffness, frozen shoulder, or loss of motion.  Rotator cuff tendon tear.  Recurring symptoms, especially if activity is resumed too soon, with overuse, with a direct blow, or when using poor technique. TREATMENT  Treatment first involves the use of ice and medicine, to reduce pain and inflammation. The use of strengthening and stretching exercises may help reduce pain with activity. These exercises may be performed at home or with a therapist. If non-surgical treatment is unsuccessful after more than 6 months, surgery may be advised. After surgery and rehabilitation, activity is usually possible in 3 months.  MEDICATION  If pain medicine is needed, nonsteroidal anti-inflammatory medicines (aspirin and ibuprofen), or other minor pain relievers (acetaminophen), are often advised.  Do not take pain medicine for 7 days before surgery.  Prescription pain relievers may be given, if your caregiver thinks they are needed. Use only as directed and only as much as you need.  Corticosteroid injections may be given by your caregiver. These injections should be reserved for the most serious cases, because they may only be given a certain number of times. HEAT AND COLD  Cold treatment (icing) should be applied for 10 to 15 minutes every 2 to 3 hours for inflammation and pain, and immediately after activity that aggravates your symptoms. Use ice packs or an ice massage.  Heat treatment may be used before performing stretching and strengthening activities prescribed  by your caregiver, physical therapist, or athletic trainer. Use a heat pack or a warm water soak. SEEK MEDICAL CARE IF:   Symptoms get worse or do not improve in 4 to 6 weeks, despite treatment.  New, unexplained symptoms develop. (Drugs used in treatment may produce side effects.) EXERCISES  RANGE OF MOTION (ROM) AND STRETCHING EXERCISES - Impingement Syndrome (Rotator Cuff  Tendinitis, Bursitis) These exercises may help you when beginning to rehabilitate your injury. Your symptoms may go away with or without further involvement from your physician, physical therapist or athletic trainer. While completing these exercises, remember:   Restoring tissue flexibility helps normal motion to return to the joints. This allows healthier, less painful movement and activity.  An effective stretch should be held for at least 30 seconds.  A stretch should never be painful. You should only feel a gentle lengthening or release in the stretched tissue. STRETCH - Flexion, Standing  Stand with good posture. With an underhand grip on your right / left hand, and an overhand grip on the opposite hand, grasp a broomstick or cane so that your hands are a little more than shoulder width apart.  Keeping your right / left elbow straight and shoulder muscles relaxed, push the stick with your opposite hand, to raise your right / left arm in front of your body and then overhead. Raise your arm until you feel a stretch in your right / left  shoulder, but before you have increased shoulder pain.  Try to avoid shrugging your right / left shoulder as your arm rises, by keeping your shoulder blade tucked down and toward your mid-back spine. Hold for __________ seconds.  Slowly return to the starting position. Repeat __________ times. Complete this exercise __________ times per day. STRETCH - Abduction, Supine  Lie on your back. With an underhand grip on your right / left hand and an overhand grip on the opposite hand,  grasp a broomstick or cane so that your hands are a little more than shoulder width apart.  Keeping your right / left elbow straight and your shoulder muscles relaxed, push the stick with your opposite hand, to raise your right / left arm out to the side of your body and then overhead. Raise your arm until you feel a stretch in your right / left shoulder, but before you have increased shoulder pain.  Try to avoid shrugging your right / left shoulder as your arm rises, by keeping your shoulder blade tucked down and toward your mid-back spine. Hold for __________ seconds.  Slowly return to the starting position. Repeat __________ times. Complete this exercise __________ times per day. ROM - Flexion, Active-Assisted  Lie on your back. You may bend your knees for comfort.  Grasp a broomstick or cane so your hands are about shoulder width apart. Your right / left hand should grip the end of the stick, so that your hand is positioned "thumbs-up," as if you were about to shake hands.  Using your healthy arm to lead, raise your right / left arm overhead, until you feel a gentle stretch in your shoulder. Hold for __________ seconds.  Use the stick to assist in returning your right / left arm to its starting position. Repeat __________ times. Complete this exercise __________ times per day.  ROM - Internal Rotation, Supine   Lie on your back on a firm surface. Place your right / left elbow about 60 degrees away from your side. Elevate your elbow with a folded towel, so that the elbow and shoulder are the same height.  Using a broomstick or cane and your strong arm, pull your right / left hand toward your body until you feel a gentle stretch, but no increase in your shoulder pain. Keep your shoulder and elbow in place throughout the exercise.  Hold for __________ seconds. Slowly return to the starting position. Repeat __________ times. Complete this exercise __________ times per day. STRETCH -  Internal Rotation  Place your right / left hand behind your back, palm up.  Throw a towel or belt over your opposite shoulder. Grasp the towel with your right / left hand.  While keeping an upright posture, gently pull up on the towel, until you feel a stretch in the front of your right / left shoulder.  Avoid shrugging your right / left shoulder as your arm rises, by keeping your shoulder blade tucked down and toward your mid-back spine.  Hold for __________ seconds. Release the stretch, by lowering your healthy hand. Repeat __________ times. Complete this exercise __________ times per day. ROM - Internal Rotation   Using an underhand grip, grasp a stick behind your back with both hands.  While standing upright with good posture, slide the stick up your back until you feel a mild stretch in the front of your shoulder.  Hold for __________ seconds. Slowly return to your starting position. Repeat __________ times. Complete this exercise __________ times per day.  STRETCH - Posterior Shoulder Capsule   Stand or sit with good posture. Grasp your right / left elbow and draw it across your chest, keeping it at the same height as your shoulder.  Pull your elbow, so your upper arm comes in closer to your chest. Pull until you feel a gentle stretch in the back of your shoulder.  Hold for __________ seconds. Repeat __________ times. Complete this exercise __________ times per day. STRENGTHENING EXERCISES - Impingement Syndrome (Rotator Cuff Tendinitis, Bursitis) These exercises may help you when beginning to rehabilitate your injury. They may resolve your symptoms with or without further involvement from your physician, physical therapist or athletic trainer. While completing these exercises, remember:  Muscles can gain both the endurance and the strength needed for everyday activities through controlled exercises.  Complete these exercises as instructed by your physician, physical therapist  or athletic trainer. Increase the resistance and repetitions only as guided.  You may experience muscle soreness or fatigue, but the pain or discomfort you are trying to eliminate should never worsen during these exercises. If this pain does get worse, stop and make sure you are following the directions exactly. If the pain is still present after adjustments, discontinue the exercise until you can discuss the trouble with your clinician.  During your recovery, avoid activity or exercises which involve actions that place your injured hand or elbow above your head or behind your back or head. These positions stress the tissues which you are trying to heal. STRENGTH - Scapular Depression and Adduction   With good posture, sit on a firm chair. Support your arms in front of you, with pillows, arm rests, or on a table top. Have your elbows in line with the sides of your body.  Gently draw your shoulder blades down and toward your mid-back spine. Gradually increase the tension, without tensing the muscles along the top of your shoulders and the back of your neck.  Hold for __________ seconds. Slowly release the tension and relax your muscles completely before starting the next repetition.  After you have practiced this exercise, remove the arm support and complete the exercise in standing as well as sitting position. Repeat __________ times. Complete this exercise __________ times per day.  STRENGTH - Shoulder Abductors, Isometric  With good posture, stand or sit about 4-6 inches from a wall, with your right / left side facing the wall.  Bend your right / left elbow. Gently press your right / left elbow into the wall. Increase the pressure gradually, until you are pressing as hard as you can, without shrugging your shoulder or increasing any shoulder discomfort.  Hold for __________ seconds.  Release the tension slowly. Relax your shoulder muscles completely before you begin the next  repetition. Repeat __________ times. Complete this exercise __________ times per day.  STRENGTH - External Rotators, Isometric  Keep your right / left elbow at your side and bend it 90 degrees.  Step into a door frame so that the outside of your right / left wrist can press against the door frame without your upper arm leaving your side.  Gently press your right / left wrist into the door frame, as if you were trying to swing the back of your hand away from your stomach. Gradually increase the tension, until you are pressing as hard as you can, without shrugging your shoulder or increasing any shoulder discomfort.  Hold for __________ seconds.  Release the tension slowly. Relax your shoulder muscles completely before you begin  the next repetition. Repeat __________ times. Complete this exercise __________ times per day.  STRENGTH - Supraspinatus   Stand or sit with good posture. Grasp a __________ weight, or an exercise band or tubing, so that your hand is "thumbs-up," like you are shaking hands.  Slowly lift your right / left arm in a "V" away from your thigh, diagonally into the space between your side and straight ahead. Lift your hand to shoulder height or as far as you can, without increasing any shoulder pain. At first, many people do not lift their hands above shoulder height.  Avoid shrugging your right / left shoulder as your arm rises, by keeping your shoulder blade tucked down and toward your mid-back spine.  Hold for __________ seconds. Control the descent of your hand, as you slowly return to your starting position. Repeat __________ times. Complete this exercise __________ times per day.  STRENGTH - External Rotators  Secure a rubber exercise band or tubing to a fixed object (table, pole) so that it is at the same height as your right / left elbow when you are standing or sitting on a firm surface.  Stand or sit so that the secured exercise band is at your uninjured  side.  Bend your right / left elbow 90 degrees. Place a folded towel or small pillow under your right / left arm, so that your elbow is a few inches away from your side.  Keeping the tension on the exercise band, pull it away from your body, as if pivoting on your elbow. Be sure to keep your body steady, so that the movement is coming only from your rotating shoulder.  Hold for __________ seconds. Release the tension in a controlled manner, as you return to the starting position. Repeat __________ times. Complete this exercise __________ times per day.  STRENGTH - Internal Rotators   Secure a rubber exercise band or tubing to a fixed object (table, pole) so that it is at the same height as your right / left elbow when you are standing or sitting on a firm surface.  Stand or sit so that the secured exercise band is at your right / left side.  Bend your elbow 90 degrees. Place a folded towel or small pillow under your right / left arm so that your elbow is a few inches away from your side.  Keeping the tension on the exercise band, pull it across your body, toward your stomach. Be sure to keep your body steady, so that the movement is coming only from your rotating shoulder.  Hold for __________ seconds. Release the tension in a controlled manner, as you return to the starting position. Repeat __________ times. Complete this exercise __________ times per day.  STRENGTH - Scapular Protractors, Standing   Stand arms length away from a wall. Place your hands on the wall, keeping your elbows straight.  Begin by dropping your shoulder blades down and toward your mid-back spine.  To strengthen your protractors, keep your shoulder blades down, but slide them forward on your rib cage. It will feel as if you are lifting the back of your rib cage away from the wall. This is a subtle motion and can be challenging to complete. Ask your caregiver for further instruction, if you are not sure you are doing  the exercise correctly.  Hold for __________ seconds. Slowly return to the starting position, resting the muscles completely before starting the next repetition. Repeat __________ times. Complete this exercise __________ times per  day. STRENGTH - Scapular Protractors, Supine  Lie on your back on a firm surface. Extend your right / left arm straight into the air while holding a __________ weight in your hand.  Keeping your head and back in place, lift your shoulder off the floor.  Hold for __________ seconds. Slowly return to the starting position, and allow your muscles to relax completely before starting the next repetition. Repeat __________ times. Complete this exercise __________ times per day. STRENGTH - Scapular Protractors, Quadruped  Get onto your hands and knees, with your shoulders directly over your hands (or as close as you can be, comfortably).  Keeping your elbows locked, lift the back of your rib cage up into your shoulder blades, so your mid-back rounds out. Keep your neck muscles relaxed.  Hold this position for __________ seconds. Slowly return to the starting position and allow your muscles to relax completely before starting the next repetition. Repeat __________ times. Complete this exercise __________ times per day.  STRENGTH - Scapular Retractors  Secure a rubber exercise band or tubing to a fixed object (table, pole), so that it is at the height of your shoulders when you are either standing, or sitting on a firm armless chair.  With a palm down grip, grasp an end of the band in each hand. Straighten your elbows and lift your hands straight in front of you, at shoulder height. Step back, away from the secured end of the band, until it becomes tense.  Squeezing your shoulder blades together, draw your elbows back toward your sides, as you bend them. Keep your upper arms lifted away from your body throughout the exercise.  Hold for __________ seconds. Slowly ease the  tension on the band, as you reverse the directions and return to the starting position. Repeat __________ times. Complete this exercise __________ times per day. STRENGTH - Shoulder Extensors   Secure a rubber exercise band or tubing to a fixed object (table, pole) so that it is at the height of your shoulders when you are either standing, or sitting on a firm armless chair.  With a thumbs-up grip, grasp an end of the band in each hand. Straighten your elbows and lift your hands straight in front of you, at shoulder height. Step back, away from the secured end of the band, until it becomes tense.  Squeezing your shoulder blades together, pull your hands down to the sides of your thighs. Do not allow your hands to go behind you.  Hold for __________ seconds. Slowly ease the tension on the band, as you reverse the directions and return to the starting position. Repeat __________ times. Complete this exercise __________ times per day.  STRENGTH - Scapular Retractors and External Rotators   Secure a rubber exercise band or tubing to a fixed object (table, pole) so that it is at the height as your shoulders, when you are either standing, or sitting on a firm armless chair.  With a palm down grip, grasp an end of the band in each hand. Bend your elbows 90 degrees and lift your elbows to shoulder height, at your sides. Step back, away from the secured end of the band, until it becomes tense.  Squeezing your shoulder blades together, rotate your shoulders so that your upper arms and elbows remain stationary, but your fists travel upward to head height.  Hold for __________ seconds. Slowly ease the tension on the band, as you reverse the directions and return to the starting position. Repeat __________ times.  Complete this exercise __________ times per day.  STRENGTH - Scapular Retractors and External Rotators, Rowing   Secure a rubber exercise band or tubing to a fixed object (table, pole) so that it  is at the height of your shoulders, when you are either standing, or sitting on a firm armless chair.  With a palm down grip, grasp an end of the band in each hand. Straighten your elbows and lift your hands straight in front of you, at shoulder height. Step back, away from the secured end of the band, until it becomes tense.  Step 1: Squeeze your shoulder blades together. Bending your elbows, draw your hands to your chest, as if you are rowing a boat. At the end of this motion, your hands and elbow should be at shoulder height and your elbows should be out to your sides.  Step 2: Rotate your shoulders, to raise your hands above your head. Your forearms should be vertical and your upper arms should be horizontal.  Hold for __________ seconds. Slowly ease the tension on the band, as you reverse the directions and return to the starting position. Repeat __________ times. Complete this exercise __________ times per day.  STRENGTH - Scapular Depressors  Find a sturdy chair without wheels, such as a dining room chair.  Keeping your feet on the floor, and your hands on the chair arms, lift your bottom up from the seat, and lock your elbows.  Keeping your elbows straight, allow gravity to pull your body weight down. Your shoulders will rise toward your ears.  Raise your body against gravity by drawing your shoulder blades down your back, shortening the distance between your shoulders and ears. Although your feet should always maintain contact with the floor, your feet should progressively support less body weight, as you get stronger.  Hold for __________ seconds. In a controlled and slow manner, lower your body weight to begin the next repetition. Repeat __________ times. Complete this exercise __________ times per day.    This information is not intended to replace advice given to you by your health care provider. Make sure you discuss any questions you have with your health care provider.    Document Released: 12/22/2004 Document Revised: 01/12/2014 Document Reviewed: 04/05/2008 Elsevier Interactive Patient Education [employer].

## 2016-12-31 ENCOUNTER — Encounter: Payer: Self-pay | Admitting: Physician Assistant

## 2016-12-31 ENCOUNTER — Other Ambulatory Visit: Payer: Self-pay | Admitting: Physician Assistant

## 2016-12-31 ENCOUNTER — Ambulatory Visit: Payer: 59 | Admitting: Physician Assistant

## 2016-12-31 ENCOUNTER — Ambulatory Visit (HOSPITAL_COMMUNITY)
Admission: RE | Admit: 2016-12-31 | Discharge: 2016-12-31 | Disposition: A | Discharge status: Home / Self Care | Payer: 59 | Source: Ambulatory Visit | Attending: Physician Assistant | Admitting: Physician Assistant

## 2016-12-31 ENCOUNTER — Other Ambulatory Visit: Payer: Self-pay

## 2016-12-31 ENCOUNTER — Ambulatory Visit (INDEPENDENT_AMBULATORY_CARE_PROVIDER_SITE_OTHER): Payer: 59

## 2016-12-31 VITALS — BP 130/88 | HR 96 | Temp 98.4°F | Resp 16 | Ht 59.0 in | Wt 150.8 lb

## 2016-12-31 DIAGNOSIS — Z9049 Acquired absence of other specified parts of digestive tract: Secondary | ICD-10-CM | POA: Insufficient documentation

## 2016-12-31 DIAGNOSIS — Z975 Presence of (intrauterine) contraceptive device: Secondary | ICD-10-CM | POA: Diagnosis not present

## 2016-12-31 DIAGNOSIS — R109 Unspecified abdominal pain: Secondary | ICD-10-CM | POA: Insufficient documentation

## 2016-12-31 DIAGNOSIS — N200 Calculus of kidney: Secondary | ICD-10-CM

## 2016-12-31 DIAGNOSIS — R319 Hematuria, unspecified: Secondary | ICD-10-CM | POA: Diagnosis not present

## 2016-12-31 LAB — POCT URINE PREGNANCY: Preg Test, Ur: NEGATIVE

## 2016-12-31 LAB — POCT CBC
Granulocyte percent: 68.2 % (ref 37–80)
HCT, POC: 40 % (ref 37.7–47.9)
Hemoglobin: 13.5 g/dL (ref 12.2–16.2)
Lymph, poc: 2.7 (ref 0.6–3.4)
MCH, POC: 29.8 pg (ref 27–31.2)
MCHC: 33.6 g/dL (ref 31.8–35.4)
MCV: 88.7 fL (ref 80–97)
MID (cbc): 0.5 (ref 0–0.9)
MPV: 8.3 fL (ref 0–99.8)
POC Granulocyte: 7 — AB (ref 2–6.9)
POC LYMPH PERCENT: 26.8 %L (ref 10–50)
POC MID %: 5 % (ref 0–12)
Platelet Count, POC: 329 10*3/uL (ref 142–424)
RBC: 4.51 M/uL (ref 4.04–5.48)
RDW, POC: 13.3 %
WBC: 10.2 10*3/uL (ref 4.6–10.2)

## 2016-12-31 LAB — POCT URINALYSIS DIP (MANUAL ENTRY)
Bilirubin, UA: NEGATIVE
Glucose, UA: NEGATIVE mg/dL
Ketones, POC UA: NEGATIVE mg/dL
Leukocytes, UA: NEGATIVE
Nitrite, UA: NEGATIVE
Protein Ur, POC: NEGATIVE mg/dL
Spec Grav, UA: 1.015 (ref 1.010–1.025)
Urobilinogen, UA: 0.2 E.U./dL
pH, UA: 8.5 — AB (ref 5.0–8.0)

## 2016-12-31 LAB — POC MICROSCOPIC URINALYSIS (UMFC): Mucus: ABSENT

## 2016-12-31 MED ORDER — TAMSULOSIN HCL 0.4 MG PO CAPS
0.4000 mg | ORAL_CAPSULE | Freq: Every day | ORAL | 0 refills | Status: DC
Start: 2016-12-31 — End: 2017-02-12

## 2016-12-31 MED ORDER — HYDROCODONE-ACETAMINOPHEN 10-325 MG PO TABS: 1.0000 | ORAL_TABLET | Freq: Three times a day (TID) | ORAL | 0 refills | Status: DC | PRN

## 2016-12-31 NOTE — Progress Notes (Signed)
Spoke with pt on phone. Plan to treat with Flomax 0.4mg  qd. She will strain urine and bring stone to clinic for send out to lab.

## 2016-12-31 NOTE — Progress Notes (Signed)
[name]  MRN: [id number] DOB: 08-13-1970  PCP: Default, Provider, MD  Subjective:  Pt is a 46 year old female who presents to clinic for right sided low back pain x 2 days. She was "just standing there" when the pain started. Pain is getting worse and is constant. Pain is "stabbing" and is 7/10 on a 10/10 scale. Does not radiate.  Took Aleve - not helping. She denies blood in her urine, fever, chills, abdominal pain.  Pt has h/o kidney stones. Last kidney stone 03/2014.   Review of Systems  Constitutional: Negative for chills, fatigue and fever.  Gastrointestinal: Negative for abdominal pain, diarrhea, nausea and vomiting.  Genitourinary: Positive for flank pain. Negative for decreased urine volume, difficulty urinating, dysuria, enuresis, frequency, hematuria and urgency.    Patient Active Problem List   Diagnosis Date Noted  . Pain in thoracic spine 06/22/2012  . Lumbago 06/22/2012    Current Outpatient Medications on File Prior to Visit  Medication Sig Dispense Refill  . levonorgestrel (MIRENA) 20 MCG/24HR IUD 1 each by Intrauterine route once.    . cyclobenzaprine (FLEXERIL) 5 MG tablet Take 1-2 tablets (5-10 mg total) by mouth 3 (three) times daily as needed for muscle spasms. (Patient not taking: Reported on 12/31/2016) 30 tablet 1  . meloxicam (MOBIC) 7.5 MG tablet Take 1-2 tablets (7.5-15 mg total) by mouth daily. (Patient not taking: Reported on 12/31/2016) 30 tablet 0  . metFORMIN (GLUCOPHAGE) 500 MG tablet Take 500 mg by mouth 2 (two) times daily with a meal. Reported on 03/22/2015     No current facility-administered medications on file prior to visit.     Allergies  Allergen Reactions  . Tramadol Hives, Itching and Rash     Objective:  BP 130/88   Pulse 96   Temp 98.4 F (36.9 C) (Oral)   Resp 16   Ht 4\' 11"  (1.499 m)   Wt 150 lb 12.8 oz (68.4 kg)   SpO2 97%   BMI 30.46 kg/m   Physical Exam  Constitutional: She is oriented to person, place, and  time and well-developed, well-nourished, and in no distress. No distress.  Cardiovascular: Normal rate, regular rhythm and normal heart sounds.  Abdominal: Soft. Normal appearance. There is no tenderness. There is CVA tenderness (mild, right side).  Neurological: She is alert and oriented to person, place, and time. GCS score is 15.  Skin: Skin is warm and dry.  Psychiatric: Mood, memory, affect and judgment normal.  Vitals reviewed.   Results for orders placed or performed in visit on 12/31/16  POCT urinalysis dipstick  Result Value Ref Range   Color, UA yellow yellow   Clarity, UA cloudy (A) clear   Glucose, UA negative negative mg/dL   Bilirubin, UA negative negative   Ketones, POC UA negative negative mg/dL   Spec Grav, UA 1.015 1.010 - 1.025   Blood, UA trace-intact (A) negative   pH, UA 8.5 (A) 5.0 - 8.0   Protein Ur, POC negative negative mg/dL   Urobilinogen, UA 0.2 0.2 or 1.0 E.U./dL   Nitrite, UA Negative Negative   Leukocytes, UA Negative Negative  POCT Microscopic Urinalysis ([facility])  Result Value Ref Range   WBC,UR,HPF,POC Few (A) None WBC/hpf   RBC,UR,HPF,POC None None RBC/hpf   Bacteria Many (A) None, Too numerous to count   Mucus Absent Absent   Epithelial Cells, UR Per Microscopy Moderate (A) None, Too numerous to count cells/hpf   Dg Abd 1 View  Result Date:  12/31/2016 CLINICAL DATA:  Flank pain, assess for kidney stone. EXAM: ABDOMEN - 1 VIEW COMPARISON:  Abdominal radiograph April 22, 2014 FINDINGS: Multiple subcentimeter calcifications projecting RIGHT kidney. A few punctate calcifications projecting in LEFT upper quadrant. Surgical clips in the included right abdomen compatible with cholecystectomy. Included bowel gas pattern is nondilated and nonobstructive. No intra-abdominal mass effect. IUD projects in central pelvis. Soft tissue planes and included osseous structures are nonsuspicious. IMPRESSION: Multiple subcentimeter suspected RIGHT nephrolithiasis.  Possible punctate LEFT nephrolithiasis. Normal bowel gas pattern. Electronically Signed   By: Elon Alas M.D.   On: 12/31/2016 16:31    Assessment and Plan :  1. Flank pain 2. Hematuria, unspecified type - POCT urinalysis dipstick - POCT Microscopic Urinalysis ([facility]) - Urine Culture - DG Abd 1 View; Future - POCT CBC - CT Abdomen Pelvis Wo Contrast; Future - POCT urine pregnancy - HYDROcodone-acetaminophen (NORCO) 10-325 MG tablet; Take 1 tablet by mouth every 8 (eight) hours as needed.  Dispense: 30 tablet; Refill: 0 - Pt presents with worsening right sided flank pain x 2 days. Vital signs are stable. X-ray positive for multiple subcentimeter suspected R nephrolithiasis. UA +hematuria. Suspect possible uncomplicated kidney stone. She is stable at this time. Will send for CT and will contact with results and plan.   Mercer Pod, PA-C  [facility] 12/31/2016 3:34 PM

## 2016-12-31 NOTE — Patient Instructions (Addendum)
  Please proceed to [facility] as soon as possible for your CT. DO NOT CHECK INTO EMERGENCY DEPARTMENT. Go to the radiology department.  [address], [place], [place] [address]  Thank you for coming in today. I hope you feel we met your needs.  Feel free to call PCP if you have any questions or further requests.  Please consider signing up for MyChart if you do not already have it, as this is a great way to communicate with me.  Best,  Whitney McVey, PA-C   IF you received an x-ray today, you will receive an invoice from [facility]. Please contact [facility] Radiology at [phone] with questions or concerns regarding your invoice.   IF you received labwork today, you will receive an invoice from [place]. Please contact [facility] at [phone] with questions or concerns regarding your invoice.   Our billing staff will not be able to assist you with questions regarding bills from these companies.  You will be contacted with the lab results as soon as they are available. The fastest way to get your results is to activate your My Chart account. Instructions are located on the last page of this paperwork. If you have not heard from [place] regarding the results in 2 weeks, please contact this office.

## 2017-01-01 ENCOUNTER — Telehealth: Payer: Self-pay | Admitting: Physician Assistant

## 2017-01-01 LAB — URINE CULTURE

## 2017-01-01 NOTE — Telephone Encounter (Signed)
Pt's STAT CT abdomen pelvis w/o contrast did not get approved for retro-auth since it was performed yesterday before auth was received and still during normal business hours for [facility]. They were unable to initiate a retro-auth but said we could fax clinicals for reconsideration to [phone]. I have faxed these with the case number [id number] and reference number [id number]. They should respond to [place] with a determination. Thanks!

## 2017-01-18 ENCOUNTER — Encounter: Payer: Self-pay | Admitting: Physician Assistant

## 2017-01-18 ENCOUNTER — Other Ambulatory Visit: Payer: Self-pay

## 2017-01-18 ENCOUNTER — Ambulatory Visit: Payer: 59 | Admitting: Physician Assistant

## 2017-01-18 VITALS — BP 150/93 | HR 96 | Resp 16 | Ht 59.0 in | Wt 152.2 lb

## 2017-01-18 DIAGNOSIS — B9789 Other viral agents as the cause of diseases classified elsewhere: Secondary | ICD-10-CM | POA: Diagnosis not present

## 2017-01-18 DIAGNOSIS — J069 Acute upper respiratory infection, unspecified: Secondary | ICD-10-CM | POA: Diagnosis not present

## 2017-01-18 MED ORDER — CETIRIZINE-PSEUDOEPHEDRINE ER 5-120 MG PO TB12: 1.0000 | ORAL_TABLET | Freq: Two times a day (BID) | ORAL | 0 refills | Status: AC

## 2017-01-18 MED ORDER — CETIRIZINE-PSEUDOEPHEDRINE ER 5-120 MG PO TB12: 1.0000 | ORAL_TABLET | Freq: Two times a day (BID) | ORAL | 0 refills | Status: DC

## 2017-01-18 MED ORDER — BENZONATATE 200 MG PO CAPS: 200.0000 mg | ORAL_CAPSULE | Freq: Two times a day (BID) | ORAL | 0 refills | Status: DC | PRN

## 2017-01-18 NOTE — Addendum Note (Signed)
Addended by: Areta Haber on: 01/18/2017 01:01 PM   Modules accepted: Orders

## 2017-01-18 NOTE — Progress Notes (Signed)
    01/18/2017 12:50 PM   DOB: 1970-08-01 / MRN: [id number]  SUBJECTIVE:  [name] is a 47 y.o. female presenting for cough, sore throat, nasal congestion, myalgia that has been present for about 3 days and is worsening somewhat.  She has tried [employer] tells me this helps some but relief is transient.  She would like a note for work.  She denies a history of asthma.  She is allergic to tramadol; hydrocodone-acetaminophen; and flomax [tamsulosin hcl].   She  has a past medical history of Back pain, Chronic kidney disease, Diabetes mellitus without complication ([place]), and History of kidney stones.    She  reports that  has never smoked. she has never used smokeless tobacco. She reports that she does not drink alcohol or use drugs. She  reports that she currently engages in sexual activity. She reports using the following method of birth control/protection: IUD. The patient  has a past surgical history that includes Cholecystectomy, laparoscopic and Cystoscopy with ureteroscopy and stent placement (Bilateral, 07/29/2012).  Her family history is not on file.  Review of Systems  Constitutional: Negative for chills and fever.  Respiratory: Negative for shortness of breath.   Cardiovascular: Negative for chest pain.  Skin: Negative for rash.  Neurological: Negative for dizziness.    The problem list and medications were reviewed and updated by myself where necessary and exist elsewhere in the encounter.   OBJECTIVE:  Pulse 96   Resp 16   Ht 4\' 11"  (1.499 m)   Wt 152 lb 3.2 oz (69 kg)   SpO2 98%   BMI 30.74 kg/m   Lab Results  Component Value Date   CREATININE 0.63 01/14/2015     Physical Exam  Constitutional: She is active.  Non-toxic appearance.  HENT:  Right Ear: Hearing, tympanic membrane, external ear and ear canal normal.  Left Ear: Hearing, tympanic membrane, external ear and ear canal normal.  Nose: Nose normal. Right sinus exhibits no maxillary sinus tenderness  and no frontal sinus tenderness. Left sinus exhibits no maxillary sinus tenderness and no frontal sinus tenderness.  Mouth/Throat: Uvula is midline, oropharynx is clear and moist and mucous membranes are normal. Mucous membranes are not dry. No oropharyngeal exudate, posterior oropharyngeal edema or tonsillar abscesses.  Cardiovascular: Normal rate, regular rhythm, S1 normal, S2 normal, normal heart sounds and intact distal pulses. Exam reveals no gallop, no friction rub and no decreased pulses.  No murmur heard. Pulmonary/Chest: Effort normal. No tachypnea. She has no rales.  Abdominal: She exhibits no distension.  Musculoskeletal: She exhibits no edema.  Lymphadenopathy:       Head (right side): No submandibular and no tonsillar adenopathy present.       Head (left side): No submandibular and no tonsillar adenopathy present.    She has no cervical adenopathy.  Neurological: She is alert.  Skin: Skin is warm and dry. She is not diaphoretic. No pallor.    No results found for this or any previous visit (from the past 72 hour(s)).  No results found.  ASSESSMENT AND PLAN:  There are no diagnoses linked to this encounter.  The patient is advised to call or return to clinic if she does not see an improvement in symptoms, or to seek the care of the closest emergency department if she worsens with the above plan.   Philis Fendt, MHS, PA-C [facility] 01/18/2017 12:50 PM

## 2017-01-18 NOTE — Patient Instructions (Addendum)
  I could not prescribe you any cough syrup today because of some allergies noted in your chart that include hives.  I have prescribed you Tessalon which should be effective at reducing your cough.  It is okay for you to continue Tylenol 1000 mg every 8 hours as needed for fever and muscle aches.  I expect that she will be feeling better within the next 4-5 days.  If your pressure at home is continually greater than 140/90, please return with BP log so that we can consider starting you on a medication.  IF you received an x-ray today, you will receive an invoice from [facility] Radiology. Please contact [facility] Radiology at [phone] with questions or concerns regarding your invoice.   IF you received labwork today, you will receive an invoice from [place]. Please contact [facility] at [phone] with questions or concerns regarding your invoice.   Our billing staff will not be able to assist you with questions regarding bills from these companies.  You will be contacted with the lab results as soon as they are available. The fastest way to get your results is to activate your My Chart account. Instructions are located on the last page of this paperwork. If you have not heard from [place] regarding the results in 2 weeks, please contact this office.

## 2017-02-02 ENCOUNTER — Encounter: Payer: Self-pay | Admitting: *Deleted

## 2017-02-02 ENCOUNTER — Encounter: Payer: Self-pay | Admitting: Obstetrics & Gynecology

## 2017-02-02 ENCOUNTER — Ambulatory Visit (INDEPENDENT_AMBULATORY_CARE_PROVIDER_SITE_OTHER): Payer: 59 | Admitting: Obstetrics & Gynecology

## 2017-02-02 ENCOUNTER — Other Ambulatory Visit: Payer: Self-pay | Admitting: Obstetrics & Gynecology

## 2017-02-02 ENCOUNTER — Telehealth: Payer: Self-pay | Admitting: *Deleted

## 2017-02-02 VITALS — BP 153/93 | HR 96 | Ht 59.0 in | Wt 153.1 lb

## 2017-02-02 DIAGNOSIS — Z1231 Encounter for screening mammogram for malignant neoplasm of breast: Secondary | ICD-10-CM

## 2017-02-02 DIAGNOSIS — Z30433 Encounter for removal and reinsertion of intrauterine contraceptive device: Secondary | ICD-10-CM

## 2017-02-02 DIAGNOSIS — Z01419 Encounter for gynecological examination (general) (routine) without abnormal findings: Secondary | ICD-10-CM | POA: Diagnosis not present

## 2017-02-02 DIAGNOSIS — Z1151 Encounter for screening for human papillomavirus (HPV): Secondary | ICD-10-CM | POA: Diagnosis not present

## 2017-02-02 DIAGNOSIS — Z1239 Encounter for other screening for malignant neoplasm of breast: Secondary | ICD-10-CM

## 2017-02-02 DIAGNOSIS — N852 Hypertrophy of uterus: Secondary | ICD-10-CM

## 2017-02-02 DIAGNOSIS — Z124 Encounter for screening for malignant neoplasm of cervix: Secondary | ICD-10-CM | POA: Diagnosis not present

## 2017-02-02 DIAGNOSIS — R21 Rash and other nonspecific skin eruption: Secondary | ICD-10-CM

## 2017-02-02 MED ORDER — LEVONORGESTREL 19.5 MCG/DAY IU IUD
INTRAUTERINE_SYSTEM | Freq: Once | INTRAUTERINE | Status: AC
  Administered 2017-02-02: 1 via INTRAUTERINE

## 2017-02-02 NOTE — Progress Notes (Signed)
Subjective:     [name] is a 47 y.o. female here for a routine exam.  [id number] Current complaints: no problems. Wants IUD removed and replaced.   Pt had not been seen by a GYN since her IUD was placed by me in 01/2012. She denies GYN problems. She reports that she has a primary care provider however she only goes there when she is ill. She had a breast cyst drained. She thought it was recently but, the records supports that it was in 2006.  She wants her LnIUD removed and replaced. She denies menses on the IUD. Discussed risks of irregular bleeding, cramping, infection, malpositioning or misplacement of the IUD outside the uterus which may require further procedures.  Pt reports rash on her shins bilaterally. She reports that she was told that it is eczema.      Gynecologic History No LMP recorded (lmp unknown). Patient is not currently having periods (Reason: IUD). Contraception: IUD Last Pap: prior to 2014 Last mammogram: 03/27/2004. Results were: normal  Obstetric History OB History  Gravida Para Term Preterm AB Living  4 1 1   3 1   SAB TAB Ectopic Multiple Live Births    3          # Outcome Date GA Lbr Len/2nd Weight Sex Delivery Anes PTL Lv  4 TAB           3 TAB           2 TAB           1 Term              The following portions of the patient's history were reviewed and updated as appropriate: allergies, current medications, past family history, past medical history, past social history, past surgical history and problem list.  Review of Systems Pertinent items are noted in HPI.    Objective:  BP (!) 153/93   Pulse 96   Ht 4\' 11"  (1.499 m)   Wt 153 lb 1.6 oz (69.4 kg)   LMP  (LMP Unknown)   BMI 30.92 kg/m   General Appearance:    Alert, cooperative, no distress, appears stated age  Head:    Normocephalic, without obvious abnormality, atraumatic  Eyes:    conjunctiva/corneas clear, EOM's intact, both eyes  Ears:    Normal external ear canals, both ears  Nose:    Nares normal, septum midline, mucosa normal, no drainage    or sinus tenderness  Throat:   Lips, mucosa, and tongue normal; teeth and gums normal  Neck:   Supple, symmetrical, trachea midline, no adenopathy;    thyroid:  no enlargement/tenderness/nodules  Back:     Symmetric, no curvature, ROM normal, no CVA tenderness  Lungs:     Clear to auscultation bilaterally, respirations unlabored  Chest Wall:    No tenderness or deformity   Heart:    Regular rate and rhythm, S1 and S2 normal, no murmur, rub   or gallop  Breast Exam:    No tenderness, masses, or nipple abnormality  Abdomen:     Soft, non-tender, bowel sounds active all four quadrants,    no masses, no organomegaly  Genitalia:    Normal female without lesion, discharge or tenderness; her uterus is enlarged to 10-12 weeks sized.     Extremities:   Extremities normal, atraumatic, no cyanosis or edema  Pulses:   2+ and symmetric all extremities  Skin:   Skin color, texture, turgor normal, no rashes or  lesions; pt has some tattoos on her upper chest on the left and on her extremities. There is a hyperpigmented rash on her shins bilaterally.      GYNECOLOGY CLINIC PROCEDURE NOTE  Patient identified, informed consent performed.  Time out was performed.  Patient was in the dorsal lithotomy position, normal external genitalia was noted.  A speculum was placed in the patient's vagina, normal discharge was noted, no lesions. The parous cervix was visualized, no lesions, no abnormal discharge;  and the cervix was swabbed with Betadine using scopettes. The strings of the IUD were not visualized, so Kelly forceps were introduced into the endometrial cavity and the IUD was grasped and removed in its entirety.    IUD Insertion Procedure Note Speculum placed in the vagina.  Cervix visualized.  Cleaned with Betadine x 2.  Grasped anteriorly with a single tooth tenaculum.  Uterus sounded to 8cm.  Liletta IUD placed per manufacturer's recommendations.   Strings trimmed to 3 cm. Tenaculum was removed, good hemostasis noted.  Patient tolerated procedure well.   Assessment:    Healthy female exam.   Contraception management- LnIUD removed and replaced Breast cancer screening Derm referral for rash on lower extremities    Plan:    Follow up in: 1 year.  Screening mammogram Labs: CBC, CMP, TSH, HgbA1c, Lipid panel F/u in 1 year Derm referral   Javious Hallisey L. Harraway-Smith, M.D., [facility] June

## 2017-02-02 NOTE — Patient Instructions (Signed)
Levonorgestrel intrauterine device (IUD) What is this medicine? LEVONORGESTREL IUD (LEE voe nor jes trel) is a contraceptive (birth control) device. The device is placed inside the uterus by a healthcare professional. It is used to prevent pregnancy. This device can also be used to treat heavy bleeding that occurs during your period. This medicine may be used for other purposes; ask your health care provider or pharmacist if you have questions. COMMON BRAND NAME(S): Kyleena, LILETTA, Mirena, Skyla What should I tell my health care provider before I take this medicine? They need to know if you have any of these conditions: -abnormal Pap smear -cancer of the breast, uterus, or cervix -diabetes -endometritis -genital or pelvic infection now or in the past -have more than one sexual partner or your partner has more than one partner -heart disease -history of an ectopic or tubal pregnancy -immune system problems -IUD in place -liver disease or tumor -problems with blood clots or take blood-thinners -seizures -use intravenous drugs -uterus of unusual shape -vaginal bleeding that has not been explained -an unusual or allergic reaction to levonorgestrel, other hormones, silicone, or polyethylene, medicines, foods, dyes, or preservatives -pregnant or trying to get pregnant -breast-feeding How should I use this medicine? This device is placed inside the uterus by a health care professional. Talk to your pediatrician regarding the use of this medicine in children. Special care may be needed. Overdosage: If you think you have taken too much of this medicine contact a poison control center or emergency room at once. NOTE: This medicine is only for you. Do not share this medicine with others. What if I miss a dose? This does not apply. Depending on the brand of device you have inserted, the device will need to be replaced every 3 to 5 years if you wish to continue using this type of birth  control. What may interact with this medicine? Do not take this medicine with any of the following medications: -amprenavir -bosentan -fosamprenavir This medicine may also interact with the following medications: -aprepitant -armodafinil -barbiturate medicines for inducing sleep or treating seizures -bexarotene -boceprevir -griseofulvin -medicines to treat seizures like carbamazepine, ethotoin, felbamate, oxcarbazepine, phenytoin, topiramate -modafinil -pioglitazone -rifabutin -rifampin -rifapentine -some medicines to treat HIV infection like atazanavir, efavirenz, indinavir, lopinavir, nelfinavir, tipranavir, ritonavir -St. John's wort -warfarin This list may not describe all possible interactions. Give your health care provider a list of all the medicines, herbs, non-prescription drugs, or dietary supplements you use. Also tell them if you smoke, drink alcohol, or use illegal drugs. Some items may interact with your medicine. What should I watch for while using this medicine? Visit your doctor or health care professional for regular check ups. See your doctor if you or your partner has sexual contact with others, becomes HIV positive, or gets a sexual transmitted disease. This product does not protect you against HIV infection (AIDS) or other sexually transmitted diseases. You can check the placement of the IUD yourself by reaching up to the top of your vagina with clean fingers to feel the threads. Do not pull on the threads. It is a good habit to check placement after each menstrual period. Call your doctor right away if you feel more of the IUD than just the threads or if you cannot feel the threads at all. The IUD may come out by itself. You may become pregnant if the device comes out. If you notice that the IUD has come out use a backup birth control method like condoms and call your   health care provider. Using tampons will not change the position of the IUD and are okay to use  during your period. This IUD can be safely scanned with magnetic resonance imaging (MRI) only under specific conditions. Before you have an MRI, tell your healthcare provider that you have an IUD in place, and which type of IUD you have in place. What side effects may I notice from receiving this medicine? Side effects that you should report to your doctor or health care professional as soon as possible: -allergic reactions like skin rash, itching or hives, swelling of the face, lips, or tongue -fever, flu-like symptoms -genital sores -high blood pressure -no menstrual period for 6 weeks during use -pain, swelling, warmth in the leg -pelvic pain or tenderness -severe or sudden headache -signs of pregnancy -stomach cramping -sudden shortness of breath -trouble with balance, talking, or walking -unusual vaginal bleeding, discharge -yellowing of the eyes or skin Side effects that usually do not require medical attention (report to your doctor or health care professional if they continue or are bothersome): -acne -breast pain -change in sex drive or performance -changes in weight -cramping, dizziness, or faintness while the device is being inserted -headache -irregular menstrual bleeding within first 3 to 6 months of use -nausea This list may not describe all possible side effects. Call your doctor for medical advice about side effects. You may report side effects to FDA at 1-800-FDA-1088. Where should I keep my medicine? This does not apply. NOTE: This sheet is a summary. It may not cover all possible information. If you have questions about this medicine, talk to your doctor, pharmacist, or health care provider.  2018 Elsevier/Gold Standard (2015-10-04 14:14:56) IUD PLACEMENT POST-PROCEDURE INSTRUCTIONS  1. You may take Ibuprofen, Aleve or Tylenol for pain if needed.  Cramping should resolve within in 24 hours.  2. You may have a small amount of spotting.  You should wear a mini  pad for the next few days.  3. You may have intercourse after 24 hours.  If you using this for birth control, it is effective immediately.  4. You need to call if you have any pelvic pain, fever, heavy bleeding or foul smelling vaginal discharge.  Irregular bleeding is common the first several months after having an IUD placed. You do not need to call for this reason unless you are concerned.  5. Shower or bathe as normal  6. You should have a follow-up appointment in 4-8 weeks for a re-check to make sure you are not having any problems. 

## 2017-02-02 NOTE — Telephone Encounter (Signed)
Patient has an appointment with dermatology on 12/22/17@1000 . Attempted to call patient but there was no answer and her mailbox was full.

## 2017-02-02 NOTE — Addendum Note (Signed)
Addended by: Riccardo Dubin on: 02/02/2017 11:49 AM   Modules accepted: Orders

## 2017-02-03 LAB — COMPREHENSIVE METABOLIC PANEL
A/G RATIO: 1.6 (ref 1.2–2.2)
ALBUMIN: 4.6 g/dL (ref 3.5–5.5)
ALK PHOS: 74 IU/L (ref 39–117)
ALT: 19 IU/L (ref 0–32)
AST: 15 IU/L (ref 0–40)
BUN / CREAT RATIO: 20 (ref 9–23)
BUN: 16 mg/dL (ref 6–24)
Bilirubin Total: 0.2 mg/dL (ref 0.0–1.2)
CO2: 26 mmol/L (ref 20–29)
CREATININE: 0.82 mg/dL (ref 0.57–1.00)
Calcium: 9.9 mg/dL (ref 8.7–10.2)
Chloride: 102 mmol/L (ref 96–106)
GFR calc Af Amer: 99 mL/min/{1.73_m2} (ref >59)
GFR, EST NON AFRICAN AMERICAN: 86 mL/min/{1.73_m2} (ref >59)
Globulin, Total: 2.8 g/dL (ref 1.5–4.5)
Glucose: 85 mg/dL (ref 65–99)
POTASSIUM: 3.8 mmol/L (ref 3.5–5.2)
Sodium: 145 mmol/L — ABNORMAL HIGH (ref 134–144)
Total Protein: 7.4 g/dL (ref 6.0–8.5)

## 2017-02-03 LAB — LIPID PANEL
CHOL/HDL RATIO: 3.6 ratio (ref 0.0–4.4)
CHOLESTEROL TOTAL: 193 mg/dL (ref 100–199)
HDL: 54 mg/dL (ref >39)
LDL CALC: 96 mg/dL (ref 0–99)
TRIGLYCERIDES: 216 mg/dL — AB (ref 0–149)
VLDL CHOLESTEROL CAL: 43 mg/dL — AB (ref 5–40)

## 2017-02-03 LAB — CBC
HEMOGLOBIN: 13.1 g/dL (ref 11.1–15.9)
Hematocrit: 39.3 % (ref 34.0–46.6)
MCH: 29 pg (ref 26.6–33.0)
MCHC: 33.3 g/dL (ref 31.5–35.7)
MCV: 87 fL (ref 79–97)
Platelets: 393 10*3/uL — ABNORMAL HIGH (ref 150–379)
RBC: 4.52 x10E6/uL (ref 3.77–5.28)
RDW: 13.4 % (ref 12.3–15.4)
WBC: 10.9 10*3/uL — ABNORMAL HIGH (ref 3.4–10.8)

## 2017-02-03 LAB — CYTOLOGY - PAP
Diagnosis: NEGATIVE
HPV: DETECTED — AB

## 2017-02-03 LAB — HEMOGLOBIN A1C
Est. average glucose Bld gHb Est-mCnc: 131 mg/dL
Hgb A1c MFr Bld: 6.2 % — ABNORMAL HIGH (ref 4.8–5.6)

## 2017-02-03 LAB — TSH: TSH: 2.88 u[IU]/mL (ref 0.450–4.500)

## 2017-02-03 NOTE — Telephone Encounter (Signed)
Called patient and informed her of appt at [facility]. Patient verbalized understanding & had no questions

## 2017-02-05 ENCOUNTER — Ambulatory Visit (HOSPITAL_COMMUNITY): Payer: 59

## 2017-02-05 DIAGNOSIS — J208 Acute bronchitis due to other specified organisms: Secondary | ICD-10-CM | POA: Diagnosis not present

## 2017-02-11 ENCOUNTER — Ambulatory Visit: Payer: 59

## 2017-02-12 ENCOUNTER — Ambulatory Visit: Payer: 59 | Admitting: Physician Assistant

## 2017-02-12 ENCOUNTER — Ambulatory Visit (INDEPENDENT_AMBULATORY_CARE_PROVIDER_SITE_OTHER): Payer: 59

## 2017-02-12 ENCOUNTER — Other Ambulatory Visit: Payer: Self-pay

## 2017-02-12 ENCOUNTER — Encounter: Payer: Self-pay | Admitting: Physician Assistant

## 2017-02-12 VITALS — HR 97 | Resp 16 | Ht 59.0 in | Wt 153.4 lb

## 2017-02-12 DIAGNOSIS — R05 Cough: Secondary | ICD-10-CM | POA: Diagnosis not present

## 2017-02-12 DIAGNOSIS — R053 Chronic cough: Secondary | ICD-10-CM

## 2017-02-12 LAB — POCT CBC
GRANULOCYTE PERCENT: 68.5 % (ref 37–80)
HEMATOCRIT: 43.7 % (ref 37.7–47.9)
Hemoglobin: 14.4 g/dL (ref 12.2–16.2)
LYMPH, POC: 2 (ref 0.6–3.4)
MCH, POC: 28.4 pg (ref 27–31.2)
MCHC: 32.9 g/dL (ref 31.8–35.4)
MCV: 86.2 fL (ref 80–97)
MID (CBC): 0.3 (ref 0–0.9)
MPV: 7.8 fL (ref 0–99.8)
PLATELET COUNT, POC: 363 10*3/uL (ref 142–424)
POC Granulocyte: 5 (ref 2–6.9)
POC LYMPH PERCENT: 27.5 %L (ref 10–50)
POC MID %: 4 %M (ref 0–12)
RBC: 5.07 M/uL (ref 4.04–5.48)
RDW, POC: 13 %
WBC: 7.3 10*3/uL (ref 4.6–10.2)

## 2017-02-12 MED ORDER — FAMOTIDINE 40 MG PO TABS: 40.0000 mg | ORAL_TABLET | Freq: Every day | ORAL | 3 refills | Status: DC

## 2017-02-12 MED ORDER — LEVOCETIRIZINE DIHYDROCHLORIDE 5 MG PO TABS: 5.0000 mg | ORAL_TABLET | Freq: Every evening | ORAL | 3 refills | Status: DC

## 2017-02-12 NOTE — Patient Instructions (Addendum)
Your cough may likely have up to 3 different causes.  It could be post viral cough.  Only time will make this better.  It could be upper airway syndrome which could be a combination of GERD or allergies.  I have given you medications to take for both GERD and allergies today.  I hope these help.  Chest x-ray was negative today.    IF you received an x-ray today, you will receive an invoice from [facility] Rad[facility]. Please contact [facility]iology at [phone] with questions or concerns regarding your invoice.   IF you received labwork today, you will receive an invoice from [place]. Please contact [facility] at [phone] with questions or concerns regarding your invoice.   Our billing staff will not be able to assist you with questions regarding bills from these companies.  You will be contacted with the lab results as soon as they are available. The fastest way to get your results is to activate your My Chart account. Instructions are located on the last page of this paperwork. If you have not heard from [place] regarding the results in 2 weeks, please contact this office.

## 2017-02-12 NOTE — Progress Notes (Signed)
02/15/2017 12:24 PM   DOB: 11-20-1970 / MRN: [id number]  SUBJECTIVE:  [name] is a 47 y.o. female presenting for cough times 1 month.  Patient states the cough is now keeping her husband up.  She tells me she needs some help today and reiterates this multiple times.  I gave her benzonatate as well as Zyrtec-D at her last visit.  She denies GERD, dysphagia, odynophagia.  Cough is worse at night and in the morning time.  She is allergic to tramadol; hydrocodone-acetaminophen; and flomax [tamsulosin hcl].   She  has a past medical history of Back pain, Chronic kidney disease, Diabetes mellitus without complication ([place]), and History of kidney stones.    She  reports that  has never smoked. she has never used smokeless tobacco. She reports that she does not drink alcohol or use drugs. She  reports that she currently engages in sexual activity. She reports using the following method of birth control/protection: IUD. The patient  has a past surgical history that includes Cholecystectomy, laparoscopic and Cystoscopy with ureteroscopy and stent placement (Bilateral, 07/29/2012).  Her family history is not on file.  Review of Systems  Constitutional: Negative for chills, diaphoresis and fever.  Respiratory: Negative for shortness of breath.   Cardiovascular: Negative for chest pain, orthopnea and leg swelling.  Gastrointestinal: Negative for abdominal pain and nausea.  Skin: Negative for rash.  Neurological: Negative for dizziness.    The problem list and medications were reviewed and updated by myself where necessary and exist elsewhere in the encounter.   OBJECTIVE:  Pulse 97   Resp 16   Ht 4\' 11"  (1.499 m)   Wt 153 lb 6.4 oz (69.6 kg)   LMP  (LMP Unknown)   SpO2 98%   BMI 30.98 kg/m   Physical Exam  Constitutional: She is active.  Non-toxic appearance.  Cardiovascular: Normal rate, regular rhythm, S1 normal, S2 normal, normal heart sounds and intact distal pulses. Exam reveals  no gallop, no friction rub and no decreased pulses.  No murmur heard. Pulmonary/Chest: Effort normal. No stridor. No tachypnea. No respiratory distress. She has no wheezes. She has no rales.  Abdominal: She exhibits no distension.  Musculoskeletal: She exhibits no edema.  Neurological: She is alert.  Skin: Skin is warm and dry. She is not diaphoretic. No pallor.    Results for orders placed or performed in visit on 02/12/17 (from the past 72 hour(s))  POCT CBC     Status: Normal   Collection Time: 02/12/17  3:33 PM  Result Value Ref Range   WBC 7.3 4.6 - 10.2 K/uL   Lymph, poc 2.0 0.6 - 3.4   POC LYMPH PERCENT 27.5 10 - 50 %L   MID (cbc) 0.3 0 - 0.9   POC MID % 4.0 0 - 12 %M   POC Granulocyte 5.0 2 - 6.9   Granulocyte percent 68.5 37 - 80 %G   RBC 5.07 4.04 - 5.48 M/uL   Hemoglobin 14.4 12.2 - 16.2 g/dL   HCT, POC 43.7 37.7 - 47.9 %   MCV 86.2 80 - 97 fL   MCH, POC 28.4 27 - 31.2 pg   MCHC 32.9 31.8 - 35.4 g/dL   RDW, POC 13.0 %   Platelet Count, POC 363 142 - 424 K/uL   MPV 7.8 0 - 99.8 fL  H. pylori breath test     Status: None   Collection Time: 02/12/17  4:58 PM  Result Value Ref Range  H pylori Breath Test Negative Negative    No results found.  ASSESSMENT AND PLAN:  Yazmin was seen today for cough.  Diagnoses and all orders for this visit:  Chronic cough: Patient has an allergy to narcotics.  Starting famotidine along with Xyzal in the hopes that this is upper airway cough syndrome.  I will see her back in 2-3 weeks and if our efforts fail I will send her to pulmonology for further evaluation and management. -     DG Chest 2 View; Future -     POCT CBC -     H. pylori breath test -     levocetirizine (XYZAL) 5 MG tablet; Take 1 tablet (5 mg total) by mouth every evening. -     famotidine (PEPCID) 40 MG tablet; Take 1 tablet (40 mg total) by mouth daily.    The patient is advised to call or return to clinic if she does not see an improvement in symptoms, or  to seek the care of the closest emergency department if she worsens with the above plan.   Philis Fendt, MHS, PA-C [facility] 02/15/2017 12:24 PM

## 2017-02-15 LAB — H. PYLORI BREATH TEST: H pylori Breath Test: NEGATIVE

## 2017-02-22 DIAGNOSIS — L709 Acne, unspecified: Secondary | ICD-10-CM | POA: Diagnosis not present

## 2017-02-22 DIAGNOSIS — L309 Dermatitis, unspecified: Secondary | ICD-10-CM | POA: Diagnosis not present

## 2017-02-23 ENCOUNTER — Telehealth: Payer: Self-pay | Admitting: General Practice

## 2017-02-23 NOTE — Telephone Encounter (Signed)
Called patient, no answer- unable to leave message as voicemail is full. Per chart review, patient has a pcp she already sees

## 2017-02-23 NOTE — Telephone Encounter (Signed)
-----   Message from Lavonia Drafts, MD sent at 02/11/2017  9:40 PM EST ----- This pt needs to be called (English speaking)  She needs to be referred to primary care:  #1  Increased triglycerides #2 she is diabetic #3 she needs to f/uin 1 year for a repeat PAP  Please document conversation and cc me. I will be away next week but can answer questions remotely. Corrin Parker, clh-S

## 2017-03-03 NOTE — Telephone Encounter (Signed)
Called patient, no answer- unable to leave message as voicemail was full. Will send letter.  

## 2017-03-18 ENCOUNTER — Telehealth: Payer: Self-pay | Admitting: Physician Assistant

## 2017-03-18 NOTE — Telephone Encounter (Signed)
[facility] to check on pt's retro auth from 12/31/16. Spoke with their appeals department who stated they did receive an appeal from [place] on 12/28 and it was processed on 01/05/17. [employer] paid $222.80. The case number for this is [id number]

## 2017-05-10 DIAGNOSIS — J181 Lobar pneumonia, unspecified organism: Secondary | ICD-10-CM | POA: Diagnosis not present

## 2017-05-13 ENCOUNTER — Ambulatory Visit
Admission: RE | Admit: 2017-05-13 | Discharge: 2017-05-13 | Disposition: A | Discharge status: Home / Self Care | Payer: 59 | Source: Ambulatory Visit | Attending: Family Medicine | Admitting: Family Medicine

## 2017-05-13 ENCOUNTER — Other Ambulatory Visit: Payer: Self-pay | Admitting: Family Medicine

## 2017-05-13 DIAGNOSIS — R059 Cough, unspecified: Secondary | ICD-10-CM

## 2017-05-13 DIAGNOSIS — R05 Cough: Secondary | ICD-10-CM

## 2017-05-20 ENCOUNTER — Encounter: Payer: Self-pay | Admitting: *Deleted

## 2017-07-12 ENCOUNTER — Ambulatory Visit: Payer: Self-pay | Admitting: *Deleted

## 2017-07-12 NOTE — Telephone Encounter (Signed)
Pt states that she had some weakness in her left hand over the weekend. It comes and goes and also she had some diarrhea yesterday. No slurred speech. No incontinent of bowel or bladder.  She denies chest pain, shortness of breath, nausea or fever. Sometimes she has a headache not now. Sometimes when she is jogging and just walking she feels like she loses her balance. She is requesting an appointment with her provider, first availability is Wednesday.  Pt advised that if she starts having more symptoms, to call back Will route to flow at [facility].   Reason for Disposition . [1] Weakness of the face, arm / hand, or leg / foot on one side of the body AND [2] gradual onset (e.g., days to weeks) AND [3] present now  Answer Assessment - Initial Assessment Questions 1. SYMPTOM: "What is the main symptom you are concerned about?" (e.g., weakness, numbness)     Weakness in left hand 2. ONSET: "When did this start?" (minutes, hours, days; while sleeping)     Started over the weekend 3. LAST NORMAL: "When was the last time you were normal (no symptoms)?"     Not sure 4. PATTERN "Does this come and go, or has it been constant since it started?"  "Is it present now?"     Comes and goes 5. CARDIAC SYMPTOMS: "Have you had any of the following symptoms: chest pain, difficulty breathing, palpitations?"     no 6. NEUROLOGIC SYMPTOMS: "Have you had any of the following symptoms: headache, dizziness, vision loss, double vision, changes in speech, unsteady on your feet?"     Headache, unbalanced 7. OTHER SYMPTOMS: "Do you have any other symptoms?"     no 8. PREGNANCY: "Is there any chance you are pregnant?" "When was your last menstrual period?"     No IUD  Protocols used: NEUROLOGIC DEFICIT-A-AH

## 2017-07-14 ENCOUNTER — Ambulatory Visit: Payer: Self-pay | Admitting: Physician Assistant

## 2017-07-16 ENCOUNTER — Other Ambulatory Visit: Payer: Self-pay

## 2017-07-16 ENCOUNTER — Ambulatory Visit: Payer: 59 | Admitting: Physician Assistant

## 2017-07-16 ENCOUNTER — Encounter: Payer: Self-pay | Admitting: Physician Assistant

## 2017-07-16 ENCOUNTER — Encounter: Payer: Self-pay | Admitting: Neurology

## 2017-07-16 VITALS — BP 132/82 | HR 93 | Temp 98.4°F | Resp 16 | Ht 60.25 in | Wt 156.2 lb

## 2017-07-16 DIAGNOSIS — M6281 Muscle weakness (generalized): Secondary | ICD-10-CM | POA: Diagnosis not present

## 2017-07-16 NOTE — Patient Instructions (Signed)
     IF you received an x-ray today, you will receive an invoice from [facility]. Please contact [facility] at [phone] with questions or concerns regarding your invoice.   IF you received labwork today, you will receive an invoice from [facility]. Please contact [facility] at [phone] with questions or concerns regarding your invoice.   Our billing staff will not be able to assist you with questions regarding bills from these companies.  You will be contacted with the lab results as soon as they are available. The fastest way to get your results is to activate your My Chart account. Instructions are located on the last page of this paperwork. If you have not heard from us regarding the results in 2 weeks, please contact this office.     

## 2017-07-16 NOTE — Progress Notes (Signed)
[name]  MRN: [id number] DOB: Jan 28, 1970  PCP: Tereasa Coop, PA-C  Subjective:  Pt is a left-handed 47 year old female who presents to clinic for reduced grip strength of her left hand. Endorses weakness.   Patient was in [name] last week visiting the grave of her parent.  In keeping with her [place] tradition, she was beating a large rock against the back of the grave shrine.  For the next 24 to 48 hours she had weakness and reduced grip strength of her left hand. She was not able to grip her phone. Was not able to open a jar or ziplock bag. This sensation has now resolved.  She is asymptomatic today.  She also complains of intermittent weakness of her legs.  This has been a problem for the past few months.  She has not been paying much attention to when, how long, duration of this problem.  "Sometimes I will just be walking in my leg feels like it gives out" she has not fallen down.  She states she is always able to catch herself when this happens  Denies back pain, saddle paresthesia, loss of bowel or bladder function, numbness, tingling. No weakness of left arm or right hand.  This has never happened to her before.     Review of Systems  Musculoskeletal: Positive for gait problem. Negative for arthralgias, back pain, neck pain and neck stiffness.  Skin: Negative.   Neurological: Positive for weakness. Negative for dizziness and numbness.    Patient Active Problem List   Diagnosis Date Noted  . Pain in thoracic spine 06/22/2012  . Lumbago 06/22/2012    Current Outpatient Medications on File Prior to Visit  Medication Sig Dispense Refill  . levonorgestrel (MIRENA) 20 MCG/24HR IUD 1 each by Intrauterine route once.    . famotidine (PEPCID) 40 MG tablet Take 1 tablet (40 mg total) by mouth daily. (Patient not taking: Reported on 07/16/2017) 30 tablet 3  . levocetirizine (XYZAL) 5 MG tablet Take 1 tablet (5 mg total) by mouth every evening. (Patient not taking: Reported  on 07/16/2017) 30 tablet 3  . metFORMIN (GLUCOPHAGE) 500 MG tablet Take 500 mg by mouth 2 (two) times daily with a meal. Reported on 03/22/2015     No current facility-administered medications on file prior to visit.     Allergies  Allergen Reactions  . Tramadol Hives, Itching and Rash  . Hydrocodone-Acetaminophen Itching  . Flomax [Tamsulosin Hcl] Palpitations    headache     Objective:  BP 132/82 (BP Location: Left Arm, Patient Position: Sitting, Cuff Size: Normal)   Pulse 93   Temp 98.4 F (36.9 C) (Oral)   Resp 16   Ht 5' 0.25" (1.53 m)   Wt 156 lb 3.2 oz (70.9 kg)   SpO2 97%   BMI 30.25 kg/m   Physical Exam  Constitutional: She is oriented to person, place, and time. No distress.  Cardiovascular: Normal rate, regular rhythm and normal heart sounds.  Musculoskeletal:       Cervical back: She exhibits normal range of motion, no tenderness and no bony tenderness.       Lumbar back: She exhibits normal range of motion, no tenderness and no bony tenderness.       Right hand: She exhibits normal range of motion and no tenderness. Normal sensation noted. Normal strength noted.       Left hand: She exhibits normal range of motion and no tenderness. Normal sensation noted. Normal  strength noted.  Neurological: She is alert and oriented to person, place, and time. She has normal strength. No cranial nerve deficit or sensory deficit. She displays a negative Romberg sign.  Skin: Skin is warm and dry.  Psychiatric: Judgment normal.  Vitals reviewed.   Assessment and Plan :  1. Muscle weakness - Patient complains of reduced grip strength of left hand a few days ago.  Suspect this is likely due to recent repetitive trauma from banging a rock against cement.  She is asymptomatic today, no neurological deficits on exam, no muscle weakness upper or lower extremities.  Due to additional symptoms of intermittent leg weakness plan to refer to neurology for evaluation.  She understands and  agrees with plan - Ambulatory referral to Neurology   Mercer Pod, PA-C  [facility] 07/16/2017 12:12 PM

## 2017-08-30 ENCOUNTER — Other Ambulatory Visit: Payer: Self-pay

## 2017-08-30 DIAGNOSIS — D485 Neoplasm of uncertain behavior of skin: Secondary | ICD-10-CM | POA: Diagnosis not present

## 2017-08-30 DIAGNOSIS — E859 Amyloidosis, unspecified: Secondary | ICD-10-CM | POA: Diagnosis not present

## 2017-08-30 DIAGNOSIS — L709 Acne, unspecified: Secondary | ICD-10-CM | POA: Diagnosis not present

## 2017-08-30 DIAGNOSIS — L810 Postinflammatory hyperpigmentation: Secondary | ICD-10-CM | POA: Diagnosis not present

## 2017-09-23 DIAGNOSIS — Z23 Encounter for immunization: Secondary | ICD-10-CM | POA: Diagnosis not present

## 2017-10-15 DIAGNOSIS — L442 Lichen striatus: Secondary | ICD-10-CM | POA: Diagnosis not present

## 2017-10-18 ENCOUNTER — Ambulatory Visit: Payer: 59 | Admitting: Neurology

## 2018-07-20 ENCOUNTER — Ambulatory Visit (INDEPENDENT_AMBULATORY_CARE_PROVIDER_SITE_OTHER): Payer: 59 | Admitting: Advanced Practice Midwife

## 2018-07-20 ENCOUNTER — Other Ambulatory Visit: Payer: Self-pay

## 2018-07-20 ENCOUNTER — Encounter: Payer: Self-pay | Admitting: Advanced Practice Midwife

## 2018-07-20 ENCOUNTER — Encounter: Payer: 59 | Admitting: Advanced Practice Midwife

## 2018-07-20 VITALS — BP 157/98 | HR 82 | Ht 59.0 in | Wt 153.0 lb

## 2018-07-20 DIAGNOSIS — N898 Other specified noninflammatory disorders of vagina: Secondary | ICD-10-CM | POA: Diagnosis not present

## 2018-07-20 DIAGNOSIS — Z01419 Encounter for gynecological examination (general) (routine) without abnormal findings: Secondary | ICD-10-CM | POA: Diagnosis not present

## 2018-07-20 DIAGNOSIS — Z975 Presence of (intrauterine) contraceptive device: Secondary | ICD-10-CM

## 2018-07-20 DIAGNOSIS — Z1151 Encounter for screening for human papillomavirus (HPV): Secondary | ICD-10-CM

## 2018-07-20 DIAGNOSIS — Z124 Encounter for screening for malignant neoplasm of cervix: Secondary | ICD-10-CM

## 2018-07-20 DIAGNOSIS — Z113 Encounter for screening for infections with a predominantly sexual mode of transmission: Secondary | ICD-10-CM

## 2018-07-20 NOTE — Patient Instructions (Signed)

## 2018-07-20 NOTE — Progress Notes (Signed)
GYNECOLOGY ANNUAL PREVENTATIVE CARE ENCOUNTER NOTE  History:     [name] is a 48 y.o. [phone] female here for a routine annual gynecologic exam.  Current complaints: None. Patient is sexually active with 1 female partner. Requests STI screening today. Denies abnormal vaginal bleeding, discharge, pelvic pain, problems with intercourse or other gynecologic concerns.    Gynecologic History No LMP recorded. (Menstrual status: IUD). Contraception: IUD Last Pap: 02/02/2017. Results were: normal with positive HPV Last mammogram: Remote. Results were: normal  Obstetric History OB History  Gravida Para Term Preterm AB Living  4 1 1   3 1   SAB TAB Ectopic Multiple Live Births    3          # Outcome Date GA Lbr Len/2nd Weight Sex Delivery Anes PTL Lv  4 TAB           3 TAB           2 TAB           1 Term             Past Medical History:  Diagnosis Date  . Back pain   . Chronic kidney disease    kidney stones  . Diabetes mellitus without complication ([place])   . History of kidney stones     Past Surgical History:  Procedure Laterality Date  . CHOLECYSTECTOMY, LAPAROSCOPIC    . CYSTOSCOPY WITH URETEROSCOPY AND STENT PLACEMENT Bilateral 07/29/2012   Procedure: CYSTOSCOPY WITH BILATERAL URETEROSCOPY AND BILATERAL STENT PLACEMENT, BASKET EXTRACTION OF BILATERAL URETERAL STONES;  Surgeon: Alexis Frock, MD;  Location: [facility];  Service: Urology;  Laterality: Bilateral;    Current Outpatient Medications on File Prior to Visit  Medication Sig Dispense Refill  . levonorgestrel (MIRENA) 20 MCG/24HR IUD 1 each by Intrauterine route once.    . famotidine (PEPCID) 40 MG tablet Take 1 tablet (40 mg total) by mouth daily. (Patient not taking: Reported on 07/16/2017) 30 tablet 3  . levocetirizine (XYZAL) 5 MG tablet Take 1 tablet (5 mg total) by mouth every evening. (Patient not taking: Reported on 07/16/2017) 30 tablet 3  . metFORMIN (GLUCOPHAGE) 500 MG tablet Take 500 mg by mouth 2  (two) times daily with a meal. Reported on 03/22/2015     No current facility-administered medications on file prior to visit.     Allergies  Allergen Reactions  . Tramadol Hives, Itching and Rash  . Hydrocodone-Acetaminophen Itching  . Flomax [Tamsulosin Hcl] Palpitations    headache    Social History:  reports that she has never smoked. She has never used smokeless tobacco. She reports that she does not drink alcohol or use drugs.  Family History  Problem Relation Age of Onset  . Other Neg Hx     The following portions of the patient's history were reviewed and updated as appropriate: allergies, current medications, past family history, past medical history, past social history, past surgical history and problem list.  Review of Systems Pertinent items noted in HPI and remainder of comprehensive ROS otherwise negative.  Physical Exam:  BP (!) 157/98   Pulse 82   Ht 4\' 11"  (1.499 m)   Wt 153 lb (69.4 kg)   BMI 30.90 kg/m  CONSTITUTIONAL: Well-developed, well-nourished female in no acute distress.  HENT:  Normocephalic, atraumatic, External right and left ear normal. Oropharynx is clear and moist EYES: Conjunctivae and EOM are normal. Pupils are equal, round, and reactive to light. No scleral icterus.  NECK:  Normal range of motion, supple, no masses.  Normal thyroid.  SKIN: Skin is warm and dry. No rash noted. Not diaphoretic. No erythema. No pallor. MUSCULOSKELETAL: Normal range of motion. No tenderness.  No cyanosis, clubbing, or edema.  2+ distal pulses. NEUROLOGIC: Alert and oriented to person, place, and time. Normal reflexes, muscle tone coordination. No cranial nerve deficit noted. PSYCHIATRIC: Normal mood and affect. Normal behavior. Normal judgment and thought content. CARDIOVASCULAR: Normal heart rate noted, regular rhythm RESPIRATORY: Clear to auscultation bilaterally. Effort and breath sounds normal, no problems with respiration noted. BREASTS: Symmetric in size.  No masses, skin changes, nipple drainage, or lymphadenopathy. ABDOMEN: Soft, normal bowel sounds, no distention noted.  No tenderness, rebound or guarding.  PELVIC: Normal appearing external genitalia; normal appearing vaginal mucosa and cervix.  No abnormal discharge noted.  Pap smear obtained.  Normal uterine size, no other palpable masses, no uterine or adnexal tenderness. IUD strings visible, appropriate length   Assessment and Plan:    1. Well woman exam - No concerning findings on exam. Routine care - Discussed recommendations for diet and exercise - Cytology - PAP( [place]) - CBC - TSH - Hemoglobin A1c - Comprehensive metabolic panel - Lipid panel - MM 3D SCREEN BREAST BILATERAL; Future  2. IUD (intrauterine device) in place   3. Screening examination for STD (sexually transmitted disease)  - Hepatitis B surface antigen - Hepatitis C antibody - HIV Antibody (routine testing w rflx) - RPR  4. Vaginal discharge - Cervicovaginal ancillary only( [place])  Will follow up results of pap smear and manage accordingly. Mammogram scheduled Routine preventative health maintenance measures emphasized. Please refer to After Visit Summary for other counseling recommendations.   Total visit time 30 minutes. Greater than 50% of visit spent in counseling and coordination of care.    Mallie Snooks, MSN, [place] Certified Nurse [place] for [employer], [place]

## 2018-07-21 ENCOUNTER — Encounter: Payer: Self-pay | Admitting: Advanced Practice Midwife

## 2018-07-21 DIAGNOSIS — R7303 Prediabetes: Secondary | ICD-10-CM | POA: Insufficient documentation

## 2018-07-21 DIAGNOSIS — E789 Disorder of lipoprotein metabolism, unspecified: Secondary | ICD-10-CM | POA: Insufficient documentation

## 2018-07-21 LAB — RPR: RPR Ser Ql: NONREACTIVE

## 2018-07-21 LAB — COMPREHENSIVE METABOLIC PANEL
ALT: 10 IU/L (ref 0–32)
AST: 10 IU/L (ref 0–40)
Albumin/Globulin Ratio: 1.6 (ref 1.2–2.2)
Albumin: 4.5 g/dL (ref 3.8–4.8)
Alkaline Phosphatase: 68 IU/L (ref 39–117)
BUN/Creatinine Ratio: 11 (ref 9–23)
BUN: 10 mg/dL (ref 6–24)
Bilirubin Total: 0.3 mg/dL (ref 0.0–1.2)
CO2: 21 mmol/L (ref 20–29)
Calcium: 9.2 mg/dL (ref 8.7–10.2)
Chloride: 100 mmol/L (ref 96–106)
Creatinine, Ser: 0.89 mg/dL (ref 0.57–1.00)
GFR calc Af Amer: 89 mL/min/{1.73_m2} (ref >59)
GFR calc non Af Amer: 77 mL/min/{1.73_m2} (ref >59)
Globulin, Total: 2.9 g/dL (ref 1.5–4.5)
Glucose: 202 mg/dL — ABNORMAL HIGH (ref 65–99)
Potassium: 3.9 mmol/L (ref 3.5–5.2)
Sodium: 141 mmol/L (ref 134–144)
Total Protein: 7.4 g/dL (ref 6.0–8.5)

## 2018-07-21 LAB — LIPID PANEL
Chol/HDL Ratio: 4.2 ratio (ref 0.0–4.4)
Cholesterol, Total: 208 mg/dL — ABNORMAL HIGH (ref 100–199)
HDL: 50 mg/dL (ref >39)
LDL Calculated: 129 mg/dL — ABNORMAL HIGH (ref 0–99)
Triglycerides: 143 mg/dL (ref 0–149)
VLDL Cholesterol Cal: 29 mg/dL (ref 5–40)

## 2018-07-21 LAB — HEMOGLOBIN A1C
Est. average glucose Bld gHb Est-mCnc: 128 mg/dL
Hgb A1c MFr Bld: 6.1 % — ABNORMAL HIGH (ref 4.8–5.6)

## 2018-07-21 LAB — HEPATITIS B SURFACE ANTIGEN: Hepatitis B Surface Ag: NEGATIVE

## 2018-07-21 LAB — CBC
Hematocrit: 40.1 % (ref 34.0–46.6)
Hemoglobin: 13.4 g/dL (ref 11.1–15.9)
MCH: 28.7 pg (ref 26.6–33.0)
MCHC: 33.4 g/dL (ref 31.5–35.7)
MCV: 86 fL (ref 79–97)
Platelets: 329 10*3/uL (ref 150–450)
RBC: 4.67 x10E6/uL (ref 3.77–5.28)
RDW: 12.4 % (ref 11.7–15.4)
WBC: 8.4 10*3/uL (ref 3.4–10.8)

## 2018-07-21 LAB — HIV ANTIBODY (ROUTINE TESTING W REFLEX): HIV Screen 4th Generation wRfx: NONREACTIVE

## 2018-07-21 LAB — HEPATITIS C ANTIBODY: Hep C Virus Ab: <0.1 s/co ratio (ref 0.0–0.9)

## 2018-07-21 LAB — CERVICOVAGINAL ANCILLARY ONLY
Bacterial vaginitis: NEGATIVE
Candida vaginitis: NEGATIVE

## 2018-07-21 LAB — TSH: TSH: 1.94 u[IU]/mL (ref 0.450–4.500)

## 2018-07-22 ENCOUNTER — Encounter: Payer: Self-pay | Admitting: Registered Nurse

## 2018-07-22 ENCOUNTER — Other Ambulatory Visit: Payer: Self-pay

## 2018-07-22 ENCOUNTER — Ambulatory Visit: Payer: 59 | Admitting: Registered Nurse

## 2018-07-22 VITALS — BP 124/80 | HR 77 | Temp 98.0°F | Resp 16 | Wt 152.0 lb

## 2018-07-22 DIAGNOSIS — F411 Generalized anxiety disorder: Secondary | ICD-10-CM

## 2018-07-22 DIAGNOSIS — G43009 Migraine without aura, not intractable, without status migrainosus: Secondary | ICD-10-CM

## 2018-07-22 DIAGNOSIS — R7309 Other abnormal glucose: Secondary | ICD-10-CM

## 2018-07-22 LAB — CYTOLOGY - PAP
Diagnosis: NEGATIVE
HPV: NOT DETECTED

## 2018-07-22 LAB — GLUCOSE, POCT (MANUAL RESULT ENTRY): POC Glucose: 125 mg/dl — AB (ref 70–99)

## 2018-07-22 MED ORDER — BUSPIRONE HCL 5 MG PO TABS: 5.0000 mg | ORAL_TABLET | Freq: Three times a day (TID) | ORAL | 0 refills | Status: DC

## 2018-07-22 MED ORDER — SUMATRIPTAN SUCCINATE 25 MG PO TABS: 25.0000 mg | ORAL_TABLET | ORAL | 2 refills | Status: DC | PRN

## 2018-07-22 NOTE — Patient Instructions (Signed)
° ° ° °  If you have lab work done today you will be contacted with your lab results within the next 2 weeks.  If you have not heard from us then please contact us. The fastest way to get your results is to register for My Chart. ° ° °IF you received an x-ray today, you will receive an invoice from [facility]. Please contact [facility] at [phone] with questions or concerns regarding your invoice.  ° °IF you received labwork today, you will receive an invoice from [facility]. Please contact [facility] at [phone] with questions or concerns regarding your invoice.  ° °Our billing staff will not be able to assist you with questions regarding bills from these companies. ° °You will be contacted with the lab results as soon as they are available. The fastest way to get your results is to activate your My Chart account. Instructions are located on the last page of this paperwork. If you have not heard from us regarding the results in 2 weeks, please contact this office. °  ° ° ° °

## 2018-07-22 NOTE — Progress Notes (Signed)
Established Patient Office Visit  Subjective:  Patient ID: [name], female    DOB: [birth year]  Age: 48 y.o. MRN: [id number]  CC:  Chief Complaint  Patient presents with  . Establish Care    pt needs to est care with pcp to manage care  . Elavated Glucose    pt has labs done on the 7/15 at her OBGYN and her A1c was elavated     HPI [name] presents for visit to establish care, headaches, and anxiety  Pt has history of elevated glucose. On metformin 500mg  PO qd. Last A1c yesterday was 6.1%. Encouraged continuing healthy diet.   Also taking Pepcid for GERD, Xyzal for seasonal allergies. Has Mirena as BCM  Headaches are somewhat infrequent, have been more often in previous 3 months. Start and neck then wrap around head. Has had stabbing pain. No visual changes. No chest pain. No shortness of breath. Has had success with sumatriptan in the past.  Anxiety has also been heightened - has felt cooped up at home during [place]. Denies interest in therapy but willing to try PRN  Past Medical History:  Diagnosis Date  . Back pain   . Chronic kidney disease    kidney stones  . Diabetes mellitus without complication ([place])   . History of kidney stones     Past Surgical History:  Procedure Laterality Date  . CHOLECYSTECTOMY, LAPAROSCOPIC    . CYSTOSCOPY WITH URETEROSCOPY AND STENT PLACEMENT Bilateral 07/29/2012   Procedure: CYSTOSCOPY WITH BILATERAL URETEROSCOPY AND BILATERAL STENT PLACEMENT, BASKET EXTRACTION OF BILATERAL URETERAL STONES;  Surgeon: Alexis Frock, MD;  Location: [facility];  Service: Urology;  Laterality: Bilateral;    Family History  Problem Relation Age of Onset  . Other Neg Hx     Social History   Socioeconomic History  . Marital status: Married    Spouse name: Not on file  . Number of children: Not on file  . Years of education: Not on file  . Highest education level: Not on file  Occupational History  . Not on file  Social Needs  . Financial  resource strain: Not on file  . Food insecurity    Worry: Not on file    Inability: Not on file  . Transportation needs    Medical: Not on file    Non-medical: Not on file  Tobacco Use  . Smoking status: Never Smoker  . Smokeless tobacco: Never Used  Substance and Sexual Activity  . Alcohol use: No    Comment: occ  . Drug use: No    Comment: takes Hydrocodone for R Flank pain for past 3 years;  . Sexual activity: Yes    Birth control/protection: I.U.D.  Lifestyle  . Physical activity    Days per week: Not on file    Minutes per session: Not on file  . Stress: Not on file  Relationships  . Social Herbalist on phone: Not on file    Gets together: Not on file    Attends religious service: Not on file    Active member of club or organization: Not on file    Attends meetings of clubs or organizations: Not on file    Relationship status: Not on file  . Intimate partner violence    Fear of current or ex partner: Not on file    Emotionally abused: Not on file    Physically abused: Not on file    Forced sexual activity: Not  on file  Other Topics Concern  . Not on file  Social History Narrative  . Not on file    Outpatient Medications Prior to Visit  Medication Sig Dispense Refill  . famotidine (PEPCID) 40 MG tablet Take 1 tablet (40 mg total) by mouth daily. 30 tablet 3  . levocetirizine (XYZAL) 5 MG tablet Take 1 tablet (5 mg total) by mouth every evening. 30 tablet 3  . levonorgestrel (MIRENA) 20 MCG/24HR IUD 1 each by Intrauterine route once.    . metFORMIN (GLUCOPHAGE) 500 MG tablet Take 500 mg by mouth 2 (two) times daily with a meal. Reported on 03/22/2015     No facility-administered medications prior to visit.     Allergies  Allergen Reactions  . Tramadol Hives, Itching and Rash  . Hydrocodone-Acetaminophen Itching  . Flomax [Tamsulosin Hcl] Palpitations    headache    ROS Review of Systems  Constitutional: Negative.   HENT: Negative.   Eyes:  Negative.   Respiratory: Negative.   Cardiovascular: Negative.   Gastrointestinal: Negative.   Endocrine: Negative.   Genitourinary: Negative.   Musculoskeletal: Negative.   Skin: Negative.   Allergic/Immunologic: Negative.   Neurological: Negative.   Hematological: Negative.   Psychiatric/Behavioral: Negative.       Objective:    Physical Exam  Constitutional: She appears well-developed and well-nourished. No distress.  Cardiovascular: Normal rate and regular rhythm.  Pulmonary/Chest: Effort normal. No respiratory distress.  Neurological: She is alert.  Skin: Skin is warm and dry. No rash noted. She is not diaphoretic. No erythema. No pallor.  Psychiatric: She has a normal mood and affect. Her behavior is normal. Judgment and thought content normal.  Nursing note reviewed.   BP (!) 144/81   Pulse 77   Temp 98 F (36.7 C) (Oral)   Resp 16   Wt 152 lb (68.9 kg)   SpO2 98%   BMI 30.70 kg/m  Wt Readings from Last 3 Encounters:  07/22/18 152 lb (68.9 kg)  07/20/18 153 lb (69.4 kg)  07/16/17 156 lb 3.2 oz (70.9 kg)     Health Maintenance Due  Topic Date Due  . TETANUS/TDAP  02/07/1989    There are no preventive care reminders to display for this patient.  Lab Results  Component Value Date   TSH 1.940 07/20/2018   Lab Results  Component Value Date   WBC 8.4 07/20/2018   HGB 13.4 07/20/2018   HCT 40.1 07/20/2018   MCV 86 07/20/2018   PLT 329 07/20/2018   Lab Results  Component Value Date   NA 141 07/20/2018   K 3.9 07/20/2018   CO2 21 07/20/2018   GLUCOSE 202 (H) 07/20/2018   BUN 10 07/20/2018   CREATININE 0.89 07/20/2018   BILITOT 0.3 07/20/2018   ALKPHOS 68 07/20/2018   AST 10 07/20/2018   ALT 10 07/20/2018   PROT 7.4 07/20/2018   ALBUMIN 4.5 07/20/2018   CALCIUM 9.2 07/20/2018   ANIONGAP 7 03/22/2014   Lab Results  Component Value Date   CHOL 208 (H) 07/20/2018   Lab Results  Component Value Date   HDL 50 07/20/2018   Lab Results   Component Value Date   LDLCALC 129 (H) 07/20/2018   Lab Results  Component Value Date   TRIG 143 07/20/2018   Lab Results  Component Value Date   CHOLHDL 4.2 07/20/2018   Lab Results  Component Value Date   HGBA1C 6.1 (H) 07/20/2018      Assessment & Plan:  Problem List Items Addressed This Visit    None    Visit Diagnoses    Elevated glucose    -  Primary   Relevant Orders   POCT glucose (manual entry) (Completed)   Generalized anxiety disorder       Relevant Medications   busPIRone (BUSPAR) 5 MG tablet   Migraine without aura and without status migrainosus, not intractable       Relevant Medications   SUMAtriptan (IMITREX) 25 MG tablet      Meds ordered this encounter  Medications  . busPIRone (BUSPAR) 5 MG tablet    Sig: Take 1 tablet (5 mg total) by mouth 3 (three) times daily.    Dispense:  90 tablet    Refill:  0    Order Specific Question:   Supervising Provider    Answer:   Delia Chimes A [id number]  . SUMAtriptan (IMITREX) 25 MG tablet    Sig: Take 1 tablet (25 mg total) by mouth every 2 (two) hours as needed for migraine. May repeat in 2 hours if headache persists or recurs.    Dispense:  10 tablet    Refill:  2    Order Specific Question:   Supervising Provider    Answer:   Forrest Moron [id number]    Follow-up: Return in about 3 months (around 10/22/2018).   PLAN  Sumatriptan 25mg  PO for migraine, take one, may take one 2 hours later if headache does not improve. May take 1 per day afterwards for 5 days if needed.  BuSpar 5mg  PO tid PRN for anxiety.  Suggested working on other nonpharm coping mechanisms: exercise, outdoor activity, meditation, yoga.  BP elevated upon arrival, but came down wnl by end of visit  Patient encouraged to call clinic with any questions, comments, or concerns.    Maximiano Coss, NP

## 2018-08-12 ENCOUNTER — Ambulatory Visit: Payer: 59 | Admitting: Obstetrics & Gynecology

## 2018-09-02 ENCOUNTER — Ambulatory Visit: Payer: 59

## 2018-10-28 ENCOUNTER — Ambulatory Visit: Payer: 59 | Admitting: Registered Nurse

## 2018-10-31 ENCOUNTER — Encounter: Payer: Self-pay | Admitting: Registered Nurse

## 2019-04-14 ENCOUNTER — Ambulatory Visit (INDEPENDENT_AMBULATORY_CARE_PROVIDER_SITE_OTHER): Payer: 59

## 2019-04-14 ENCOUNTER — Encounter (HOSPITAL_COMMUNITY): Payer: Self-pay

## 2019-04-14 ENCOUNTER — Ambulatory Visit (HOSPITAL_COMMUNITY)
Admission: EM | Admit: 2019-04-14 | Discharge: 2019-04-14 | Disposition: A | Discharge status: Home / Self Care | Payer: 59 | Attending: Emergency Medicine | Admitting: Emergency Medicine

## 2019-04-14 ENCOUNTER — Other Ambulatory Visit: Payer: Self-pay

## 2019-04-14 DIAGNOSIS — E1165 Type 2 diabetes mellitus with hyperglycemia: Secondary | ICD-10-CM | POA: Insufficient documentation

## 2019-04-14 DIAGNOSIS — R509 Fever, unspecified: Secondary | ICD-10-CM | POA: Diagnosis not present

## 2019-04-14 DIAGNOSIS — Z760 Encounter for issue of repeat prescription: Secondary | ICD-10-CM | POA: Diagnosis not present

## 2019-04-14 DIAGNOSIS — Z20822 Contact with and (suspected) exposure to covid-19: Secondary | ICD-10-CM | POA: Diagnosis present

## 2019-04-14 DIAGNOSIS — N390 Urinary tract infection, site not specified: Secondary | ICD-10-CM | POA: Diagnosis present

## 2019-04-14 DIAGNOSIS — R5383 Other fatigue: Secondary | ICD-10-CM

## 2019-04-14 LAB — POC INFLUENZA A AND B ANTIGEN (URGENT CARE ONLY)
Influenza A Ag: NEGATIVE
Influenza B Ag: NEGATIVE

## 2019-04-14 LAB — POCT URINALYSIS DIP (DEVICE)
Bilirubin Urine: NEGATIVE
Glucose, UA: >=1000 mg/dL — AB
Nitrite: NEGATIVE
Protein, ur: 30 mg/dL — AB
Specific Gravity, Urine: 1.015 (ref 1.005–1.030)
Urobilinogen, UA: 0.2 mg/dL (ref 0.0–1.0)
pH: 7 (ref 5.0–8.0)

## 2019-04-14 LAB — INFLUENZA A AND B ANTIGEN (CONVERTED LAB)
Influenza A Ag: NEGATIVE
Influenza B Ag: NEGATIVE

## 2019-04-14 LAB — CBG MONITORING, ED: Glucose-Capillary: 267 mg/dL — ABNORMAL HIGH (ref 70–99)

## 2019-04-14 LAB — GLUCOSE, CAPILLARY: Glucose-Capillary: 267 mg/dL — ABNORMAL HIGH (ref 70–99)

## 2019-04-14 MED ORDER — FREESTYLE SYSTEM KIT: 1.0000 | PACK | Freq: Every day | 0 refills | Status: DC

## 2019-04-14 MED ORDER — CEPHALEXIN 500 MG PO CAPS: 500.0000 mg | ORAL_CAPSULE | Freq: Four times a day (QID) | ORAL | 0 refills | Status: AC

## 2019-04-14 MED ORDER — PHENAZOPYRIDINE HCL 200 MG PO TABS: 200.0000 mg | ORAL_TABLET | Freq: Three times a day (TID) | ORAL | 0 refills | Status: DC | PRN

## 2019-04-14 MED ORDER — ACETAMINOPHEN 325 MG PO TABS
975.0000 mg | ORAL_TABLET | Freq: Once | ORAL | Status: AC
  Administered 2019-04-14: 15:00:00 975 mg via ORAL

## 2019-04-14 MED ORDER — GLUCOSE BLOOD VI STRP: ORAL_STRIP | 2 refills | Status: DC

## 2019-04-14 MED ORDER — ACETAMINOPHEN 325 MG PO TABS
ORAL_TABLET | ORAL | Status: AC
  Filled 2019-04-14: qty 3

## 2019-04-14 MED ORDER — IBUPROFEN 600 MG PO TABS: 600.0000 mg | ORAL_TABLET | Freq: Four times a day (QID) | ORAL | 0 refills | Status: DC | PRN

## 2019-04-14 MED ORDER — METFORMIN HCL 500 MG PO TABS: 500.0000 mg | ORAL_TABLET | Freq: Two times a day (BID) | ORAL | 0 refills | Status: DC

## 2019-04-14 NOTE — Discharge Instructions (Addendum)
Your Covid PCR will be back in the next 24 hours we will contact you only if it is positive.  Your flu, chest x-ray were all negative.  Your UA shows that you have a urinary tract infection.  I have sent this off for culture to make sure that you are on the correct antibiotic.  Make sure you push plenty of extra fluids.  Take 600 mg of ibuprofen combined with gauze milligrams Tylenol 3-4 times a day.  Go immediately to the ER for fevers above 100.4 that have not responded to Tylenol/ibuprofen/Keflex in 24 to 36 hours, if you get worse, or for other concerns.  I am giving you a glucometer and refilling your Metformin.  Start taking it once a day for a week, while keeping an eye on your sugars, and you can increase it to 1 twice a day after a week.  You need to follow-up with your doctor in 3 days to make sure that you are getting better.

## 2019-04-14 NOTE — ED Triage Notes (Signed)
Pt states on Monday had sleep paralysis then Tuesday had chills, fever. Today body aches, chills.

## 2019-04-14 NOTE — ED Provider Notes (Signed)
HPI  SUBJECTIVE:  [name] is a 49 y.o. female who presents with 4 days of fevers T-max 100.8, body aches, headaches, dizziness.  Reports suprapubic pressure with urination only.  No other abdominal pain.  She reports decreased p.o. intake but is tolerating p.o.  No nasal congestion loss of sense of smell or taste sore throat cough shortness of breath nausea vomiting diarrhea.  No photophobia neck stiffness rash.  No urinary urgency frequency cloudy odorous urine or hematuria.  She got her first Covid vaccination 10 days ago.  No flu shot this year.  She tried Advil 400 mg twice today without improvement in her symptoms with no aggravating factors.  Last dose of ibuprofen was within 4 to 6 hours of evaluation.  Patient has a past medical history of diabetes.  States that she has not taken her Metformin in over a month.  States she cannot remember when she checked her glucose last.  Does not have a glucometer at home.  She also has a past medical history of obstructing nephrolithiasis.  No history of UTI, pyelonephritis, DKA, hypertension, asthma, eczema, COPD, smoking, coronary artery disease, cancer, HIV, immunocompromise.  LMP: Has an IUD.  Denies the possibility of being pregnant.  YSA:[id number], Delfino Lovett, NP     Past Medical History:  Diagnosis Date  . Back pain   . Chronic kidney disease    kidney stones  . Diabetes mellitus without complication ([place])   . History of kidney stones     Past Surgical History:  Procedure Laterality Date  . CHOLECYSTECTOMY, LAPAROSCOPIC    . CYSTOSCOPY WITH URETEROSCOPY AND STENT PLACEMENT Bilateral 07/29/2012   Procedure: CYSTOSCOPY WITH BILATERAL URETEROSCOPY AND BILATERAL STENT PLACEMENT, BASKET EXTRACTION OF BILATERAL URETERAL STONES;  Surgeon: Alexis Frock, MD;  Location: [facility];  Service: Urology;  Laterality: Bilateral;    Family History  Problem Relation Age of Onset  . Other Neg Hx     Social History   Tobacco Use  . Smoking status:  Never Smoker  . Smokeless tobacco: Never Used  Substance Use Topics  . Alcohol use: No    Comment: occ  . Drug use: No    Comment: takes Hydrocodone for R Flank pain for past 3 years;    No current facility-administered medications for this encounter.  Current Outpatient Medications:  .  cephALEXin (KEFLEX) 500 MG capsule, Take 1 capsule (500 mg total) by mouth 4 (four) times daily for 10 days., Disp: 40 capsule, Rfl: 0 .  glucose blood test strip, Check your sugar in the morning before you eat breakfast, and one hour after a meal., Disp: 100 each, Rfl: 2 .  glucose monitoring kit (FREESTYLE) monitoring kit, 1 each by Does not apply route daily. Check glucose once in the morning before breakfast and 1 hour after a meal, Disp: 1 each, Rfl: 0 .  ibuprofen (ADVIL) 600 MG tablet, Take 1 tablet (600 mg total) by mouth every 6 (six) hours as needed., Disp: 30 tablet, Rfl: 0 .  levonorgestrel (MIRENA) 20 MCG/24HR IUD, 1 each by Intrauterine route once., Disp: , Rfl:  .  metFORMIN (GLUCOPHAGE) 500 MG tablet, Take 1 tablet (500 mg total) by mouth 2 (two) times daily with a meal. 1 tab po in the a.m. for 1 week, 1 tab twice a day, Disp: 60 tablet, Rfl: 0 .  phenazopyridine (PYRIDIUM) 200 MG tablet, Take 1 tablet (200 mg total) by mouth 3 (three) times daily as needed for pain., Disp: 6 tablet, Rfl:  0  Allergies  Allergen Reactions  . Tramadol Hives, Itching and Rash  . Hydrocodone-Acetaminophen Itching  . Flomax [Tamsulosin Hcl] Palpitations    headache     ROS  As noted in HPI.   Physical Exam  BP 126/73   Pulse (!) 110   Temp 98.7 F (37.1 C) (Oral)   Resp 16   Ht _0  (1.499 m)   Wt 68 kg   SpO2 96%   BMI 30.30 kg/m   Constitutional: Well developed, well nourished, no acute distress Eyes: PERRL, EOMI, conjunctiva normal bilaterally HENT: Normocephalic, atraumatic,mucus membranes moist.  No nasal congestion.  No maxillary or frontal sinus tenderness. Respiratory: Clear to  auscultation bilaterally, no rales, no wheezing, no rhonchi Cardiovascular: Regular tachycardia no murmurs, no gallops, no rubs GI: Soft, nondistended, normal bowel sounds, nontender, no rebound, no guarding.  No suprapubic, flank tenderness.  Negative Murphy. Back: no CVAT skin: No rash, skin intact Musculoskeletal: No edema, no tenderness, no deformities Neurologic: Alert & oriented x 3, CN III-XII grossly intact, no motor deficits, sensation grossly intact Psychiatric: Speech and behavior appropriate   ED Course   Medications  acetaminophen (TYLENOL) tablet 975 mg (975 mg Oral Given 04/14/19 1522)    Orders Placed This Encounter  Procedures  . SARS CORONAVIRUS 2 (TAT 6-24 HRS) Nasopharyngeal Nasopharyngeal Swab    Standing Status:   Standing    Number of Occurrences:   1    Order Specific Question:   Is this test for diagnosis or screening    Answer:   Screening    Order Specific Question:   Symptomatic for COVID-19 as defined by CDC    Answer:   No    Order Specific Question:   Hospitalized for COVID-19    Answer:   No    Order Specific Question:   Admitted to ICU for COVID-19    Answer:   No    Order Specific Question:   Previously tested for COVID-19    Answer:   No    Order Specific Question:   Resident in a congregate (group) care setting    Answer:   No    Order Specific Question:   Employed in healthcare setting    Answer:   No    Order Specific Question:   Pregnant    Answer:   No  . Urine culture    Standing Status:   Standing    Number of Occurrences:   1    Order Specific Question:   List patient's active antibiotics    Answer:   keflex  . DG Chest 2 View    Standing Status:   Standing    Number of Occurrences:   1    Order Specific Question:   Reason for Exam (SYMPTOM  OR DIAGNOSIS REQUIRED)    Answer:   fever unknown origin  . Glucose, capillary    Standing Status:   Standing    Number of Occurrences:   1  . Influenza A and B antigen    Standing  Status:   Standing    Number of Occurrences:   1  . POC CBG monitoring    Standing Status:   Standing    Number of Occurrences:   1  . POCT urinalysis dip (device)    Standing Status:   Standing    Number of Occurrences:   1   Results for orders placed or performed during the hospital encounter of 04/14/19 (from the past  24 hour(s))  SARS CORONAVIRUS 2 (TAT 6-24 HRS) Nasopharyngeal Nasopharyngeal Swab     Status: None   Collection Time: 04/14/19  2:36 PM   Specimen: Nasopharyngeal Swab  Result Value Ref Range   SARS Coronavirus 2 NEGATIVE NEGATIVE  POC CBG monitoring     Status: Abnormal   Collection Time: 04/14/19  3:24 PM  Result Value Ref Range   Glucose-Capillary 267 (H) 70 - 99 mg/dL  Glucose, capillary     Status: Abnormal   Collection Time: 04/14/19  3:24 PM  Result Value Ref Range   Glucose-Capillary 267 (H) 70 - 99 mg/dL  POCT urinalysis dip (device)     Status: Abnormal   Collection Time: 04/14/19  3:26 PM  Result Value Ref Range   Glucose, UA >=1000 (A) NEGATIVE mg/dL   Bilirubin Urine NEGATIVE NEGATIVE   Ketones, ur TRACE (A) NEGATIVE mg/dL   Specific Gravity, Urine 1.015 1.005 - 1.030   Hgb urine dipstick MODERATE (A) NEGATIVE   pH 7.0 5.0 - 8.0   Protein, ur 30 (A) NEGATIVE mg/dL   Urobilinogen, UA 0.2 0.0 - 1.0 mg/dL   Nitrite NEGATIVE NEGATIVE   Leukocytes,Ua LARGE (A) NEGATIVE  Influenza A and B antigen     Status: None   Collection Time: 04/14/19  3:29 PM  Result Value Ref Range   Influenza A Ag NEGATIVE NEGATIVE   Influenza B Ag NEGATIVE NEGATIVE   DG Chest 2 View  Result Date: 04/14/2019 CLINICAL DATA:  Fatigue for 2 days. EXAM: CHEST - 2 VIEW COMPARISON:  PA and lateral chest 05/13/2017. FINDINGS: The lungs are clear. Heart size is normal. There is no pneumothorax or pleural effusion. No acute or focal bony abnormality. IMPRESSION: Negative chest. Electronically Signed   By: Inge Rise M.D.   On: 04/14/2019 15:48    ED Clinical Impression  1.  Complicated UTI (urinary tract infection)   2. Encounter for laboratory testing for COVID-19 virus   3. Uncontrolled type 2 diabetes mellitus with hyperglycemia ([place])   4. Medication refill      ED Assessment/Plan  Last BUN/Cr 7/20 normal  Patient with fever of unknown origin.  Does not appear to be meningitis, sinusitis, pharyngitis intra-abdominal process.  Covid PCR sent.  Rapid flu UA chest x-ray fingerstick.  Giving 975 mg of Tylenol.  Patient took 400 mg of ibuprofen several hours prior to arrival.  Will reevaluate.  Reviewed imaging independently.  Normal chest x-ray. See radiology report for full details.  UA consistent with UTI.  Sending urine off for culture to confirm antibiotic choice.  Her glucose is 267, not within the range of DKA.  Rapid flu negative.  Covid PCR pending.  Her x-ray is normal.  Will treat as a complicated UTI because of the systemic symptoms with Keflex 4 times daily for 10 days, Pyridium, Tylenol/ibuprofen.  She has no CVA tenderness.  Also a prescription for 600 mg of ibuprofen advised her to take it with 1000 mg of Tylenol 3-4 times a day as needed.  She also states that she does not have a glucometer at home to monitor her sugar with so will refill this as well.  We will refill her Metformin.  Advised her to start 500 mg once a day monitor sugar.  May increase back up to her previous dosing to 500 mg twice daily if tolerated.  Follow-up with PMD in 2 to 3 days especially if not getting better, giving her strict ER return precautions.  Work note  for patient and husband.  Discussed labs, imaging, MDM, treatment plan, and plan for follow-up with patient Discussed sn/sx that should prompt return to the ED. patient agrees with plan.   Meds ordered this encounter  Medications  . acetaminophen (TYLENOL) tablet 975 mg  . cephALEXin (KEFLEX) 500 MG capsule    Sig: Take 1 capsule (500 mg total) by mouth 4 (four) times daily for 10 days.    Dispense:  40 capsule     Refill:  0  . phenazopyridine (PYRIDIUM) 200 MG tablet    Sig: Take 1 tablet (200 mg total) by mouth 3 (three) times daily as needed for pain.    Dispense:  6 tablet    Refill:  0  . glucose monitoring kit (FREESTYLE) monitoring kit    Sig: 1 each by Does not apply route daily. Check glucose once in the morning before breakfast and 1 hour after a meal    Dispense:  1 each    Refill:  0  . glucose blood test strip    Sig: Check your sugar in the morning before you eat breakfast, and one hour after a meal.    Dispense:  100 each    Refill:  2  . metFORMIN (GLUCOPHAGE) 500 MG tablet    Sig: Take 1 tablet (500 mg total) by mouth 2 (two) times daily with a meal. 1 tab po in the a.m. for 1 week, 1 tab twice a day    Dispense:  60 tablet    Refill:  0  . ibuprofen (ADVIL) 600 MG tablet    Sig: Take 1 tablet (600 mg total) by mouth every 6 (six) hours as needed.    Dispense:  30 tablet    Refill:  0    *This clinic note was created using [employer]. Therefore, there may be occasional mistakes despite careful proofreading.  ?    Melynda Ripple, MD 04/15/19 [phone]

## 2019-04-15 LAB — SARS CORONAVIRUS 2 (TAT 6-24 HRS): SARS Coronavirus 2: NEGATIVE

## 2019-04-16 LAB — URINE CULTURE

## 2019-05-26 ENCOUNTER — Encounter (HOSPITAL_COMMUNITY): Payer: Self-pay

## 2019-05-26 ENCOUNTER — Ambulatory Visit (INDEPENDENT_AMBULATORY_CARE_PROVIDER_SITE_OTHER): Payer: 59 | Admitting: Registered Nurse

## 2019-05-26 ENCOUNTER — Encounter: Payer: Self-pay | Admitting: Registered Nurse

## 2019-05-26 ENCOUNTER — Other Ambulatory Visit: Payer: Self-pay

## 2019-05-26 VITALS — BP 126/82 | HR 87 | Temp 98.2°F | Ht 59.0 in | Wt 158.0 lb

## 2019-05-26 DIAGNOSIS — Z8639 Personal history of other endocrine, nutritional and metabolic disease: Secondary | ICD-10-CM

## 2019-05-26 DIAGNOSIS — R202 Paresthesia of skin: Secondary | ICD-10-CM | POA: Diagnosis not present

## 2019-05-26 DIAGNOSIS — R21 Rash and other nonspecific skin eruption: Secondary | ICD-10-CM

## 2019-05-26 DIAGNOSIS — R109 Unspecified abdominal pain: Secondary | ICD-10-CM

## 2019-05-26 DIAGNOSIS — R252 Cramp and spasm: Secondary | ICD-10-CM

## 2019-05-26 LAB — POCT GLYCOSYLATED HEMOGLOBIN (HGB A1C): Hemoglobin A1C: 6.6 % — AB (ref 4.0–5.6)

## 2019-05-26 MED ORDER — IBUPROFEN 800 MG PO TABS: 800.0000 mg | ORAL_TABLET | Freq: Three times a day (TID) | ORAL | 0 refills | Status: DC | PRN

## 2019-05-26 MED ORDER — METFORMIN HCL 500 MG PO TABS: 500.0000 mg | ORAL_TABLET | Freq: Every day | ORAL | 1 refills | Status: DC

## 2019-05-26 MED ORDER — BETAMETHASONE VALERATE 0.1 % EX OINT: 1.0000 "application " | TOPICAL_OINTMENT | Freq: Two times a day (BID) | CUTANEOUS | 0 refills | Status: DC

## 2019-05-26 NOTE — Progress Notes (Signed)
Established Patient Office Visit  Subjective:  Patient ID: [name], female    DOB: 01-Apr-1970  Age: 49 y.o. MRN: [id number]  CC:  Chief Complaint  Patient presents with  . R hand pain and numbness    tingling and numbness/ first noticed 1 month/ problems holding items   . hx of kidney stone    intermittent pain / request IBU 800 mg     HPI [name] presents for r hand pain and numbness. Onset around 1 month ago. Worsening. Starts at wrist and radiates to tips of fingers. Makes it difficult to grip anything. Definite loss of strength and sensation in R hand. This has made work in [employer] difficult. 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 Had a release surgery with good effect in the past, hoping to get this done again. Requesting analgesia and referral.  Otherwise notes rash on anterior of both calves. Was seen by derm provider and told that there was nothing to be done, but now it is itchy and irritating. Nothing seems to improve or worsen the rash. It has been chronic for some time. No OTCs give relief.  Lastly - needs refill on metformin 562m PO qd ac. Tolerates well. 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  Pt feels well overall and is without further complaint.   History reviewed. No pertinent past medical history.  History reviewed. No pertinent surgical history.  History reviewed. No pertinent family history.  Social History   Socioeconomic History  . Marital status: Married    Spouse name: Not on file  . Number of children: Not on file  . Years of education: Not on file  . Highest education level: Not on file  Occupational History  . Not on file  Tobacco Use  . Smoking status: Never Smoker  . Smokeless tobacco: Never Used  Substance and Sexual Activity  . Alcohol use: Not on file  . Drug use: Not on file  . Sexual activity: Not on file  Other Topics Concern  . Not on file  Social History  Narrative  . Not on file   Social Determinants of Health   Financial Resource Strain:   . Difficulty of Paying Living Expenses:   Food Insecurity:   . Worried About RCharity fundraiserin the Last Year:   . RArboriculturistin the Last Year:   Transportation Needs:   . LFilm/video editor(Medical):   .Marland KitchenLack of Transportation (Non-Medical):   Physical Activity:   . Days of Exercise per Week:   . Minutes of Exercise per Session:   Stress:   . Feeling of Stress :   Social Connections:   . Frequency of Communication with Friends and Family:   . Frequency of Social Gatherings with Friends and Family:   . Attends Religious Services:   . Active Member of Clubs or Organizations:   . Attends CArchivistMeetings:   .Marland KitchenMarital Status:   Intimate Partner Violence:   . Fear of Current or Ex-Partner:   . Emotionally Abused:   .Marland KitchenPhysically Abused:   . Sexually Abused:     Outpatient Medications Prior to Visit  Medication Sig Dispense Refill  . metFORMIN (GLUCOPHAGE) 500 MG tablet TAKE 1 TABLET BY MOUTH IN THE MORNING FOR 1 WEEK THEN TAKE 1 TAB TWICE A DAY WITH A MEAL     No facility-administered medications prior to  visit.    Not on File  ROS Review of Systems  Constitutional: Negative.   HENT: Negative.   Eyes: Negative.   Respiratory: Negative.   Cardiovascular: Negative.   Gastrointestinal: Negative.   Endocrine: Negative.   Genitourinary: Negative.   Musculoskeletal: Negative.   Skin: Negative.   Allergic/Immunologic: Negative.   Neurological: Positive for weakness and numbness. Negative for dizziness, tremors, seizures, syncope, facial asymmetry, speech difficulty, light-headedness and headaches.  Hematological: Negative.   Psychiatric/Behavioral: Negative.   All other systems reviewed and are negative.     Objective:    Physical Exam  Constitutional: She is oriented to person, place, and time. She appears well-developed and well-nourished. No  distress.  Cardiovascular: Normal rate and regular rhythm.  Pulmonary/Chest: Effort normal. No respiratory distress.  Musculoskeletal:         General: No tenderness, deformity or edema. Normal range of motion.  Neurological: She is alert and oriented to person, place, and time. A sensory deficit is present.  Limited strength and sensation in R hand when flexed or extended.  Pulses and cap refill intact in RUE.  Skin: Skin is warm and dry. No rash noted. She is not diaphoretic. No erythema. No pallor.  Psychiatric: She has a normal mood and affect. Her behavior is normal. Judgment and thought content normal.  Nursing note and vitals reviewed.   BP 126/82   Pulse 87   Temp 98.2 F (36.8 C)   Ht _0  (1.499 m)   Wt 158 lb (71.7 kg)   SpO2 97%   BMI 31.91 kg/m  Wt Readings from Last 3 Encounters:  05/26/19 158 lb (71.7 kg)     Health Maintenance Due  Topic Date Due  . HIV Screening  Never done  . TETANUS/TDAP  Never done    There are no preventive care reminders to display for this patient.  No results found for: TSH No results found for: WBC, HGB, HCT, MCV, PLT No results found for: NA, K, CHLORIDE, CO2, GLUCOSE, BUN, CREATININE, BILITOT, ALKPHOS, AST, ALT, PROT, ALBUMIN, CALCIUM, ANIONGAP, EGFR, GFR No results found for: CHOL No results found for: HDL No results found for: LDLCALC No results found for: TRIG No results found for: CHOLHDL No results found for: HGBA1C    Assessment & Plan:   Problem List Items Addressed This Visit    None    Visit Diagnoses    Flank pain    -  Primary   Relevant Medications   ibuprofen (ADVIL) 800 MG tablet   Cramping of hands       Relevant Orders   Vitamin B12   Vitamin D, 58-IFOYDXA   Basic Metabolic Panel   Magnesium   Ambulatory referral to Hand Surgery   Paresthesia       Relevant Orders   Vitamin B12   Vitamin D, 12-INOMVEH   Basic Metabolic Panel   Magnesium   Ambulatory referral to Hand Surgery   History of  diabetes mellitus       Relevant Orders   POCT glycosylated hemoglobin (Hb A1C)   Rash and nonspecific skin eruption       Relevant Medications   betamethasone valerate ointment (VALISONE) 0.1 %   Other Relevant Orders   Ambulatory referral to Dermatology      Meds ordered this encounter  Medications  . ibuprofen (ADVIL) 800 MG tablet    Sig: Take 1 tablet (800 mg total) by mouth every 8 (eight) hours as needed.  Dispense:  30 tablet    Refill:  0    Order Specific Question:   Supervising Provider    Answer:   Delia Chimes A [id number]  . betamethasone valerate ointment (VALISONE) 0.1 %    Sig: Apply 1 application topically 2 (two) times daily.    Dispense:  30 g    Refill:  0    Order Specific Question:   Supervising Provider    Answer:   Forrest Moron [id number]    Follow-up: No follow-ups on file.   PLAN  Will draw vit d, b12, magnesium, bmp to check on other cause for numbness and cramping.  Will check poct a1c - refill metformin 528m PO qd  Ibuprofen 8015mPO tid PRN  Betamethasone 0.1% for rash on legs  Refer to derm and hand surgery  Return precautions given  Patient encouraged to call clinic with any questions, comments, or concerns.  RiMaximiano CossNP

## 2019-05-26 NOTE — Patient Instructions (Signed)
° ° ° °  If you have lab work done today you will be contacted with your lab results within the next 2 weeks.  If you have not heard from us then please contact us. The fastest way to get your results is to register for My Chart. ° ° °IF you received an x-ray today, you will receive an invoice from [facility]. Please contact [facility] at [phone] with questions or concerns regarding your invoice.  ° °IF you received labwork today, you will receive an invoice from [facility]. Please contact [facility] at [phone] with questions or concerns regarding your invoice.  ° °Our billing staff will not be able to assist you with questions regarding bills from these companies. ° °You will be contacted with the lab results as soon as they are available. The fastest way to get your results is to activate your My Chart account. Instructions are located on the last page of this paperwork. If you have not heard from us regarding the results in 2 weeks, please contact this office. °  ° ° ° °

## 2019-05-27 LAB — BASIC METABOLIC PANEL
BUN/Creatinine Ratio: 22 (ref 9–23)
BUN: 19 mg/dL (ref 6–24)
CO2: 21 mmol/L (ref 20–29)
Calcium: 9.5 mg/dL (ref 8.7–10.2)
Chloride: 105 mmol/L (ref 96–106)
Creatinine, Ser: 0.88 mg/dL (ref 0.57–1.00)
GFR calc Af Amer: 89 mL/min/{1.73_m2} (ref >59)
GFR calc non Af Amer: 77 mL/min/{1.73_m2} (ref >59)
Glucose: 196 mg/dL — ABNORMAL HIGH (ref 65–99)
Potassium: 4.3 mmol/L (ref 3.5–5.2)
Sodium: 145 mmol/L — ABNORMAL HIGH (ref 134–144)

## 2019-05-27 LAB — VITAMIN B12: Vitamin B-12: 587 pg/mL (ref 232–1245)

## 2019-05-27 LAB — VITAMIN D 25 HYDROXY (VIT D DEFICIENCY, FRACTURES): Vit D, 25-Hydroxy: 22.7 ng/mL — ABNORMAL LOW (ref 30.0–100.0)

## 2019-05-27 LAB — MAGNESIUM: Magnesium: 2 mg/dL (ref 1.6–2.3)

## 2019-06-02 ENCOUNTER — Ambulatory Visit (INDEPENDENT_AMBULATORY_CARE_PROVIDER_SITE_OTHER): Payer: 59 | Admitting: Registered Nurse

## 2019-06-02 ENCOUNTER — Other Ambulatory Visit: Payer: Self-pay

## 2019-06-02 ENCOUNTER — Encounter: Payer: Self-pay | Admitting: Registered Nurse

## 2019-06-02 VITALS — BP 139/88 | HR 89 | Temp 98.3°F | Resp 17 | Ht 59.0 in | Wt 159.4 lb

## 2019-06-02 DIAGNOSIS — R3989 Other symptoms and signs involving the genitourinary system: Secondary | ICD-10-CM | POA: Diagnosis not present

## 2019-06-02 DIAGNOSIS — R7309 Other abnormal glucose: Secondary | ICD-10-CM

## 2019-06-02 DIAGNOSIS — Z23 Encounter for immunization: Secondary | ICD-10-CM

## 2019-06-02 DIAGNOSIS — R82998 Other abnormal findings in urine: Secondary | ICD-10-CM

## 2019-06-02 LAB — POCT URINALYSIS DIP (MANUAL ENTRY)
Bilirubin, UA: NEGATIVE
Glucose, UA: NEGATIVE mg/dL
Ketones, POC UA: NEGATIVE mg/dL
Nitrite, UA: NEGATIVE
Protein Ur, POC: NEGATIVE mg/dL
Spec Grav, UA: 1.025 (ref 1.010–1.025)
Urobilinogen, UA: 0.2 E.U./dL
pH, UA: 7 (ref 5.0–8.0)

## 2019-06-02 MED ORDER — NITROFURANTOIN MONOHYD MACRO 100 MG PO CAPS: 100.0000 mg | ORAL_CAPSULE | Freq: Two times a day (BID) | ORAL | 0 refills | Status: AC

## 2019-06-02 NOTE — Patient Instructions (Signed)
° ° ° °  If you have lab work done today you will be contacted with your lab results within the next 2 weeks.  If you have not heard from us then please contact us. The fastest way to get your results is to register for My Chart. ° ° °IF you received an x-ray today, you will receive an invoice from [facility]. Please contact [facility] at [phone] with questions or concerns regarding your invoice.  ° °IF you received labwork today, you will receive an invoice from [facility]. Please contact [facility] at [phone] with questions or concerns regarding your invoice.  ° °Our billing staff will not be able to assist you with questions regarding bills from these companies. ° °You will be contacted with the lab results as soon as they are available. The fastest way to get your results is to activate your My Chart account. Instructions are located on the last page of this paperwork. If you have not heard from us regarding the results in 2 weeks, please contact this office. °  ° ° ° °

## 2019-06-03 LAB — URINE CULTURE

## 2019-06-05 ENCOUNTER — Encounter: Payer: Self-pay | Admitting: Registered Nurse

## 2019-06-05 NOTE — Progress Notes (Signed)
Acute Office Visit  Subjective:    Patient ID: [name], female    DOB: [birth year], 49 y.o.   MRN: [id number]  Chief Complaint  Patient presents with  . Urinary Tract Infection    patient states she has been having some frequent urination and when she goes to the restroom its not much. Also patient is getting cold in a warm room wants to know if it she is going through a phase.    HPI Patient is in today for frequent urination  Some discomfort when voiding. Feeling some chills. Denies flank pain, nvd, fevers.  Had recent UTI about one month ago. Was seen at urgent care and culture collected. However, multiple species present on culture. Was given keflex and symptoms improved, she is not sure if they have entirely resolved.  Otherwise feeling well   Past Medical History:  Diagnosis Date  . Back pain   . Chronic kidney disease    kidney stones  . Diabetes mellitus without complication ([place])   . History of kidney stones     Past Surgical History:  Procedure Laterality Date  . CHOLECYSTECTOMY, LAPAROSCOPIC    . CYSTOSCOPY WITH URETEROSCOPY AND STENT PLACEMENT Bilateral 07/29/2012   Procedure: CYSTOSCOPY WITH BILATERAL URETEROSCOPY AND BILATERAL STENT PLACEMENT, BASKET EXTRACTION OF BILATERAL URETERAL STONES;  Surgeon: Alexis Frock, MD;  Location: [facility];  Service: Urology;  Laterality: Bilateral;    Family History  Problem Relation Age of Onset  . Other Neg Hx     Social History   Socioeconomic History  . Marital status: Married    Spouse name: Not on file  . Number of children: Not on file  . Years of education: Not on file  . Highest education level: Not on file  Occupational History  . Not on file  Tobacco Use  . Smoking status: Never Smoker  . Smokeless tobacco: Never Used  Substance and Sexual Activity  . Alcohol use: No    Comment: occ  . Drug use: No    Comment: takes Hydrocodone for R Flank pain for past 3 years;  . Sexual activity: Yes    Birth  control/protection: I.U.D.  Other Topics Concern  . Not on file  Social History Narrative   ** Merged History Encounter **       Social Determinants of Health   Financial Resource Strain:   . Difficulty of Paying Living Expenses:   Food Insecurity:   . Worried About Charity fundraiser in the Last Year:   . Arboriculturist in the Last Year:   Transportation Needs:   . Film/video editor (Medical):   Marland Kitchen Lack of Transportation (Non-Medical):   Physical Activity:   . Days of Exercise per Week:   . Minutes of Exercise per Session:   Stress:   . Feeling of Stress :   Social Connections:   . Frequency of Communication with Friends and Family:   . Frequency of Social Gatherings with Friends and Family:   . Attends Religious Services:   . Active Member of Clubs or Organizations:   . Attends Archivist Meetings:   Marland Kitchen Marital Status:   Intimate Partner Violence:   . Fear of Current or Ex-Partner:   . Emotionally Abused:   Marland Kitchen Physically Abused:   . Sexually Abused:     Outpatient Medications Prior to Visit  Medication Sig Dispense Refill  . betamethasone valerate ointment (VALISONE) 0.1 % Apply 1 application topically 2 (  two) times daily. 30 g 0  . glucose blood test strip Check your sugar in the morning before you eat breakfast, and one hour after a meal. 100 each 2  . glucose monitoring kit (FREESTYLE) monitoring kit 1 each by Does not apply route daily. Check glucose once in the morning before breakfast and 1 hour after a meal 1 each 0  . ibuprofen (ADVIL) 600 MG tablet Take 1 tablet (600 mg total) by mouth every 6 (six) hours as needed. 30 tablet 0  . ibuprofen (ADVIL) 800 MG tablet Take 1 tablet (800 mg total) by mouth every 8 (eight) hours as needed. 30 tablet 0  . levonorgestrel (MIRENA) 20 MCG/24HR IUD 1 each by Intrauterine route once.    . metFORMIN (GLUCOPHAGE) 500 MG tablet Take 1 tablet (500 mg total) by mouth daily with breakfast. 180 tablet 1  .  phenazopyridine (PYRIDIUM) 200 MG tablet Take 1 tablet (200 mg total) by mouth 3 (three) times daily as needed for pain. 6 tablet 0  . metFORMIN (GLUCOPHAGE) 500 MG tablet Take 1 tablet (500 mg total) by mouth 2 (two) times daily with a meal. 1 tab po in the a.m. for 1 week, 1 tab twice a day 60 tablet 0   No facility-administered medications prior to visit.    Allergies  Allergen Reactions  . Tramadol Hives, Itching and Rash  . Hydrocodone-Acetaminophen Itching  . Flomax [Tamsulosin Hcl] Palpitations    headache    Review of Systems  Constitutional: Positive for chills. Negative for activity change, appetite change, diaphoresis, fatigue, fever and unexpected weight change.  HENT: Negative.   Eyes: Negative.   Respiratory: Negative.   Cardiovascular: Negative.   Gastrointestinal: Negative.   Endocrine: Negative.   Genitourinary: Positive for dysuria and frequency. Negative for decreased urine volume, difficulty urinating, dyspareunia, enuresis, flank pain, genital sores, hematuria, menstrual problem, pelvic pain, urgency, vaginal bleeding, vaginal discharge and vaginal pain.  Musculoskeletal: Negative.   Skin: Negative.   Allergic/Immunologic: Negative.   Neurological: Negative.   Hematological: Negative.   Psychiatric/Behavioral: Negative.   All other systems reviewed and are negative.      Objective:    Physical Exam Vitals and nursing note reviewed.  Constitutional:      General: She is not in acute distress.    Appearance: Normal appearance. She is normal weight. She is not ill-appearing, toxic-appearing or diaphoretic.  Cardiovascular:     Rate and Rhythm: Normal rate and regular rhythm.  Pulmonary:     Effort: Pulmonary effort is normal. No respiratory distress.     Breath sounds: Normal breath sounds.  Skin:    Capillary Refill: Capillary refill takes less than 2 seconds.  Neurological:     General: No focal deficit present.     Mental Status: She is alert and  oriented to person, place, and time. Mental status is at baseline.     Cranial Nerves: No cranial nerve deficit.  Psychiatric:        Mood and Affect: Mood normal.        Behavior: Behavior normal.        Thought Content: Thought content normal.        Judgment: Judgment normal.     BP 139/88   Pulse 89   Temp 98.3 F (36.8 C) (Temporal)   Resp 17   Ht '4\' 11"'  (1.499 m)   Wt 159 lb 6.4 oz (72.3 kg)   SpO2 96%   BMI 32.19 kg/m  Wt Readings  from Last 3 Encounters:  06/02/19 159 lb 6.4 oz (72.3 kg)  05/26/19 158 lb (71.7 kg)  04/14/19 150 lb (68 kg)    There are no preventive care reminders to display for this patient.  There are no preventive care reminders to display for this patient.   Lab Results  Component Value Date   TSH 1.940 07/20/2018   Lab Results  Component Value Date   WBC 8.4 07/20/2018   HGB 13.4 07/20/2018   HCT 40.1 07/20/2018   MCV 86 07/20/2018   PLT 329 07/20/2018   Lab Results  Component Value Date   NA 145 (H) 05/26/2019   K 4.3 05/26/2019   CO2 21 05/26/2019   GLUCOSE 196 (H) 05/26/2019   BUN 19 05/26/2019   CREATININE 0.88 05/26/2019   BILITOT 0.3 07/20/2018   ALKPHOS 68 07/20/2018   AST 10 07/20/2018   ALT 10 07/20/2018   PROT 7.4 07/20/2018   ALBUMIN 4.5 07/20/2018   CALCIUM 9.5 05/26/2019   ANIONGAP 7 03/22/2014   Lab Results  Component Value Date   CHOL 208 (H) 07/20/2018   Lab Results  Component Value Date   HDL 50 07/20/2018   Lab Results  Component Value Date   LDLCALC 129 (H) 07/20/2018   Lab Results  Component Value Date   TRIG 143 07/20/2018   Lab Results  Component Value Date   CHOLHDL 4.2 07/20/2018   Lab Results  Component Value Date   HGBA1C 6.6 (A) 05/26/2019       Assessment & Plan:   Problem List Items Addressed This Visit    None    Visit Diagnoses    Need for prophylactic vaccination with combined diphtheria-tetanus-pertussis (DTP) vaccine    -  Primary   Relevant Orders   Tdap vaccine  greater than or equal to 7yo IM (Completed)   Possible urinary tract infection       Relevant Medications   nitrofurantoin, macrocrystal-monohydrate, (MACROBID) 100 MG capsule   Other Relevant Orders   POCT urinalysis dipstick (Completed)   Urine Culture (Completed)   Elevated glucose       Leukocytes in urine       Relevant Medications   nitrofurantoin, macrocrystal-monohydrate, (MACROBID) 100 MG capsule       Meds ordered this encounter  Medications  . nitrofurantoin, macrocrystal-monohydrate, (MACROBID) 100 MG capsule    Sig: Take 1 capsule (100 mg total) by mouth 2 (two) times daily for 5 days.    Dispense:  10 capsule    Refill:  0    Order Specific Question:   Supervising Provider    Answer:   Forrest Moron [id number]   PLAN  Multiple species present on last culture Will recollect today  Given results of UA dip, will tx with macrobid 179m PO bid for 5 days  Return if symptoms worsen or fail to improve  Will follow up on culture as warranted  Patient encouraged to call clinic with any questions, comments, or concerns.  RMaximiano Coss NP

## 2019-06-12 ENCOUNTER — Ambulatory Visit: Payer: 59 | Admitting: Physician Assistant

## 2019-07-16 ENCOUNTER — Encounter: Payer: Self-pay | Admitting: Registered Nurse

## 2019-07-26 ENCOUNTER — Other Ambulatory Visit (HOSPITAL_COMMUNITY)
Admission: RE | Admit: 2019-07-26 | Discharge: 2019-07-26 | Disposition: A | Discharge status: Home / Self Care | Payer: 59 | Source: Ambulatory Visit | Attending: Advanced Practice Midwife | Admitting: Advanced Practice Midwife

## 2019-07-26 ENCOUNTER — Encounter: Payer: Self-pay | Admitting: Advanced Practice Midwife

## 2019-07-26 ENCOUNTER — Other Ambulatory Visit: Payer: Self-pay

## 2019-07-26 ENCOUNTER — Ambulatory Visit (INDEPENDENT_AMBULATORY_CARE_PROVIDER_SITE_OTHER): Payer: 59 | Admitting: Advanced Practice Midwife

## 2019-07-26 VITALS — BP 153/100 | HR 80 | Wt 161.8 lb

## 2019-07-26 DIAGNOSIS — R030 Elevated blood-pressure reading, without diagnosis of hypertension: Secondary | ICD-10-CM | POA: Insufficient documentation

## 2019-07-26 DIAGNOSIS — Z01419 Encounter for gynecological examination (general) (routine) without abnormal findings: Secondary | ICD-10-CM | POA: Insufficient documentation

## 2019-07-26 DIAGNOSIS — Z113 Encounter for screening for infections with a predominantly sexual mode of transmission: Secondary | ICD-10-CM | POA: Diagnosis not present

## 2019-07-26 LAB — POCT URINE QUALITATIVE DIPSTICK BLOOD: Blood, UA: NEGATIVE

## 2019-07-26 NOTE — Progress Notes (Signed)
.    GYNECOLOGY ANNUAL PREVENTATIVE CARE ENCOUNTER NOTE  History:     Debra BOKHARI is a 49 y.o. G30P1030 female here for a routine annual gynecologic exam.  Current complaints: None.   Denies abnormal vaginal bleeding, discharge, pelvic pain, problems with intercourse or other gynecologic concerns.    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   Patient states she and her husband have a very satisfying sex life and she wonders if she can take a female Viagra to keep up with him. She endorses excellent partner communicated and does not feel pressured in any way.   Gynecologic History No LMP recorded. (Menstrual status: IUD). Contraception: IUD Last Pap: 07/20/2018. Results were: normal with negative HPV Last mammogram: Remote. Results were: normal  Obstetric History OB History  Gravida Para Term Preterm AB Living  _0 0 3    SAB TAB Ectopic Multiple Live Births  0 3 0        # Outcome Date GA Lbr Len/2nd Weight Sex Delivery Anes PTL Lv  4 TAB           3 TAB           2 TAB           1 Term             Past Medical History:  Diagnosis Date  . Back pain   . Chronic kidney disease    kidney stones  . Diabetes mellitus without complication ([place])   . History of kidney stones     Past Surgical History:  Procedure Laterality Date  . CHOLECYSTECTOMY, LAPAROSCOPIC    . CYSTOSCOPY WITH URETEROSCOPY AND STENT PLACEMENT Bilateral 07/29/2012   Procedure: CYSTOSCOPY WITH BILATERAL URETEROSCOPY AND BILATERAL STENT PLACEMENT, BASKET EXTRACTION OF BILATERAL URETERAL STONES;  Surgeon: Alexis Frock, MD;  Location: [facility];  Service: Urology;  Laterality: Bilateral;    Current Outpatient Medications on File Prior to Visit  Medication Sig Dispense Refill  . glucose blood test strip Check your sugar in the morning before you eat breakfast, and one hour after  a meal. 100 each 2  . glucose monitoring kit (FREESTYLE) monitoring kit 1 each by Does not apply route daily. Check glucose once in the morning before breakfast and 1 hour after a meal 1 each 0  . levonorgestrel (MIRENA) 20 MCG/24HR IUD 1 each by Intrauterine route once.    . metFORMIN (GLUCOPHAGE) 500 MG tablet Take 1 tablet (500 mg total) by mouth daily with breakfast. 180 tablet 1  . betamethasone valerate ointment (VALISONE) 0.1 % Apply 1 application topically 2 (two) times daily. (Patient not taking: Reported on 07/26/2019) 30 g 0  . ibuprofen (ADVIL) 600 MG tablet Take 1 tablet (600 mg total) by mouth every 6 (six) hours as needed. (Patient not taking: Reported on 07/26/2019) 30 tablet 0  . ibuprofen (ADVIL) 800 MG tablet Take 1 tablet (800 mg total) by mouth every 8 (eight) hours as needed. (Patient not taking: Reported on 07/26/2019) 30 tablet 0  . metFORMIN (GLUCOPHAGE) 500 MG tablet Take 1 tablet (500 mg total) by mouth 2 (two) times daily with a meal. 1 tab po in the a.m. for 1 week, 1 tab twice a day 60 tablet 0  . phenazopyridine (PYRIDIUM) 200 MG tablet Take 1 tablet (200 mg total) by mouth 3 (three) times daily  as needed for pain. (Patient not taking: Reported on 07/26/2019) 6 tablet 0  . [DISCONTINUED] famotidine (PEPCID) 40 MG tablet Take 1 tablet (40 mg total) by mouth daily. 30 tablet 3  . [DISCONTINUED] levocetirizine (XYZAL) 5 MG tablet Take 1 tablet (5 mg total) by mouth every evening. 30 tablet 3  . [DISCONTINUED] SUMAtriptan (IMITREX) 25 MG tablet Take 1 tablet (25 mg total) by mouth every 2 (two) hours as needed for migraine. May repeat in 2 hours if headache persists or recurs. 10 tablet 2   No current facility-administered medications on file prior to visit.    Allergies  Allergen Reactions  . Tramadol Hives, Itching and Rash  . Hydrocodone-Acetaminophen Itching  . Flomax [Tamsulosin Hcl] Palpitations    headache    Social History:  reports that she has never smoked.  She has never used smokeless tobacco. She reports that she does not drink alcohol and does not use drugs.  Family History  Problem Relation Age of Onset  . Other Neg Hx     The following portions of the patient's history were reviewed and updated as appropriate: allergies, current medications, past family history, past medical history, past social history, past surgical history and problem list.  Review of Systems Pertinent items noted in HPI and remainder of comprehensive ROS otherwise negative.  Physical Exam:  BP (!) 153/100   Pulse 80   Wt 161 lb 12.8 oz (73.4 kg)   BMI 32.68 kg/m  CONSTITUTIONAL: Well-developed, well-nourished female in no acute distress.  HENT:  Normocephalic, atraumatic, External right and left ear normal. Oropharynx is clear and moist EYES: Conjunctivae and EOM are normal. Pupils are equal, round, and reactive to light. No scleral icterus.  NECK: Normal range of motion, supple, no masses.  Normal thyroid.  SKIN: Skin is warm and dry. No rash noted. Not diaphoretic. No erythema. No pallor. MUSCULOSKELETAL: Normal range of motion. No tenderness.  No cyanosis, clubbing, or edema.  2+ distal pulses. NEUROLOGIC: Alert and oriented to person, place, and time. Normal reflexes, muscle tone coordination.  PSYCHIATRIC: Normal mood and affect. Normal behavior. Normal judgment and thought content. CARDIOVASCULAR: Normal heart rate noted, regular rhythm RESPIRATORY: Clear to auscultation bilaterally. Effort and breath sounds normal, no problems with respiration noted. BREASTS: Symmetric in size. No masses, tenderness, skin changes, nipple drainage, or lymphadenopathy bilaterally. Performed in the presence of a chaperone. ABDOMEN: Soft, no distention noted.  No tenderness, rebound or guarding.  PELVIC: Normal appearing external genitalia and urethral meatus; normal appearing vaginal mucosa and cervix.  No abnormal discharge noted.  Pap smear obtained.  Normal uterine size, no  other palpable masses, no uterine or adnexal tenderness.  Performed in the presence of a chaperone.   Assessment and Plan:    1. Well woman exam with routine gynecological exam - Routine care - 2020 mammogram canceled by patient, reordered today - Patient verbalizes understanding that pap is not due, prefers annual pap - Cytology - PAP( [place]) - POCT urine qual dipstick blood - Urine Culture - Cervicovaginal ancillary only( [place]) - HIV Antibody (routine testing w rflx) - RPR - Hep C Antibody  2. Screening examination for STD (sexually transmitted disease) - Per patient request - Husband does not get tested, encouraged partner testing  3. Elevated blood pressure reading without diagnosis of hypertension - PCP surveillance, 158/100 today   Will follow up results of pap smear and manage accordingly. Mammogram scheduled Routine preventative health maintenance measures emphasized. Please refer to After  Visit Summary for other counseling recommendations.     Mallie Snooks, MSN, CNM Certified Nurse Midwife, [facility] for [employer], [facility] 07/26/19 12:43 PM

## 2019-07-26 NOTE — Progress Notes (Signed)
Last pap 07/20/2018-normal  Sti testing

## 2019-07-26 NOTE — Patient Instructions (Signed)
Preventive Care 40-49 Years Old, Female °Preventive care refers to visits with your health care provider and lifestyle choices that can promote health and wellness. This includes: °· A yearly physical exam. This may also be called an annual well check. °· Regular dental visits and eye exams. °· Immunizations. °· Screening for certain conditions. °· Healthy lifestyle choices, such as eating a healthy diet, getting regular exercise, not using drugs or products that contain nicotine and tobacco, and limiting alcohol use. °What can I expect for my preventive care visit? °Physical exam °Your health care provider will check your: °· Height and weight. This may be used to calculate body mass index (BMI), which tells if you are at a healthy weight. °· Heart rate and blood pressure. °· Skin for abnormal spots. °Counseling °Your health care provider may ask you questions about your: °· Alcohol, tobacco, and drug use. °· Emotional well-being. °· Home and relationship well-being. °· Sexual activity. °· Eating habits. °· Work and work environment. °· Method of birth control. °· Menstrual cycle. °· Pregnancy history. °What immunizations do I need? ° °Influenza (flu) vaccine °· This is recommended every year. °Tetanus, diphtheria, and pertussis (Tdap) vaccine °· You may need a Td booster every 10 years. °Varicella (chickenpox) vaccine °· You may need this if you have not been vaccinated. °Zoster (shingles) vaccine °· You may need this after age 60. °Measles, mumps, and rubella (MMR) vaccine °· You may need at least one dose of MMR if you were born in 1957 or later. You may also need a second dose. °Pneumococcal conjugate (PCV13) vaccine °· You may need this if you have certain conditions and were not previously vaccinated. °Pneumococcal polysaccharide (PPSV23) vaccine °· You may need one or two doses if you smoke cigarettes or if you have certain conditions. °Meningococcal conjugate (MenACWY) vaccine °· You may need this if you  have certain conditions. °Hepatitis A vaccine °· You may need this if you have certain conditions or if you travel or work in places where you may be exposed to hepatitis A. °Hepatitis B vaccine °· You may need this if you have certain conditions or if you travel or work in places where you may be exposed to hepatitis B. °Haemophilus influenzae type b (Hib) vaccine °· You may need this if you have certain conditions. °Human papillomavirus (HPV) vaccine °· If recommended by your health care provider, you may need three doses over 6 months. °You may receive vaccines as individual doses or as more than one vaccine together in one shot (combination vaccines). Talk with your health care provider about the risks and benefits of combination vaccines. °What tests do I need? °Blood tests °· Lipid and cholesterol levels. These may be checked every 5 years, or more frequently if you are over 50 years old. °· Hepatitis C test. °· Hepatitis B test. °Screening °· Lung cancer screening. You may have this screening every year starting at age 55 if you have a 30-pack-year history of smoking and currently smoke or have quit within the past 15 years. °· Colorectal cancer screening. All adults should have this screening starting at age 50 and continuing until age 75. Your health care provider may recommend screening at age 45 if you are at increased risk. You will have tests every 1-10 years, depending on your results and the type of screening test. °· Diabetes screening. This is done by checking your blood sugar (glucose) after you have not eaten for a while (fasting). You may have this   done every 1-3 years.  Mammogram. This may be done every 1-2 years. Talk with your health care provider about when you should start having regular mammograms. This may depend on whether you have a family history of breast cancer.  BRCA-related cancer screening. This may be done if you have a family history of breast, ovarian, tubal, or peritoneal  cancers.  Pelvic exam and Pap test. This may be done every 3 years starting at age 48. Starting at age 52, this may be done every 5 years if you have a Pap test in combination with an HPV test. Other tests  Sexually transmitted disease (STD) testing.  Bone density scan. This is done to screen for osteoporosis. You may have this scan if you are at high risk for osteoporosis. Follow these instructions at home: Eating and drinking  Eat a diet that includes fresh fruits and vegetables, whole grains, lean protein, and low-fat dairy.  Take vitamin and mineral supplements as recommended by your health care provider.  Do not drink alcohol if: ? Your health care provider tells you not to drink. ? You are pregnant, may be pregnant, or are planning to become pregnant.  If you drink alcohol: ? Limit how much you have to 0-1 drink a day. ? Be aware of how much alcohol is in your drink. In the [place] one drink equals one 12 oz bottle of beer (355 mL), one 5 oz glass of wine (148 mL), or one 1 oz glass of hard liquor (44 mL). Lifestyle  Take daily care of your teeth and gums.  Stay active. Exercise for at least 30 minutes on 5 or more days each week.  Do not use any products that contain nicotine or tobacco, such as cigarettes, e-cigarettes, and chewing tobacco. If you need help quitting, ask your health care provider.  If you are sexually active, practice safe sex. Use a condom or other form of birth control (contraception) in order to prevent pregnancy and STIs (sexually transmitted infections).  If told by your health care provider, take low-dose aspirin daily starting at age 98. What's next?  Visit your health care provider once a year for a well check visit.  Ask your health care provider how often you should have your eyes and teeth checked.  Stay up to date on all vaccines. This information is not intended to replace advice given to you by your health care provider. Make sure you  discuss any questions you have with your health care provider. Document Revised: 09/02/2017 Document Reviewed: 09/02/2017 Elsevier Patient Education  2020 [employer].

## 2019-07-27 LAB — CERVICOVAGINAL ANCILLARY ONLY
Bacterial Vaginitis (gardnerella): POSITIVE — AB
Candida Glabrata: NEGATIVE
Candida Vaginitis: NEGATIVE
Chlamydia: NEGATIVE
Comment: NEGATIVE
Comment: NEGATIVE
Comment: NEGATIVE
Comment: NEGATIVE
Comment: NEGATIVE
Comment: NORMAL
Neisseria Gonorrhea: NEGATIVE
Trichomonas: NEGATIVE

## 2019-07-27 LAB — URINE CULTURE: Organism ID, Bacteria: NO GROWTH

## 2019-07-27 LAB — HEPATITIS C ANTIBODY: Hep C Virus Ab: <0.1 s/co ratio (ref 0.0–0.9)

## 2019-07-27 LAB — HIV ANTIBODY (ROUTINE TESTING W REFLEX): HIV Screen 4th Generation wRfx: NONREACTIVE

## 2019-07-27 LAB — RPR: RPR Ser Ql: NONREACTIVE

## 2019-07-28 ENCOUNTER — Other Ambulatory Visit: Payer: Self-pay | Admitting: *Deleted

## 2019-07-28 LAB — CYTOLOGY - PAP
Comment: NEGATIVE
Diagnosis: NEGATIVE
High risk HPV: NEGATIVE

## 2019-07-28 MED ORDER — METRONIDAZOLE 500 MG PO TABS: 500.0000 mg | ORAL_TABLET | Freq: Two times a day (BID) | ORAL | 0 refills | Status: DC

## 2019-08-11 ENCOUNTER — Ambulatory Visit
Admission: RE | Admit: 2019-08-11 | Discharge: 2019-08-11 | Disposition: A | Discharge status: Home / Self Care | Payer: 59 | Source: Ambulatory Visit | Attending: Advanced Practice Midwife | Admitting: Advanced Practice Midwife

## 2019-08-11 ENCOUNTER — Other Ambulatory Visit: Payer: Self-pay

## 2019-08-11 DIAGNOSIS — Z01419 Encounter for gynecological examination (general) (routine) without abnormal findings: Secondary | ICD-10-CM

## 2019-08-14 ENCOUNTER — Other Ambulatory Visit: Payer: Self-pay | Admitting: Advanced Practice Midwife

## 2019-08-14 DIAGNOSIS — R928 Other abnormal and inconclusive findings on diagnostic imaging of breast: Secondary | ICD-10-CM

## 2019-08-17 ENCOUNTER — Telehealth: Payer: Self-pay | Admitting: Registered Nurse

## 2019-08-17 NOTE — Telephone Encounter (Signed)
Referral Followup °

## 2019-12-26 DIAGNOSIS — M545 Low back pain, unspecified: Secondary | ICD-10-CM | POA: Insufficient documentation

## 2020-05-29 ENCOUNTER — Encounter: Payer: Self-pay | Admitting: Radiology

## 2020-06-19 ENCOUNTER — Telehealth: Payer: Self-pay | Admitting: Registered Nurse

## 2020-06-19 NOTE — Telephone Encounter (Signed)
I have this patient paperwork from the [facility] up front will start my portion of this paperwork then will give to morrow to complete.

## 2020-06-19 NOTE — Telephone Encounter (Signed)
..  Type of form received:  Medical Clearance  Additional comments:   Received by: Tye Savoy should be Faxed to:[phone]  Form should be mailed to:    Is patient requesting call for pickup:   Form placed:  In [employer] bin up front  Attach charge sheet.  Provider will determine charge.  Individual made aware of 3-5 business day turn around (Y/N)?

## 2020-08-05 ENCOUNTER — Encounter (HOSPITAL_COMMUNITY): Payer: Self-pay

## 2020-08-05 ENCOUNTER — Emergency Department (HOSPITAL_COMMUNITY)
Admission: EM | Admit: 2020-08-05 | Discharge: 2020-08-05 | Disposition: A | Discharge status: Home / Self Care | Payer: 59 | Attending: Emergency Medicine | Admitting: Emergency Medicine

## 2020-08-05 ENCOUNTER — Emergency Department (HOSPITAL_COMMUNITY): Payer: 59

## 2020-08-05 ENCOUNTER — Other Ambulatory Visit: Payer: Self-pay

## 2020-08-05 DIAGNOSIS — Z79899 Other long term (current) drug therapy: Secondary | ICD-10-CM | POA: Insufficient documentation

## 2020-08-05 DIAGNOSIS — I129 Hypertensive chronic kidney disease with stage 1 through stage 4 chronic kidney disease, or unspecified chronic kidney disease: Secondary | ICD-10-CM | POA: Insufficient documentation

## 2020-08-05 DIAGNOSIS — E1122 Type 2 diabetes mellitus with diabetic chronic kidney disease: Secondary | ICD-10-CM | POA: Insufficient documentation

## 2020-08-05 DIAGNOSIS — N189 Chronic kidney disease, unspecified: Secondary | ICD-10-CM | POA: Diagnosis not present

## 2020-08-05 DIAGNOSIS — N132 Hydronephrosis with renal and ureteral calculous obstruction: Secondary | ICD-10-CM | POA: Insufficient documentation

## 2020-08-05 DIAGNOSIS — Z7984 Long term (current) use of oral hypoglycemic drugs: Secondary | ICD-10-CM | POA: Diagnosis not present

## 2020-08-05 DIAGNOSIS — R109 Unspecified abdominal pain: Secondary | ICD-10-CM | POA: Diagnosis present

## 2020-08-05 DIAGNOSIS — N9489 Other specified conditions associated with female genital organs and menstrual cycle: Secondary | ICD-10-CM | POA: Insufficient documentation

## 2020-08-05 DIAGNOSIS — N201 Calculus of ureter: Secondary | ICD-10-CM

## 2020-08-05 LAB — COMPREHENSIVE METABOLIC PANEL
ALT: 15 U/L (ref 0–44)
AST: 19 U/L (ref 15–41)
Albumin: 4.1 g/dL (ref 3.5–5.0)
Alkaline Phosphatase: 74 U/L (ref 38–126)
Anion gap: 10 (ref 5–15)
BUN: 16 mg/dL (ref 6–20)
CO2: 26 mmol/L (ref 22–32)
Calcium: 9.7 mg/dL (ref 8.9–10.3)
Chloride: 98 mmol/L (ref 98–111)
Creatinine, Ser: 0.92 mg/dL (ref 0.44–1.00)
GFR, Estimated: >60 mL/min (ref >60)
Glucose, Bld: 193 mg/dL — ABNORMAL HIGH (ref 70–99)
Potassium: 3.9 mmol/L (ref 3.5–5.1)
Sodium: 134 mmol/L — ABNORMAL LOW (ref 135–145)
Total Bilirubin: 0.7 mg/dL (ref 0.3–1.2)
Total Protein: 7.9 g/dL (ref 6.5–8.1)

## 2020-08-05 LAB — CBC
HCT: 43.6 % (ref 36.0–46.0)
Hemoglobin: 14.4 g/dL (ref 12.0–15.0)
MCH: 28.3 pg (ref 26.0–34.0)
MCHC: 33 g/dL (ref 30.0–36.0)
MCV: 85.7 fL (ref 80.0–100.0)
Platelets: 355 10*3/uL (ref 150–400)
RBC: 5.09 MIL/uL (ref 3.87–5.11)
RDW: 12.4 % (ref 11.5–15.5)
WBC: 16.2 10*3/uL — ABNORMAL HIGH (ref 4.0–10.5)
nRBC: 0 % (ref 0.0–0.2)

## 2020-08-05 LAB — URINALYSIS, ROUTINE W REFLEX MICROSCOPIC
Bilirubin Urine: NEGATIVE
Glucose, UA: 50 mg/dL — AB
Ketones, ur: 5 mg/dL — AB
Leukocytes,Ua: NEGATIVE
Nitrite: NEGATIVE
Protein, ur: >=300 mg/dL — AB
RBC / HPF: >50 RBC/hpf — ABNORMAL HIGH (ref 0–5)
Specific Gravity, Urine: 1.026 (ref 1.005–1.030)
pH: 6 (ref 5.0–8.0)

## 2020-08-05 LAB — LIPASE, BLOOD: Lipase: 27 U/L (ref 11–51)

## 2020-08-05 LAB — I-STAT BETA HCG BLOOD, ED (MC, WL, AP ONLY): I-stat hCG, quantitative: <5 m[IU]/mL (ref <5)

## 2020-08-05 MED ORDER — ONDANSETRON HCL 4 MG/2ML IJ SOLN
4.0000 mg | Freq: Once | INTRAMUSCULAR | Status: AC
  Administered 2020-08-05: 4 mg via INTRAVENOUS
  Filled 2020-08-05: qty 2

## 2020-08-05 MED ORDER — ONDANSETRON 4 MG PO TBDP: 4.0000 mg | ORAL_TABLET | Freq: Three times a day (TID) | ORAL | 0 refills | Status: DC | PRN

## 2020-08-05 MED ORDER — KETOROLAC TROMETHAMINE 15 MG/ML IJ SOLN
15.0000 mg | Freq: Once | INTRAMUSCULAR | Status: AC
  Administered 2020-08-05: 15 mg via INTRAVENOUS
  Filled 2020-08-05: qty 1

## 2020-08-05 MED ORDER — SODIUM CHLORIDE 0.9 % IV BOLUS
1000.0000 mL | Freq: Once | INTRAVENOUS | Status: AC
  Administered 2020-08-05: 1000 mL via INTRAVENOUS

## 2020-08-05 MED ORDER — OXYCODONE HCL 5 MG PO TABS
5.0000 mg | ORAL_TABLET | Freq: Once | ORAL | Status: AC
  Administered 2020-08-05: 5 mg via ORAL
  Filled 2020-08-05: qty 1

## 2020-08-05 MED ORDER — NAPROXEN 500 MG PO TABS: 500.0000 mg | ORAL_TABLET | Freq: Two times a day (BID) | ORAL | 0 refills | Status: DC

## 2020-08-05 MED ORDER — OXYCODONE HCL 5 MG PO TABS: 5.0000 mg | ORAL_TABLET | Freq: Four times a day (QID) | ORAL | 0 refills | Status: DC | PRN

## 2020-08-05 MED ORDER — HYDROMORPHONE HCL 1 MG/ML IJ SOLN
0.5000 mg | Freq: Once | INTRAMUSCULAR | Status: AC
  Administered 2020-08-05: 0.5 mg via INTRAVENOUS
  Filled 2020-08-05: qty 1

## 2020-08-05 MED ORDER — OXYCODONE-ACETAMINOPHEN 5-325 MG PO TABS
1.0000 | ORAL_TABLET | ORAL | Status: DC | PRN
  Administered 2020-08-05: 1 via ORAL
  Filled 2020-08-05: qty 1

## 2020-08-05 MED ORDER — ONDANSETRON 4 MG PO TBDP
4.0000 mg | ORAL_TABLET | Freq: Once | ORAL | Status: AC | PRN
  Administered 2020-08-05: 4 mg via ORAL
  Filled 2020-08-05: qty 1

## 2020-08-05 NOTE — Discharge Instructions (Addendum)
Please read and follow all provided instructions.  Your diagnoses today include:  1. Right ureteral stone     Tests performed today include: Urine test that showed blood in your urine and no infection CT scan which showed a 8 millimeter kidney on the right side Blood test that showed normal kidney function Vital signs. See below for your results today.   Medications prescribed:  Percocet (oxycodone/acetaminophen) - narcotic pain medication  DO NOT drive or perform any activities that require you to be awake and alert because this medicine can make you drowsy. BE VERY CAREFUL not to take multiple medicines containing Tylenol (also called acetaminophen). Doing so can lead to an overdose which can damage your liver and cause liver failure and possibly death.  Naproxen - anti-inflammatory pain medication Do not exceed '500mg'$  naproxen every 12 hours, take with food  You have been prescribed an anti-inflammatory medication or NSAID. Take with food. Take smallest effective dose for the shortest duration needed for your pain. Stop taking if you experience stomach pain or vomiting.   Zofran (ondansetron) - for nausea and vomiting  Take any prescribed medications only as directed.  Home care instructions:  Follow any educational materials contained in this packet.  Please double your fluid intake for the next several days. Strain your urine and save any stones that may pass.   BE VERY CAREFUL not to take multiple medicines containing Tylenol (also called acetaminophen). Doing so can lead to an overdose which can damage your liver and cause liver failure and possibly death.   Follow-up instructions: Please follow-up with your urologist or the urologist referral (provided on front page) in the next 1 week for further evaluation of your symptoms.  Return instructions:  If you need to return to the Emergency Department, go to [facility] and not [facility]. The urologists are  located at [facility] and can better care for you at this location.  Please return to the Emergency Department if you experience worsening symptoms.  Please return if you develop fever or uncontrolled pain or vomiting. Please return if you have any other emergent concerns.  Additional Information:  Your vital signs today were: BP (!) 171/93   Pulse 80   Temp 98.6 F (37 C)   Resp 18   Ht '4\' 11"'$  (1.499 m)   Wt 72.6 kg   SpO2 98%   BMI 32.32 kg/m  If your blood pressure (BP) was elevated above 135/85 this visit, please have this repeated by your doctor within one month. --------------

## 2020-08-05 NOTE — ED Notes (Signed)
Pt called for treatment room, no answer.  

## 2020-08-05 NOTE — ED Notes (Signed)
Patient states that she did not hear her name called.

## 2020-08-05 NOTE — ED Triage Notes (Signed)
Pt reports right lower back pain that radiates to her pelvic area that started this morning. Hx of kidney stones a few months ago and states this pain feels similar. Emesis x1 this morning.

## 2020-08-05 NOTE — ED Provider Notes (Signed)
[place] EMERGENCY DEPARTMENT Provider Note   CSN: [id number] Arrival date & time: 08/05/20  1033     History Chief Complaint  Patient presents with   Back Pain   Emesis    [name] is a 50 y.o. female.  Patient with history of kidney stones, diabetes presents to the emergency department for evaluation of right-sided flank and lower back pain.  Patient states that she had similar pain about a week ago, relieved with Goody powder.  She states that the symptoms returned this morning.  She had associated vomiting.  Pain was severe and feels similar to previous kidney stones.  She continues to urinate.  Currently she states that she is hungry.  Unrelieved with OTC meds this morning.  The onset of this condition was acute. The course is constant. Aggravating factors: none. Alleviating factors: none.        Past Medical History:  Diagnosis Date   Back pain    Chronic kidney disease    kidney stones   Diabetes mellitus without complication ([place])    History of kidney stones     Patient Active Problem List   Diagnosis Date Noted   Elevated blood pressure reading without diagnosis of hypertension 07/26/2019   Prediabetes 07/21/2018   Serum cholesterol elevated 07/21/2018   Pain in thoracic spine 06/22/2012   Lumbago 06/22/2012    Past Surgical History:  Procedure Laterality Date   CHOLECYSTECTOMY, LAPAROSCOPIC     CYSTOSCOPY WITH URETEROSCOPY AND STENT PLACEMENT Bilateral 07/29/2012   Procedure: CYSTOSCOPY WITH BILATERAL URETEROSCOPY AND BILATERAL STENT PLACEMENT, BASKET EXTRACTION OF BILATERAL URETERAL STONES;  Surgeon: Alexis Frock, MD;  Location: [facility];  Service: Urology;  Laterality: Bilateral;     OB History     Gravida  4   Para  1   Term  1   Preterm  0   AB  3   Living         SAB  0   IAB  3   Ectopic  0   Multiple      Live Births              Family History  Problem Relation Age of Onset   Other Neg Hx      Social History   Tobacco Use   Smoking status: Never   Smokeless tobacco: Never  Substance Use Topics   Alcohol use: No    Comment: occ   Drug use: No    Comment: takes Hydrocodone for R Flank pain for past 3 years;    Home Medications Prior to Admission medications   Medication Sig Start Date End Date Taking? Authorizing Provider  betamethasone valerate ointment (VALISONE) 0.1 % Apply 1 application topically 2 (two) times daily. Patient not taking: Reported on 07/26/2019 05/26/19   Maximiano Coss, NP  glucose blood test strip Check your sugar in the morning before you eat breakfast, and one hour after a meal. 04/14/19   Melynda Ripple, MD  glucose monitoring kit (FREESTYLE) monitoring kit 1 each by Does not apply route daily. Check glucose once in the morning before breakfast and 1 hour after a meal 04/14/19   Melynda Ripple, MD  ibuprofen (ADVIL) 600 MG tablet Take 1 tablet (600 mg total) by mouth every 6 (six) hours as needed. Patient not taking: Reported on 07/26/2019 04/14/19   Melynda Ripple, MD  ibuprofen (ADVIL) 800 MG tablet Take 1 tablet (800 mg total) by mouth every 8 (eight) hours  as needed. Patient not taking: Reported on 07/26/2019 05/26/19   Maximiano Coss, NP  levonorgestrel [facility]) 20 MCG/24HR IUD 1 each by Intrauterine route once.    [provider]  metFORMIN (GLUCOPHAGE) 500 MG tablet Take 1 tablet (500 mg total) by mouth 2 (two) times daily with a meal. 1 tab po in the a.m. for 1 week, 1 tab twice a day 04/14/19 05/14/19  Melynda Ripple, MD  metFORMIN (GLUCOPHAGE) 500 MG tablet Take 1 tablet (500 mg total) by mouth daily with breakfast. 05/26/19   Maximiano Coss, NP  metroNIDAZOLE (FLAGYL) 500 MG tablet Take 1 tablet (500 mg total) by mouth 2 (two) times daily. 07/28/19   Darlina Rumpf, CNM  phenazopyridine (PYRIDIUM) 200 MG tablet Take 1 tablet (200 mg total) by mouth 3 (three) times daily as needed for pain. Patient not taking: Reported on  07/26/2019 04/14/19   Melynda Ripple, MD  famotidine (PEPCID) 40 MG tablet Take 1 tablet (40 mg total) by mouth daily. 02/12/17 04/14/19  Tereasa Coop, PA-C  levocetirizine (XYZAL) 5 MG tablet Take 1 tablet (5 mg total) by mouth every evening. 02/12/17 04/14/19  Tereasa Coop, PA-C  SUMAtriptan (IMITREX) 25 MG tablet Take 1 tablet (25 mg total) by mouth every 2 (two) hours as needed for migraine. May repeat in 2 hours if headache persists or recurs. 07/22/18 04/14/19  Maximiano Coss, NP    Allergies    Tramadol, Hydrocodone-acetaminophen, and Flomax [tamsulosin hcl]  Review of Systems   Review of Systems  Constitutional:  Negative for fever.  HENT:  Negative for rhinorrhea and sore throat.   Eyes:  Negative for redness.  Respiratory:  Negative for cough.   Cardiovascular:  Negative for chest pain.  Gastrointestinal:  Positive for abdominal pain, nausea and vomiting. Negative for diarrhea.  Genitourinary:  Positive for flank pain. Negative for dysuria, frequency, hematuria and urgency.  Musculoskeletal:  Negative for myalgias.  Skin:  Negative for rash.  Neurological:  Negative for headaches.   Physical Exam Updated Vital Signs BP (!) 154/96 (BP Location: Left Arm)   Pulse 82   Temp 98.6 F (37 C)   Resp 14   Ht '4\' 11"'  (1.499 m)   Wt 72.6 kg   SpO2 100%   BMI 32.32 kg/m   Physical Exam Vitals and nursing note reviewed.  Constitutional:      General: She is in acute distress (Appears uncomfortable).     Appearance: She is well-developed.  HENT:     Head: Normocephalic and atraumatic.     Right Ear: External ear normal.     Left Ear: External ear normal.     Nose: Nose normal.  Eyes:     Conjunctiva/sclera: Conjunctivae normal.  Cardiovascular:     Rate and Rhythm: Normal rate and regular rhythm.     Heart sounds: No murmur heard. Pulmonary:     Effort: No respiratory distress.     Breath sounds: No wheezing, rhonchi or rales.  Abdominal:     Palpations: Abdomen is  soft.     Tenderness: There is no abdominal tenderness. There is no guarding or rebound.  Musculoskeletal:     Cervical back: Normal range of motion and neck supple.     Right lower leg: No edema.     Left lower leg: No edema.  Skin:    General: Skin is warm and dry.     Findings: No rash.  Neurological:     General: No focal deficit present.  Mental Status: She is alert. Mental status is at baseline.     Motor: No weakness.  Psychiatric:        Mood and Affect: Mood normal.    ED Results / Procedures / Treatments   Labs (all labs ordered are listed, but only abnormal results are displayed) Labs Reviewed  COMPREHENSIVE METABOLIC PANEL - Abnormal; Notable for the following components:      Result Value   Sodium 134 (*)    Glucose, Bld 193 (*)    All other components within normal limits  CBC - Abnormal; Notable for the following components:   WBC 16.2 (*)    All other components within normal limits  URINALYSIS, ROUTINE W REFLEX MICROSCOPIC - Abnormal; Notable for the following components:   APPearance HAZY (*)    Glucose, UA 50 (*)    Hgb urine dipstick MODERATE (*)    Ketones, ur 5 (*)    Protein, ur >=300 (*)    RBC / HPF >50 (*)    Bacteria, UA RARE (*)    All other components within normal limits  URINE CULTURE  LIPASE, BLOOD  I-STAT BETA HCG BLOOD, ED ([facility], [facility], [facility] ONLY)    EKG None  Radiology CT Renal Stone Study  Result Date: 08/05/2020 CLINICAL DATA:  50 year old female with flank pain. EXAM: CT ABDOMEN AND PELVIS WITHOUT CONTRAST TECHNIQUE: Multidetector CT imaging of the abdomen and pelvis was performed following the standard protocol without IV contrast. CONTRAST:  None COMPARISON:  CT abdomen and pelvis, 12/31/2016 FINDINGS: Lower chest: No acute abnormality. Hepatobiliary: No focal liver abnormality is seen. Status post cholecystectomy. Distal common bile duct distension, measuring up to 15 mm, likely compensatory. Pancreas: Unremarkable. No pancreatic  ductal dilatation or surrounding inflammatory changes. Spleen: Normal in size without focal abnormality. Adrenals/Urinary Tract: Adrenal glands are unremarkable. Non-obstructing nephroliths within the left renal collecting system, largest measuring up to 5 mm. Perirenal stranding and right moderate hydroureteronephrosis, secondary to 8 mm calculus within the right distal ureter. No bladder stones. Stomach/Bowel: Stomach is within normal limits. Appendix appears normal. No evidence of bowel wall thickening, distention, or inflammatory changes. Vascular/Lymphatic: No significant vascular findings are present. No enlarged abdominal or pelvic lymph nodes. Reproductive: Intrauterine device. Small, exophytic uterine fibroid. Other: No abdominal wall hernia or abnormality. No abdominopelvic ascites. Musculoskeletal: Multilevel degenerative change of the imaged thoracic spine. No acute or significant osseous findings. IMPRESSION: Obstructing 8 mm calculus within the right distal ureter, with associated moderate right hydronephrosis. Electronically Signed   By: Michaelle Birks MD   On: 08/05/2020 13:07    Procedures Procedures   Medications Ordered in ED Medications  oxyCODONE-acetaminophen (PERCOCET/ROXICET) 5-325 MG per tablet 1 tablet (1 tablet Oral Given 08/05/20 1125)  ketorolac (TORADOL) 15 MG/ML injection 15 mg (has no administration in time range)  ondansetron (ZOFRAN) injection 4 mg (has no administration in time range)  HYDROmorphone (DILAUDID) injection 0.5 mg (has no administration in time range)  sodium chloride 0.9 % bolus 1,000 mL (has no administration in time range)  ondansetron (ZOFRAN-ODT) disintegrating tablet 4 mg (4 mg Oral Given 08/05/20 1126)    ED Course  I have reviewed the triage vital signs and the nursing notes.  Pertinent labs & imaging results that were available during my care of the patient were reviewed by me and considered in my medical decision making (see chart for  details).  Patient seen and examined. Work-up reviewed.  She has 8 mm distal right-sided ureteral stone consistent with  history.  Normal creatinine.  We will give IV fluids, pain control.  Patient is hungry and wants to eat.  Vital signs reviewed and are as follows: BP (!) 154/96 (BP Location: Left Arm)   Pulse 82   Temp 98.6 F (37 C)   Resp 14   Ht '4\' 11"'  (1.499 m)   Wt 72.6 kg   SpO2 100%   BMI 32.32 kg/m   Patient rechecked.  After IV pain medication, she appears more comfortable.  She states that pain still present but improved.  She is comfortable discharged home at this time.  Patient's husband at bedside.  She has had something to eat.  Patient counseled on kidney stone treatment. Urged patient to strain urine and save any stones. Urged urology follow-up and return to [facility] with any complications. Counseled patient to maintain good fluid intake.   Allergy to Flomax.   Patient counseled on use of narcotic pain medications. Counseled not to combine these medications with others containing tylenol. Urged not to drink alcohol, drive, or perform any other activities that requires focus while taking these medications. The patient verbalizes understanding and agrees with the plan.   Clinical Course as of 08/05/20 1951  Mon Aug 05, 2020  1543 Bacteria, UA(!): RARE Sent for culture. [HK]  1543 Creatinine: 0.92 [HK]  1543 WBC(!): 16.2 [HK]    Clinical Course User Index [HK] Delia Heady, PA-C   MDM Rules/Calculators/A&P                           Patient with right-sided ureteral colic.  8 mm stone, distal ureter.  Normal kidney function.  No signs of pyelonephritis.  Pain better controlled after treatment in the emergency department.  Ready for discharge to home at this time.  Final Clinical Impression(s) / ED Diagnoses Final diagnoses:  Right ureteral stone    Rx / DC Orders ED Discharge Orders          Ordered    oxyCODONE (OXY IR/ROXICODONE) 5 MG immediate release  tablet  Every 6 hours PRN        08/05/20 1949    ondansetron (ZOFRAN ODT) 4 MG disintegrating tablet  Every 8 hours PRN        08/05/20 1949    naproxen (NAPROSYN) 500 MG tablet  2 times daily        08/05/20 1949             Carlisle Cater, Hershal Coria 08/05/20 1953    Carmin Muskrat, MD 08/05/20 [phone]

## 2020-08-06 LAB — URINE CULTURE: Culture: NO GROWTH

## 2020-08-07 ENCOUNTER — Other Ambulatory Visit: Payer: Self-pay | Admitting: Urology

## 2020-08-07 DIAGNOSIS — N201 Calculus of ureter: Secondary | ICD-10-CM

## 2020-08-08 NOTE — Progress Notes (Signed)
Talked with patient . Instructions given. Arrival time 59. Cl. Liquids until 0715. Husband is the driver. Meds and history reviewed

## 2020-08-12 ENCOUNTER — Ambulatory Visit (HOSPITAL_BASED_OUTPATIENT_CLINIC_OR_DEPARTMENT_OTHER)
Admission: RE | Admit: 2020-08-12 | Discharge: 2020-08-12 | Disposition: A | Discharge status: Home / Self Care | Payer: 59 | Attending: Urology | Admitting: Urology

## 2020-08-12 ENCOUNTER — Encounter (HOSPITAL_BASED_OUTPATIENT_CLINIC_OR_DEPARTMENT_OTHER): Payer: Self-pay | Admitting: Urology

## 2020-08-12 ENCOUNTER — Ambulatory Visit (HOSPITAL_COMMUNITY): Payer: 59

## 2020-08-12 ENCOUNTER — Other Ambulatory Visit: Payer: Self-pay

## 2020-08-12 ENCOUNTER — Encounter (HOSPITAL_BASED_OUTPATIENT_CLINIC_OR_DEPARTMENT_OTHER)
Admission: RE | Disposition: A | Discharge status: Home / Self Care | Payer: Self-pay | Source: Home / Self Care | Attending: Urology

## 2020-08-12 DIAGNOSIS — I10 Essential (primary) hypertension: Secondary | ICD-10-CM | POA: Diagnosis not present

## 2020-08-12 DIAGNOSIS — Z888 Allergy status to other drugs, medicaments and biological substances status: Secondary | ICD-10-CM | POA: Diagnosis not present

## 2020-08-12 DIAGNOSIS — Z79899 Other long term (current) drug therapy: Secondary | ICD-10-CM | POA: Insufficient documentation

## 2020-08-12 DIAGNOSIS — N201 Calculus of ureter: Secondary | ICD-10-CM | POA: Insufficient documentation

## 2020-08-12 HISTORY — DX: Other specified postprocedural states: R11.2

## 2020-08-12 HISTORY — DX: Other specified postprocedural states: Z98.890

## 2020-08-12 HISTORY — PX: EXTRACORPOREAL SHOCK WAVE LITHOTRIPSY: SHX1557

## 2020-08-12 LAB — GLUCOSE, CAPILLARY: Glucose-Capillary: 164 mg/dL — ABNORMAL HIGH (ref 70–99)

## 2020-08-12 LAB — POCT PREGNANCY, URINE: Preg Test, Ur: NEGATIVE

## 2020-08-12 SURGERY — LITHOTRIPSY, ESWL
Anesthesia: LOCAL | Laterality: Right

## 2020-08-12 MED ORDER — CIPROFLOXACIN HCL 500 MG PO TABS
ORAL_TABLET | ORAL | Status: AC
  Filled 2020-08-12: qty 1

## 2020-08-12 MED ORDER — ONDANSETRON 4 MG PO TBDP: 4.0000 mg | ORAL_TABLET | Freq: Three times a day (TID) | ORAL | 1 refills | Status: DC | PRN

## 2020-08-12 MED ORDER — SODIUM CHLORIDE 0.9 % IV SOLN: INTRAVENOUS | Status: DC

## 2020-08-12 MED ORDER — CIPROFLOXACIN HCL 500 MG PO TABS
500.0000 mg | ORAL_TABLET | ORAL | Status: AC
  Administered 2020-08-12: 500 mg via ORAL

## 2020-08-12 MED ORDER — DIPHENHYDRAMINE HCL 25 MG PO CAPS
25.0000 mg | ORAL_CAPSULE | ORAL | Status: AC
  Administered 2020-08-12: 25 mg via ORAL

## 2020-08-12 MED ORDER — DIAZEPAM 5 MG PO TABS
ORAL_TABLET | ORAL | Status: AC
  Filled 2020-08-12: qty 2

## 2020-08-12 MED ORDER — DIAZEPAM 5 MG PO TABS
10.0000 mg | ORAL_TABLET | ORAL | Status: AC
  Administered 2020-08-12: 10 mg via ORAL

## 2020-08-12 MED ORDER — DIPHENHYDRAMINE HCL 25 MG PO CAPS
ORAL_CAPSULE | ORAL | Status: AC
  Filled 2020-08-12: qty 1

## 2020-08-12 NOTE — H&P (Addendum)
See scanned [facility] documents for H&P.   

## 2020-08-12 NOTE — Op Note (Signed)
[facility] Operative Note  Treating Physician: Ellison Hughs, MD  Pre-op diagnosis: 1 cm right distal ureteral stone  Post-op diagnosis: Same   Procedure: Right ESWL  See [facility] OP note scanned into chart. Also because of the size, density, location and other factors that cannot be anticipated I feel this will likely be a staged procedure. This fact supersedes any indication in the scanned [place] [facility] operative note to the contrary

## 2020-08-13 ENCOUNTER — Encounter (HOSPITAL_BASED_OUTPATIENT_CLINIC_OR_DEPARTMENT_OTHER): Payer: Self-pay | Admitting: Urology

## 2020-08-28 DIAGNOSIS — G5601 Carpal tunnel syndrome, right upper limb: Secondary | ICD-10-CM | POA: Insufficient documentation

## 2020-08-28 DIAGNOSIS — M79641 Pain in right hand: Secondary | ICD-10-CM | POA: Insufficient documentation

## 2020-09-24 ENCOUNTER — Telehealth: Payer: Self-pay

## 2020-09-24 NOTE — Telephone Encounter (Signed)
Left a message to schedule the pts appt for annual and IUD consults. Last pap was 07/26/19

## 2020-10-29 HISTORY — PX: HAND SURGERY: SHX662

## 2020-11-08 DIAGNOSIS — Z4789 Encounter for other orthopedic aftercare: Secondary | ICD-10-CM | POA: Insufficient documentation

## 2020-11-14 ENCOUNTER — Encounter: Payer: Self-pay | Admitting: Advanced Practice Midwife

## 2020-11-14 ENCOUNTER — Ambulatory Visit (INDEPENDENT_AMBULATORY_CARE_PROVIDER_SITE_OTHER): Payer: 59 | Admitting: Advanced Practice Midwife

## 2020-11-14 ENCOUNTER — Other Ambulatory Visit: Payer: Self-pay

## 2020-11-14 VITALS — BP 134/82 | HR 89 | Ht 59.0 in | Wt 170.0 lb

## 2020-11-14 DIAGNOSIS — Z01419 Encounter for gynecological examination (general) (routine) without abnormal findings: Secondary | ICD-10-CM | POA: Diagnosis not present

## 2020-11-14 DIAGNOSIS — Z30431 Encounter for routine checking of intrauterine contraceptive device: Secondary | ICD-10-CM | POA: Diagnosis not present

## 2020-11-14 DIAGNOSIS — Z975 Presence of (intrauterine) contraceptive device: Secondary | ICD-10-CM | POA: Insufficient documentation

## 2020-11-14 DIAGNOSIS — Z1231 Encounter for screening mammogram for malignant neoplasm of breast: Secondary | ICD-10-CM

## 2020-11-14 NOTE — Progress Notes (Signed)
Patient presents for Annual Exam today.  Last pap:07/26/19 WNL Contraception: IUD  LMP: no cycles w/ IUD  STD Screening: Declines  Family Hx of Breast Cancer: None Family Hx of ovarian Cancer: None   CC: None

## 2020-11-14 NOTE — Progress Notes (Signed)
GYNECOLOGY ANNUAL PREVENTATIVE CARE ENCOUNTER NOTE  History:     [name] is a 50 y.o. [id number] female here for a routine annual gynecologic exam.  Current complaints: none.  Patient scheduled appointment because she thought her [place] was due to be removed and replaced. Denies abnormal vaginal bleeding, discharge, pelvic pain, problems with intercourse or other gynecologic concerns.   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   Gynecologic History No LMP recorded. (Menstrual status: IUD). Contraception: IUD Last Pap: 07/26/2019. Result was normal with negative HPV Last Mammogram: 08/11/2019.  Result was abnormal, subsequent imaging advised, pt unaware   Obstetric History OB History  Gravida Para Term Preterm AB Living  _0 0 3    SAB IAB Ectopic Multiple Live Births  0 3 0        # Outcome Date GA Lbr Len/2nd Weight Sex Delivery Anes PTL Lv  4 IAB           3 IAB           2 IAB           1 Term             Past Medical History:  Diagnosis Date   Back pain    Chronic kidney disease    kidney stones   Diabetes mellitus without complication (HCC)    History of kidney stones    PONV (postoperative nausea and vomiting)     Past Surgical History:  Procedure Laterality Date   CHOLECYSTECTOMY, LAPAROSCOPIC     CYSTOSCOPY WITH URETEROSCOPY AND STENT PLACEMENT Bilateral 07/29/2012   Procedure: CYSTOSCOPY WITH BILATERAL URETEROSCOPY AND BILATERAL STENT PLACEMENT, BASKET EXTRACTION OF BILATERAL URETERAL STONES;  Surgeon: Alexis Frock, MD;  Location: [facility];  Service: Urology;  Laterality: Bilateral;   EXTRACORPOREAL SHOCK WAVE LITHOTRIPSY Right 08/12/2020   Procedure: RIGHT EXTRACORPOREAL SHOCK WAVE LITHOTRIPSY ([facility]);  Surgeon: Ceasar Mons, MD;  Location: [facility];  Service: Urology;  Laterality: Right;   HAND SURGERY  10/29/2020    Current Outpatient  Medications on File Prior to Visit  Medication Sig Dispense Refill   glucose blood test strip Check your sugar in the morning before you eat breakfast, and one hour after a meal. 100 each 2   glucose monitoring kit (FREESTYLE) monitoring kit 1 each by Does not apply route daily. Check glucose once in the morning before breakfast and 1 hour after a meal 1 each 0   metFORMIN (GLUCOPHAGE) 500 MG tablet Take 1 tablet (500 mg total) by mouth daily with breakfast. 180 tablet 1   valsartan-hydrochlorothiazide (DIOVAN-HCT) 160-12.5 MG tablet Take 1 tablet by mouth daily.     betamethasone valerate ointment (VALISONE) 0.1 % Apply 1 application topically 2 (two) times daily. (Patient not taking: Reported on 11/14/2020) 30 g 0   ibuprofen (ADVIL) 600 MG tablet Take 1 tablet (600 mg total) by mouth every 6 (six) hours as needed. (Patient not taking: Reported on 11/14/2020) 30 tablet 0   levonorgestrel (MIRENA) 20 MCG/24HR IUD 1 each by Intrauterine route once.     metFORMIN (GLUCOPHAGE) 500 MG tablet Take 1 tablet (500 mg total) by mouth 2 (two) times daily with a meal. 1 tab po in the a.m. for 1 week, 1 tab twice a day 60 tablet 0   metroNIDAZOLE (FLAGYL) 500 MG tablet Take 1 tablet (500 mg total) by mouth 2 (two)  times daily. (Patient not taking: Reported on 11/14/2020) 14 tablet 0   naproxen (NAPROSYN) 500 MG tablet Take 1 tablet (500 mg total) by mouth 2 (two) times daily. (Patient not taking: Reported on 11/14/2020) 20 tablet 0   ondansetron (ZOFRAN ODT) 4 MG disintegrating tablet Take 1 tablet (4 mg total) by mouth every 8 (eight) hours as needed for nausea or vomiting. (Patient not taking: Reported on 11/14/2020) 30 tablet 1   oxyCODONE (OXY IR/ROXICODONE) 5 MG immediate release tablet Take 1 tablet (5 mg total) by mouth every 6 (six) hours as needed for severe pain. (Patient not taking: Reported on 11/14/2020) 8 tablet 0   phenazopyridine (PYRIDIUM) 200 MG tablet Take 1 tablet (200 mg total) by mouth 3  (three) times daily as needed for pain. (Patient not taking: Reported on 11/14/2020) 6 tablet 0   [DISCONTINUED] famotidine (PEPCID) 40 MG tablet Take 1 tablet (40 mg total) by mouth daily. 30 tablet 3   [DISCONTINUED] levocetirizine (XYZAL) 5 MG tablet Take 1 tablet (5 mg total) by mouth every evening. 30 tablet 3   [DISCONTINUED] SUMAtriptan (IMITREX) 25 MG tablet Take 1 tablet (25 mg total) by mouth every 2 (two) hours as needed for migraine. May repeat in 2 hours if headache persists or recurs. 10 tablet 2   No current facility-administered medications on file prior to visit.    Allergies  Allergen Reactions   Tramadol Hives, Itching and Rash   Hydrocodone-Acetaminophen Itching   Flomax [Tamsulosin Hcl] Palpitations    headache    Social History:  reports that she has never smoked. She has never used smokeless tobacco. She reports that she does not drink alcohol and does not use drugs.  Family History  Problem Relation Age of Onset   Other Neg Hx     The following portions of the patient's history were reviewed and updated as appropriate: allergies, current medications, past family history, past medical history, past social history, past surgical history and problem list.  Review of Systems Pertinent items noted in HPI and remainder of comprehensive ROS otherwise negative.  Physical Exam:  BP 134/82   Pulse 89   Ht _0  (1.499 m)   Wt 170 lb (77.1 kg)   BMI 34.34 kg/m  CONSTITUTIONAL: Well-developed, well-nourished female in no acute distress.  HENT:  Normocephalic, atraumatic, External right and left ear normal.  EYES: Conjunctivae and EOM are normal. Pupils are equal, round, and reactive to light. No scleral icterus.  NECK: Normal range of motion, supple, no masses.  Normal thyroid.  SKIN: Skin is warm and dry. No rash noted. Not diaphoretic. No erythema. No pallor. MUSCULOSKELETAL: Normal range of motion. No tenderness.  No cyanosis, clubbing, or edema. NEUROLOGIC:  Alert and oriented to person, place, and time. Normal reflexes, muscle tone coordination.  PSYCHIATRIC: Normal mood and affect. Normal behavior. Normal judgment and thought content. CARDIOVASCULAR: Normal heart rate noted, regular rhythm RESPIRATORY: Clear to auscultation bilaterally. Effort and breath sounds normal, no problems with respiration noted. BREASTS: Symmetric in size. No masses, tenderness, skin changes, nipple drainage, or lymphadenopathy bilaterally. Performed in the presence of a chaperone. ABDOMEN: Soft, no distention noted.  No tenderness, rebound or guarding.  PELVIC: Normal appearing external genitalia and urethral meatus; normal appearing vaginal mucosa and cervix.  No abnormal vaginal discharge noted.  IUD strings visible, length appropriate. Performed in the presence of a chaperone.   Assessment and Plan:    1. Well woman exam with routine gynecological exam - Routine care -  Has PCP  2. Encounter for screening mammogram for malignant neoplasm of breast - Reviewed findings, recommendation for increased surveillance - MM 3D SCREEN BREAST BILATERAL; Future  3. IUD (intrauterine device) in place - Liletta placed 02/01/2017  Mammogram scheduled Routine preventative health maintenance measures emphasized. Please refer to After Visit Summary for other counseling recommendations.   Total visit time: 30 min. Greater than 50% of visit spent in counseling and coordination of care.  Mallie Snooks, [place], MSN, CNM Certified Nurse Midwife, Product/process development scientist for [employer], [place]

## 2020-12-03 ENCOUNTER — Ambulatory Visit (INDEPENDENT_AMBULATORY_CARE_PROVIDER_SITE_OTHER): Payer: 59

## 2020-12-03 ENCOUNTER — Ambulatory Visit (INDEPENDENT_AMBULATORY_CARE_PROVIDER_SITE_OTHER): Payer: 59 | Admitting: Student

## 2020-12-03 ENCOUNTER — Other Ambulatory Visit: Payer: Self-pay

## 2020-12-03 ENCOUNTER — Encounter: Payer: Self-pay | Admitting: Student

## 2020-12-03 VITALS — BP 120/80 | HR 100 | Temp 98.2°F | Ht 59.0 in | Wt 175.0 lb

## 2020-12-03 DIAGNOSIS — R053 Chronic cough: Secondary | ICD-10-CM

## 2020-12-03 MED ORDER — BENZONATATE 200 MG PO CAPS: 200.0000 mg | ORAL_CAPSULE | Freq: Three times a day (TID) | ORAL | 2 refills | Status: DC | PRN

## 2020-12-03 MED ORDER — PANTOPRAZOLE SODIUM 40 MG PO TBEC: 40.0000 mg | DELAYED_RELEASE_TABLET | Freq: Every day | ORAL | 3 refills | Status: DC

## 2020-12-03 MED ORDER — HYDROCOD POLST-CPM POLST ER 10-8 MG/5ML PO SUER: ORAL | 0 refills | Status: DC

## 2020-12-03 NOTE — Progress Notes (Signed)
Synopsis: Referred for chronic cough by Lois Huxley, PA  Subjective:   PATIENT ID: [name] GENDER: female DOB: 12-17-1970, MRN: [id number]  Chief Complaint  Patient presents with   Consult    Pt states was referred by doctor to come get lungs checked have had a cough for 2 months   50yF with history of DM, kidney stones, referred for chronic cough.  Had covid-19 at the beginning of 2022. She wasn't hospitalized. No medications for it. She has had cough pretty much ever since then. Tussionex is the only thing that really seems to help with cough - takes it at night primarily. She doesn't have much in way of DOE. She has no CP. Her cough is only occasionally productive, mostly dry. No hemoptysis, fever. No asthma as a kid. No overtly symptomatic reflux. No postnasal drainage. No sinus congestion apart from when she catches a cold.  She has had an inhaler - unsure what it is or if it's been helpful. She doesn't think she's ever been prescribed steroids for cough.  Otherwise pertinent review of systems is negative.  No family history of lung disease  Works at [employer]. Deals with some powders. Wears mask. Symptoms are pretty much the same at work. Has worked there since 2014. She has 2 dogs. She has lived in [place] for 30 years. Has also lived in [place], [place], [place]. Never smoker. No vaping.      Past Medical History:  Diagnosis Date   Back pain    Chronic kidney disease    kidney stones   Diabetes mellitus without complication (HCC)    History of kidney stones    PONV (postoperative nausea and vomiting)      Family History  Problem Relation Age of Onset   Other Neg Hx      Past Surgical History:  Procedure Laterality Date   CHOLECYSTECTOMY, LAPAROSCOPIC     CYSTOSCOPY WITH URETEROSCOPY AND STENT PLACEMENT Bilateral 07/29/2012   Procedure: CYSTOSCOPY WITH BILATERAL URETEROSCOPY AND BILATERAL STENT PLACEMENT, BASKET EXTRACTION OF BILATERAL URETERAL STONES;   Surgeon: Alexis Frock, MD;  Location: [facility];  Service: Urology;  Laterality: Bilateral;   EXTRACORPOREAL SHOCK WAVE LITHOTRIPSY Right 08/12/2020   Procedure: RIGHT EXTRACORPOREAL SHOCK WAVE LITHOTRIPSY (ESWL);  Surgeon: Ceasar Mons, MD;  Location: [facility];  Service: Urology;  Laterality: Right;   HAND SURGERY  10/29/2020    Social History   Socioeconomic History   Marital status: Married    Spouse name: Not on file   Number of children: Not on file   Years of education: Not on file   Highest education level: Not on file  Occupational History   Not on file  Tobacco Use   Smoking status: Never   Smokeless tobacco: Never  Vaping Use   Vaping Use: Never used  Substance and Sexual Activity   Alcohol use: No   Drug use: No    Comment: takes Hydrocodone for R Flank pain for past 3 years;   Sexual activity: Not Currently    Birth control/protection: I.U.D.  Other Topics Concern   Not on file  Social History Narrative   ** Merged History Encounter **       Social Determinants of Health   Financial Resource Strain: Not on file  Food Insecurity: Not on file  Transportation Needs: Not on file  Physical Activity: Not on file  Stress: Not on file  Social Connections: Not on file  Intimate Partner  Violence: Not on file     Allergies  Allergen Reactions   Tramadol Hives, Itching and Rash   Hydrocodone-Acetaminophen Itching   Flomax [Tamsulosin Hcl] Palpitations    headache     Outpatient Medications Prior to Visit  Medication Sig Dispense Refill   atorvastatin (LIPITOR) 10 MG tablet Take 10 mg by mouth at bedtime.     chlorpheniramine-HYDROcodone (TUSSIONEX) 10-8 MG/5ML SUER SMARTSIG:5 Milliliter(s) By Mouth Every 12 Hours PRN     glucose blood test strip Check your sugar in the morning before you eat breakfast, and one hour after a meal. 100 each 2   glucose monitoring kit (FREESTYLE) monitoring kit 1 each by Does not apply route daily.  Check glucose once in the morning before breakfast and 1 hour after a meal 1 each 0   ibuprofen (ADVIL) 600 MG tablet Take 1 tablet (600 mg total) by mouth every 6 (six) hours as needed. 30 tablet 0   levonorgestrel (MIRENA) 20 MCG/24HR IUD 1 each by Intrauterine route once.     metFORMIN (GLUCOPHAGE) 500 MG tablet Take 1 tablet (500 mg total) by mouth daily with breakfast. 180 tablet 1   valsartan-hydrochlorothiazide (DIOVAN-HCT) 160-12.5 MG tablet Take 1 tablet by mouth daily.     betamethasone valerate ointment (VALISONE) 0.1 % Apply 1 application topically 2 (two) times daily. (Patient not taking: Reported on 11/14/2020) 30 g 0   metFORMIN (GLUCOPHAGE) 500 MG tablet Take 1 tablet (500 mg total) by mouth 2 (two) times daily with a meal. 1 tab po in the a.m. for 1 week, 1 tab twice a day 60 tablet 0   metroNIDAZOLE (FLAGYL) 500 MG tablet Take 1 tablet (500 mg total) by mouth 2 (two) times daily. (Patient not taking: Reported on 11/14/2020) 14 tablet 0   naproxen (NAPROSYN) 500 MG tablet Take 1 tablet (500 mg total) by mouth 2 (two) times daily. (Patient not taking: Reported on 11/14/2020) 20 tablet 0   ondansetron (ZOFRAN ODT) 4 MG disintegrating tablet Take 1 tablet (4 mg total) by mouth every 8 (eight) hours as needed for nausea or vomiting. (Patient not taking: Reported on 11/14/2020) 30 tablet 1   oxyCODONE (OXY IR/ROXICODONE) 5 MG immediate release tablet Take 1 tablet (5 mg total) by mouth every 6 (six) hours as needed for severe pain. (Patient not taking: Reported on 11/14/2020) 8 tablet 0   phenazopyridine (PYRIDIUM) 200 MG tablet Take 1 tablet (200 mg total) by mouth 3 (three) times daily as needed for pain. (Patient not taking: Reported on 11/14/2020) 6 tablet 0   No facility-administered medications prior to visit.       Objective:   Physical Exam:  General appearance: 50 y.o., female, NAD, conversant  Eyes: anicteric sclerae; PERRL, tracking appropriately HENT: NCAT; MMM Neck:  Trachea midline; no lymphadenopathy, no JVD Lungs: CTAB, no crackles, no wheeze, with normal respiratory effort CV: RRR, no murmur  Abdomen: Soft, non-tender; non-distended, BS present  Extremities: No peripheral edema, warm Skin: Normal turgor and texture; no rash Psych: Appropriate affect Neuro: Alert and oriented to person and place, no focal deficit     Vitals:   12/03/20 1544  BP: 120/80  Pulse: 100  Temp: 98.2 F (36.8 C)  TempSrc: Oral  SpO2: 98%  Weight: 175 lb (79.4 kg)  Height: '4\' 11"'  (1.499 m)   98% on RA BMI Readings from Last 3 Encounters:  12/03/20 35.35 kg/m  11/14/20 34.34 kg/m  08/12/20 32.72 kg/m   Wt Readings from Last 3  Encounters:  12/03/20 175 lb (79.4 kg)  11/14/20 170 lb (77.1 kg)  08/12/20 162 lb (73.5 kg)     CBC    Component Value Date/Time   WBC 16.2 (H) 08/05/2020 1130   RBC 5.09 08/05/2020 1130   HGB 14.4 08/05/2020 1130   HGB 13.4 07/20/2018 1011   HCT 43.6 08/05/2020 1130   HCT 40.1 07/20/2018 1011   PLT 355 08/05/2020 1130   PLT 329 07/20/2018 1011   MCV 85.7 08/05/2020 1130   MCV 86 07/20/2018 1011   MCH 28.3 08/05/2020 1130   MCHC 33.0 08/05/2020 1130   RDW 12.4 08/05/2020 1130   RDW 12.4 07/20/2018 1011   LYMPHSABS 0.7 07/30/2012 1832   MONOABS 0.2 07/30/2012 1832   EOSABS 0.0 07/30/2012 1832   BASOSABS 0.0 07/30/2012 1832    No remarkable eosinophilia  Chest Imaging:   CT stone protocol 08/05/20 lung bases reviewed by me unremarkable  CXR 04/2019 reviewed by me, unremarkable  CXR today reviewed by me unremarkable awaiting final read   Pulmonary Functions Testing Results: No flowsheet data found.      Assessment & Plan:   # Chronic cough  Asthma, GERD are chief considerations. PND unlikely. Covid-related ILD a less likely possibility.    Plan: - 8 week trial of protonix 40 mg 30 min before last meal she has before bedtime - PFTs next visit  RTC 8 weeks   Maryjane Hurter, MD [facility] 12/03/2020 4:05 PM

## 2020-12-03 NOTE — Patient Instructions (Signed)
-   PFTs and clinic appointment in 8 weeks - pantoprazole 40 mg daily 30 minutes before last meal you eat before bed - stick with this all the way till next visit

## 2020-12-24 ENCOUNTER — Ambulatory Visit: Payer: 59

## 2020-12-26 ENCOUNTER — Inpatient Hospital Stay: Admission: RE | Admit: 2020-12-26 | Payer: 59 | Source: Ambulatory Visit

## 2020-12-26 ENCOUNTER — Other Ambulatory Visit: Payer: Self-pay | Admitting: Advanced Practice Midwife

## 2020-12-26 DIAGNOSIS — N6310 Unspecified lump in the right breast, unspecified quadrant: Secondary | ICD-10-CM

## 2021-01-27 NOTE — Progress Notes (Deleted)
Synopsis: Referred for chronic cough by Donald Prose, MD  Subjective:   PATIENT ID: [name] GENDER: female DOB: Dec 31, 1970, MRN: [id number]  No chief complaint on file.  50yF with history of DM, kidney stones, referred for chronic cough.  Had covid-19 at the beginning of 2022. She wasn't hospitalized. No medications for it. She has had cough pretty much ever since then. Tussionex is the only thing that really seems to help with cough - takes it at night primarily. She doesn't have much in way of DOE. She has no CP. Her cough is only occasionally productive, mostly dry. No hemoptysis, fever. No asthma as a kid. No overtly symptomatic reflux. No postnasal drainage. No sinus congestion apart from when she catches a cold.  She has had an inhaler - unsure what it is or if it's been helpful. She doesn't think she's ever been prescribed steroids for cough.  No family history of lung disease  Works at [employer]. Deals with some powders. Wears mask. Symptoms are pretty much the same at work. Has worked there since 2014. She has 2 dogs. She has lived in [place] for 30 years. Has also lived in [place], [place], [place]. Never smoker. No vaping.   Interval HPI: Last seen by me 12/03/20 at which point planned 8 week trial of ppi, PFTs today.  Otherwise pertinent review of systems is negative.    Past Medical History:  Diagnosis Date   Back pain    Chronic kidney disease    kidney stones   Diabetes mellitus without complication (HCC)    History of kidney stones    PONV (postoperative nausea and vomiting)      Family History  Problem Relation Age of Onset   Other Neg Hx      Past Surgical History:  Procedure Laterality Date   CHOLECYSTECTOMY, LAPAROSCOPIC     CYSTOSCOPY WITH URETEROSCOPY AND STENT PLACEMENT Bilateral 07/29/2012   Procedure: CYSTOSCOPY WITH BILATERAL URETEROSCOPY AND BILATERAL STENT PLACEMENT, BASKET EXTRACTION OF BILATERAL URETERAL STONES;  Surgeon: Alexis Frock, MD;   Location: [facility];  Service: Urology;  Laterality: Bilateral;   EXTRACORPOREAL SHOCK WAVE LITHOTRIPSY Right 08/12/2020   Procedure: RIGHT EXTRACORPOREAL SHOCK WAVE LITHOTRIPSY (ESWL);  Surgeon: Ceasar Mons, MD;  Location: [facility];  Service: Urology;  Laterality: Right;   HAND SURGERY  10/29/2020    Social History   Socioeconomic History   Marital status: Married    Spouse name: Not on file   Number of children: Not on file   Years of education: Not on file   Highest education level: Not on file  Occupational History   Not on file  Tobacco Use   Smoking status: Never   Smokeless tobacco: Never  Vaping Use   Vaping Use: Never used  Substance and Sexual Activity   Alcohol use: No   Drug use: No    Comment: takes Hydrocodone for R Flank pain for past 3 years;   Sexual activity: Not Currently    Birth control/protection: I.U.D.  Other Topics Concern   Not on file  Social History Narrative   ** Merged History Encounter **       Social Determinants of Health   Financial Resource Strain: Not on file  Food Insecurity: Not on file  Transportation Needs: Not on file  Physical Activity: Not on file  Stress: Not on file  Social Connections: Not on file  Intimate Partner Violence: Not on file     Allergies  Allergen Reactions   Tramadol Hives, Itching and Rash   Hydrocodone-Acetaminophen Itching   Flomax [Tamsulosin Hcl] Palpitations    headache     Outpatient Medications Prior to Visit  Medication Sig Dispense Refill   atorvastatin (LIPITOR) 10 MG tablet Take 10 mg by mouth at bedtime.     benzonatate (TESSALON) 200 MG capsule Take 1 capsule (200 mg total) by mouth 3 (three) times daily as needed for cough. 30 capsule 2   betamethasone valerate ointment (VALISONE) 0.1 % Apply 1 application topically 2 (two) times daily. (Patient not taking: Reported on 11/14/2020) 30 g 0   chlorpheniramine-HYDROcodone (TUSSIONEX) 10-8 MG/5ML SUER SMARTSIG:5  Milliliter(s) By Mouth Every 12 Hours PRN 140 mL 0   glucose blood test strip Check your sugar in the morning before you eat breakfast, and one hour after a meal. 100 each 2   glucose monitoring kit (FREESTYLE) monitoring kit 1 each by Does not apply route daily. Check glucose once in the morning before breakfast and 1 hour after a meal 1 each 0   ibuprofen (ADVIL) 600 MG tablet Take 1 tablet (600 mg total) by mouth every 6 (six) hours as needed. 30 tablet 0   levonorgestrel (MIRENA) 20 MCG/24HR IUD 1 each by Intrauterine route once.     metFORMIN (GLUCOPHAGE) 500 MG tablet Take 1 tablet (500 mg total) by mouth 2 (two) times daily with a meal. 1 tab po in the a.m. for 1 week, 1 tab twice a day 60 tablet 0   metFORMIN (GLUCOPHAGE) 500 MG tablet Take 1 tablet (500 mg total) by mouth daily with breakfast. 180 tablet 1   metroNIDAZOLE (FLAGYL) 500 MG tablet Take 1 tablet (500 mg total) by mouth 2 (two) times daily. (Patient not taking: Reported on 11/14/2020) 14 tablet 0   naproxen (NAPROSYN) 500 MG tablet Take 1 tablet (500 mg total) by mouth 2 (two) times daily. (Patient not taking: Reported on 11/14/2020) 20 tablet 0   ondansetron (ZOFRAN ODT) 4 MG disintegrating tablet Take 1 tablet (4 mg total) by mouth every 8 (eight) hours as needed for nausea or vomiting. (Patient not taking: Reported on 11/14/2020) 30 tablet 1   oxyCODONE (OXY IR/ROXICODONE) 5 MG immediate release tablet Take 1 tablet (5 mg total) by mouth every 6 (six) hours as needed for severe pain. (Patient not taking: Reported on 11/14/2020) 8 tablet 0   pantoprazole (PROTONIX) 40 MG tablet Take 1 tablet (40 mg total) by mouth daily. 30 tablet 3   phenazopyridine (PYRIDIUM) 200 MG tablet Take 1 tablet (200 mg total) by mouth 3 (three) times daily as needed for pain. (Patient not taking: Reported on 11/14/2020) 6 tablet 0   valsartan-hydrochlorothiazide (DIOVAN-HCT) 160-12.5 MG tablet Take 1 tablet by mouth daily.     No facility-administered  medications prior to visit.       Objective:   Physical Exam:  General appearance: 51 y.o., female, NAD, conversant  Eyes: anicteric sclerae; PERRL, tracking appropriately HENT: NCAT; MMM Neck: Trachea midline; no lymphadenopathy, no JVD Lungs: CTAB, no crackles, no wheeze, with normal respiratory effort CV: RRR, no murmur  Abdomen: Soft, non-tender; non-distended, BS present  Extremities: No peripheral edema, warm Skin: Normal turgor and texture; no rash Psych: Appropriate affect Neuro: Alert and oriented to person and place, no focal deficit     There were no vitals filed for this visit.    on RA BMI Readings from Last 3 Encounters:  12/03/20 35.35 kg/m  11/14/20 34.34 kg/m  08/12/20  32.72 kg/m   Wt Readings from Last 3 Encounters:  12/03/20 175 lb (79.4 kg)  11/14/20 170 lb (77.1 kg)  08/12/20 162 lb (73.5 kg)     CBC    Component Value Date/Time   WBC 16.2 (H) 08/05/2020 1130   RBC 5.09 08/05/2020 1130   HGB 14.4 08/05/2020 1130   HGB 13.4 07/20/2018 1011   HCT 43.6 08/05/2020 1130   HCT 40.1 07/20/2018 1011   PLT 355 08/05/2020 1130   PLT 329 07/20/2018 1011   MCV 85.7 08/05/2020 1130   MCV 86 07/20/2018 1011   MCH 28.3 08/05/2020 1130   MCHC 33.0 08/05/2020 1130   RDW 12.4 08/05/2020 1130   RDW 12.4 07/20/2018 1011   LYMPHSABS 0.7 07/30/2012 1832   MONOABS 0.2 07/30/2012 1832   EOSABS 0.0 07/30/2012 1832   BASOSABS 0.0 07/30/2012 1832    No remarkable eosinophilia  Chest Imaging:   CT stone protocol 08/05/20 lung bases reviewed by me unremarkable  CXR 04/2019 reviewed by me, unremarkable  CXR today reviewed by me unremarkable awaiting final read   Pulmonary Functions Testing Results: No flowsheet data found.      Assessment & Plan:   # Chronic cough  Asthma, GERD are chief considerations. PND unlikely. Covid-related ILD a less likely possibility.    Plan: - 8 week trial of protonix 40 mg 30 min before last meal she has before  bedtime - PFTs next visit  RTC 8 weeks   Maryjane Hurter, MD [facility] 01/27/2021 5:05 PM

## 2021-01-29 ENCOUNTER — Ambulatory Visit: Payer: 59 | Admitting: Student

## 2021-02-04 ENCOUNTER — Ambulatory Visit (INDEPENDENT_AMBULATORY_CARE_PROVIDER_SITE_OTHER): Payer: 59 | Admitting: Obstetrics & Gynecology

## 2021-02-04 ENCOUNTER — Other Ambulatory Visit (HOSPITAL_COMMUNITY)
Admission: RE | Admit: 2021-02-04 | Discharge: 2021-02-04 | Disposition: A | Discharge status: Home / Self Care | Payer: 59 | Source: Ambulatory Visit | Attending: Obstetrics & Gynecology | Admitting: Obstetrics & Gynecology

## 2021-02-04 ENCOUNTER — Other Ambulatory Visit: Payer: Self-pay

## 2021-02-04 VITALS — BP 165/98 | HR 86 | Ht 59.0 in | Wt 173.2 lb

## 2021-02-04 DIAGNOSIS — N760 Acute vaginitis: Secondary | ICD-10-CM | POA: Diagnosis not present

## 2021-02-04 DIAGNOSIS — N898 Other specified noninflammatory disorders of vagina: Secondary | ICD-10-CM | POA: Insufficient documentation

## 2021-02-04 MED ORDER — NYSTATIN-TRIAMCINOLONE 100000-0.1 UNIT/GM-% EX CREA: 1.0000 "application " | TOPICAL_CREAM | Freq: Three times a day (TID) | CUTANEOUS | 2 refills | Status: DC

## 2021-02-04 NOTE — Progress Notes (Addendum)
° °  SUBJECTIVE:  51 y.o. female complains of vaginal discharge, itching, odor and irritation  for over a month(s). Denies abnormal vaginal bleeding or significant pelvic pain or fever. No UTI symptoms. Denies history of known exposure to STD.  Patient reported she has been using different body washes and sponges "scrubbing like I am cleaning a pot".  No LMP recorded. (Menstrual status: IUD).  OBJECTIVE:  She appears well, afebrile. Urine dipstick: not done.  ASSESSMENT:  Vaginal Discharge  Vaginal Odor Vaginal Itching Vaginal Irritation   PLAN:  Trichomonas, BVAG, CVAG probe sent to lab.- completed by Dr. Harolyn Rutherford Treatment: To be determined once lab results are received ROV prn if symptoms persist or worsen.   Derl Barrow, RN   Attending Addendum Patient was initially assessed and managed by nursing staff during this encounter. I have reviewed the chart and agree with the documentation and plan so far.  Patient was seen, history reviewed. She was examined with Latina Craver, RN as the chaperone Noted to have extensive vulvar erythema and excoriation from scratching, no lesions, no loss of architecture.Scant white discharge noted, testing sample obtained.  Discussed proper vulvar hygiene with patient. Emphasized avoidance of perfumed soaps, detergents, lotions and any type of douches; and avoiding abrasive scrubbing of the vulvar area. Recommended water and small amount of mild soap (sensitive skin soap if needed).  Mycolog cream prescribed for now, will follow up cervicovaginal testing and manage accordingly.  Of note, patient is up to date with her pap and mammogram.  Never had a colonoscopy, counseled about this and she declined referral to GI. I did give her information about Cologuard.  I spent 20 minutes dedicated to the care of this patient including pre-visit review of records, face to face time with the patient discussing her conditions and treatments and post  visit ordering of testing.   Verita Schneiders, MD, [place] [facility] [employer] [facility], [place]

## 2021-02-04 NOTE — Progress Notes (Signed)
Synopsis: Referred for chronic cough by Donald Prose, MD  Subjective:   PATIENT ID: [name] GENDER: female DOB: 04/20/1970, MRN: [id number]  Chief Complaint  Patient presents with   Follow-up    PFT's done. Cough is unchanged since the last visit. Tessalon pearles helped some but she ran out.    51yF with history of DM, kidney stones, referred for chronic cough.  Had covid-19 at the beginning of 2022. She wasn't hospitalized. No medications for it. She has had cough pretty much ever since then. Tussionex is the only thing that really seems to help with cough - takes it at night primarily. She doesn't have much in way of DOE. She has no CP. Her cough is only occasionally productive, mostly dry. No hemoptysis, fever. No asthma as a kid. No overtly symptomatic reflux. No postnasal drainage. No sinus congestion apart from when she catches a cold.  She has had an inhaler - unsure what it is or if it's been helpful. She doesn't think she's ever been prescribed steroids for cough.  Otherwise pertinent review of systems is negative.  No family history of lung disease  Works at [employer]. Deals with some powders. Wears mask. Symptoms are pretty much the same at work. Has worked there since 2014. She has 2 dogs. She has lived in [place] for 30 years. Has also lived in [place], [place], [place]. Never smoker. No vaping.   Interval HPI:  Last seen by me 12/03/20 with plan for 8 week trial ppi and then PFT. Stopped taking protonix after 1 week when she noticed headaches each morning. Cough is unchanged. Tessalon perles were helpful.    Past Medical History:  Diagnosis Date   Back pain    Chronic kidney disease    kidney stones   Diabetes mellitus without complication (HCC)    History of kidney stones    PONV (postoperative nausea and vomiting)      Family History  Problem Relation Age of Onset   Other Neg Hx      Past Surgical History:  Procedure Laterality Date   CHOLECYSTECTOMY,  LAPAROSCOPIC     CYSTOSCOPY WITH URETEROSCOPY AND STENT PLACEMENT Bilateral 07/29/2012   Procedure: CYSTOSCOPY WITH BILATERAL URETEROSCOPY AND BILATERAL STENT PLACEMENT, BASKET EXTRACTION OF BILATERAL URETERAL STONES;  Surgeon: Alexis Frock, MD;  Location: [facility];  Service: Urology;  Laterality: Bilateral;   EXTRACORPOREAL SHOCK WAVE LITHOTRIPSY Right 08/12/2020   Procedure: RIGHT EXTRACORPOREAL SHOCK WAVE LITHOTRIPSY (ESWL);  Surgeon: Ceasar Mons, MD;  Location[facility];  Service: Urology;  Laterality: Right;   HAND SURGERY  10/29/2020    Social History   Socioeconomic History   Marital status: Married    Spouse name: Not on file   Number of children: Not on file   Years of education: Not on file   Highest education level: Not on file  Occupational History   Not on file  Tobacco Use   Smoking status: Never   Smokeless tobacco: Never  Vaping Use   Vaping Use: Never used  Substance and Sexual Activity   Alcohol use: No   Drug use: No    Comment: takes Hydrocodone for R Flank pain for past 3 years;   Sexual activity: Not Currently    Birth control/protection: I.U.D.  Other Topics Concern   Not on file  Social History Narrative   ** Merged History Encounter **       Social Determinants of Radio broadcast assistant  Strain: Not on file  Food Insecurity: Not on file  Transportation Needs: Not on file  Physical Activity: Not on file  Stress: Not on file  Social Connections: Not on file  Intimate Partner Violence: Not on file     Allergies  Allergen Reactions   Tramadol Hives, Itching and Rash   Hydrocodone-Acetaminophen Itching   Flomax [Tamsulosin Hcl] Palpitations    headache     Outpatient Medications Prior to Visit  Medication Sig Dispense Refill   glucose blood test strip Check your sugar in the morning before you eat breakfast, and one hour after a meal. 100 each 2   glucose monitoring kit (FREESTYLE) monitoring kit 1 each  by Does not apply route daily. Check glucose once in the morning before breakfast and 1 hour after a meal 1 each 0   ibuprofen (ADVIL) 600 MG tablet Take 1 tablet (600 mg total) by mouth every 6 (six) hours as needed. 30 tablet 0   levonorgestrel (MIRENA) 20 MCG/24HR IUD 1 each by Intrauterine route once.     metFORMIN (GLUCOPHAGE) 500 MG tablet Take 1 tablet (500 mg total) by mouth daily with breakfast. 180 tablet 1   nystatin-triamcinolone (MYCOLOG II) cream Apply 1 application topically 3 (three) times daily. 60 g 2   valsartan-hydrochlorothiazide (DIOVAN-HCT) 160-12.5 MG tablet Take 1 tablet by mouth daily.     benzonatate (TESSALON) 200 MG capsule Take 1 capsule (200 mg total) by mouth 3 (three) times daily as needed for cough. 30 capsule 2   metFORMIN (GLUCOPHAGE) 500 MG tablet Take 1 tablet (500 mg total) by mouth 2 (two) times daily with a meal. 1 tab po in the a.m. for 1 week, 1 tab twice a day 60 tablet 0   atorvastatin (LIPITOR) 10 MG tablet Take 10 mg by mouth at bedtime.     betamethasone valerate ointment (VALISONE) 0.1 % Apply 1 application topically 2 (two) times daily. (Patient not taking: Reported on 11/14/2020) 30 g 0   chlorpheniramine-HYDROcodone (TUSSIONEX) 10-8 MG/5ML SUER SMARTSIG:5 Milliliter(s) By Mouth Every 12 Hours PRN 140 mL 0   oxyCODONE (OXY IR/ROXICODONE) 5 MG immediate release tablet Take 1 tablet (5 mg total) by mouth every 6 (six) hours as needed for severe pain. (Patient not taking: Reported on 11/14/2020) 8 tablet 0   No facility-administered medications prior to visit.       Objective:   Physical Exam:  General appearance: 51 y.o., female, NAD, conversant, female, NAD, conversant  Eyes: anicteric sclerae; PERRL, tracking appropriately HENT: NCAT; MMM Neck: Trachea midline; no lymphadenopathy, no JVD Lungs: CTAB, no crackles, no wheeze, with normal respiratory effort CV: RRR, no murmur  Abdomen: Soft, non-tender; non-distended, BS present  Extremities: No peripheral edema,  warm Skin: Normal turgor and texture; no rash Psych: Appropriate affect Neuro: Alert and oriented to person and place, no focal deficit      Vitals:   02/06/21 1032  BP: (!) 146/84  Pulse: 91  Temp: 98.6 F (37 C)  TempSrc: Oral  SpO2: 95%  Weight: 174 lb 3.2 oz (79 kg)  Height: 5' (1.524 m)    95% on RA BMI Readings from Last 3 Encounters:  02/06/21 34.02 kg/m  02/04/21 34.98 kg/m  12/03/20 35.35 kg/m   Wt Readings from Last 3 Encounters:  02/06/21 174 lb 3.2 oz (79 kg)  02/04/21 173 lb 3.2 oz (78.6 kg)  12/03/20 175 lb (79.4 kg)     CBC    Component Value Date/Time   WBC 16.2 (H) 08/05/2020  1130   RBC 5.09 08/05/2020 1130   HGB 14.4 08/05/2020 1130   HGB 13.4 07/20/2018 1011   HCT 43.6 08/05/2020 1130   HCT 40.1 07/20/2018 1011   PLT 355 08/05/2020 1130   PLT 329 07/20/2018 1011   MCV 85.7 08/05/2020 1130   MCV 86 07/20/2018 1011   MCH 28.3 08/05/2020 1130   MCHC 33.0 08/05/2020 1130   RDW 12.4 08/05/2020 1130   RDW 12.4 07/20/2018 1011   LYMPHSABS 0.7 07/30/2012 1832   MONOABS 0.2 07/30/2012 1832   EOSABS 0.0 07/30/2012 1832   BASOSABS 0.0 07/30/2012 1832    No remarkable eosinophilia  Chest Imaging:   CT stone protocol 08/05/20 lung bases reviewed by me unremarkable  CXR 04/2019 reviewed by me, unremarkable  CXR 12/03/20 reviewed by me unremarkable  Pulmonary Functions Testing Results: PFT Results Latest Ref Rng & Units 02/06/2021  FVC-Pre L 2.09  FVC-Predicted Pre % 76  FVC-Post L 2.12  FVC-Predicted Post % 77  Pre FEV1/FVC % % 85  Post FEV1/FCV % % 87  FEV1-Pre L 1.78  FEV1-Predicted Pre % 79  FEV1-Post L 1.84  DLCO uncorrected ml/min/mmHg 19.38  DLCO UNC% % 102  DLVA Predicted % 144  TLC L 3.29  TLC % Predicted % 73  RV % Predicted % 73   Reviewed by me with mild restriction     Assessment & Plan:   # Chronic cough  GERD chief consideration. PND unlikely. Asthma remains a less likely possibility.    # Restrictive  ventilatory defect: With low ERV, normal DLCO favor obesity as cause.  Plan: - 8 week trial of omeprazole 40 mg 30 min before last meal she has before bedtime - tessalon perles refilled - TLC measures for weight loss discussed - if no improvement in cough at next visit then methacholine challenge     Maryjane Hurter, MD [facility] 02/06/2021 3:19 PM

## 2021-02-04 NOTE — Progress Notes (Signed)
Attending Addendum Patient was initially assessed and managed by nursing staff during this encounter. I have reviewed the chart and agree with the documentation and plan so far.  Patient was seen, history reviewed. She was examined with Latina Craver, RN as the chaperone Noted to have extensive vulvar erythema and excoriation from scratching, no lesions, no loss of architecture.Scant white discharge noted, testing sample obtained.  Discussed proper vulvar hygiene with patient. Emphasized avoidance of perfumed soaps, detergents, lotions and any type of douches; and avoiding abrasive scrubbing of the vulvar area. Recommended water and small amount of mild soap (sensitive skin soap if needed).  Mycolog cream prescribed for now, will follow up cervicovaginal testing and manage accordingly.  Of note, patient is up to date with her pap and mammogram.  Never had a colonoscopy, counseled about this and she declined referral to GI. I did give her information about Cologuard.  I spent 20 minutes dedicated to the care of this patient including pre-visit review of records, face to face time with the patient discussing her conditions and treatments and post visit ordering of testing.   Debra Schneiders, MD 02/04/2021 9:45 AM

## 2021-02-05 LAB — CERVICOVAGINAL ANCILLARY ONLY
Bacterial Vaginitis (gardnerella): NEGATIVE
Candida Glabrata: NEGATIVE
Candida Vaginitis: POSITIVE — AB
Comment: NEGATIVE
Comment: NEGATIVE
Comment: NEGATIVE
Comment: NEGATIVE
Trichomonas: NEGATIVE

## 2021-02-06 ENCOUNTER — Ambulatory Visit: Payer: 59 | Admitting: Student

## 2021-02-06 ENCOUNTER — Other Ambulatory Visit: Payer: Self-pay

## 2021-02-06 ENCOUNTER — Other Ambulatory Visit: Payer: Self-pay | Admitting: *Deleted

## 2021-02-06 ENCOUNTER — Ambulatory Visit (INDEPENDENT_AMBULATORY_CARE_PROVIDER_SITE_OTHER): Payer: 59 | Admitting: Student

## 2021-02-06 ENCOUNTER — Encounter: Payer: Self-pay | Admitting: Obstetrics & Gynecology

## 2021-02-06 ENCOUNTER — Encounter: Payer: Self-pay | Admitting: Student

## 2021-02-06 VITALS — BP 146/84 | HR 91 | Temp 98.6°F | Ht 60.0 in | Wt 174.2 lb

## 2021-02-06 DIAGNOSIS — R942 Abnormal results of pulmonary function studies: Secondary | ICD-10-CM

## 2021-02-06 DIAGNOSIS — R053 Chronic cough: Secondary | ICD-10-CM

## 2021-02-06 LAB — PULMONARY FUNCTION TEST
DL/VA % pred: 144 %
DL/VA: 6.12 ml/min/mmHg/L
DLCO unc % pred: 102 %
DLCO unc: 19.38 ml/min/mmHg
FEF 25-75 Post: 2.56 L/sec
FEF 25-75 Pre: 2.32 L/sec
FEF2575-%Change-Post: 10 %
FEF2575-%Pred-Post: 108 %
FEF2575-%Pred-Pre: 97 %
FEV1-%Change-Post: 3 %
FEV1-%Pred-Post: 81 %
FEV1-%Pred-Pre: 79 %
FEV1-Post: 1.84 L
FEV1-Pre: 1.78 L
FEV1FVC-%Change-Post: 1 %
FEV1FVC-%Pred-Pre: 103 %
FEV6-%Change-Post: 1 %
FEV6-Post: 2.12 L
FEV6-Pre: 2.09 L
FVC-%Change-Post: 1 %
FVC-%Pred-Post: 77 %
FVC-%Pred-Pre: 76 %
FVC-Post: 2.12 L
FVC-Pre: 2.09 L
Post FEV1/FVC ratio: 87 %
Post FEV6/FVC ratio: 100 %
Pre FEV1/FVC ratio: 85 %
Pre FEV6/FVC Ratio: 100 %
RV % pred: 73 %
RV: 1.17 L
TLC % pred: 73 %
TLC: 3.29 L

## 2021-02-06 MED ORDER — BENZONATATE 200 MG PO CAPS: 200.0000 mg | ORAL_CAPSULE | Freq: Three times a day (TID) | ORAL | 2 refills | Status: DC | PRN

## 2021-02-06 MED ORDER — OMEPRAZOLE MAGNESIUM 20 MG PO TBEC: 20.0000 mg | DELAYED_RELEASE_TABLET | Freq: Every day | ORAL | 2 refills | Status: DC

## 2021-02-06 MED ORDER — FLUCONAZOLE 150 MG PO TABS: 150.0000 mg | ORAL_TABLET | Freq: Once | ORAL | 3 refills | Status: AC

## 2021-02-06 NOTE — Patient Instructions (Addendum)
-   omeprazole 40 mg 30 minutes before dinner, including Ruth[employer] Gerald Stabs!! - send me a message if you don't tolerate this  - see you in 8 weeks!

## 2021-02-06 NOTE — Progress Notes (Signed)
PFT done today. 

## 2021-02-07 ENCOUNTER — Ambulatory Visit
Admission: RE | Admit: 2021-02-07 | Discharge: 2021-02-07 | Disposition: A | Discharge status: Home / Self Care | Payer: 59 | Source: Ambulatory Visit | Attending: Advanced Practice Midwife | Admitting: Advanced Practice Midwife

## 2021-02-07 DIAGNOSIS — N6310 Unspecified lump in the right breast, unspecified quadrant: Secondary | ICD-10-CM

## 2021-02-12 ENCOUNTER — Encounter: Payer: Self-pay | Admitting: Student

## 2021-02-13 NOTE — Telephone Encounter (Signed)
Dr. Verlee Monte, please advise on pts email. She is requesting medication to help her dry cough at night. Thank you!

## 2021-02-24 MED ORDER — HYDROCOD POLI-CHLORPHE POLI ER 10-8 MG/5ML PO SUER: 5.0000 mL | Freq: Two times a day (BID) | ORAL | 0 refills | Status: DC | PRN

## 2021-03-26 NOTE — Progress Notes (Signed)
? ?Synopsis: Referred for chronic cough by Donald Prose, MD ? ?Subjective:  ? ?PATIENT ID: [name] GENDER: female DOB: 1970/06/19, MRN: [id number] ? ?Chief Complaint  ?Patient presents with  ? Follow-up  ?  Cough had improved but had started to increase over the past 2-3 wks. Cough is non prod.   ? ?51yF with history of DM, kidney stones, referred for chronic cough. ? ?Had covid-19 at the beginning of 2022. She wasn't hospitalized. No medications for it. She has had cough pretty much ever since then. Tussionex is the only thing that really seems to help with cough - takes it at night primarily. She doesn't have much in way of DOE. She has no CP. Her cough is only occasionally productive, mostly dry. No hemoptysis, fever. No asthma as a kid. No overtly symptomatic reflux. No postnasal drainage. No sinus congestion apart from when she catches a cold. ? ?She has had an inhaler - unsure what it is or if it's been helpful. She doesn't think she's ever been prescribed steroids for cough. ? ?No family history of lung disease ? ?Works at [employer]. Deals with some powders. Wears mask. Symptoms are pretty much the same at work. Has worked there since 2014. She has 2 dogs. She has lived in [place] for 30 years. Has also lived in [place], [place], [place]. Never smoker. No vaping.  ? ?Interval HPI: ? ?Started 8 week trial of omeprazole 40 mg 30 min before last meal before bedtime at last visit. Her husband went to pick it up but lost it. So she never actually started it.  ? ?Cough had improved spontaneously since last visit until worsening again 2 weeks before this visit. No DOE. Some CP but only when she coughs.  ? ?Otherwise pertinent review of systems is negative. ? ? ?Past Medical History:  ?Diagnosis Date  ? Back pain   ? Chronic kidney disease   ? kidney stones  ? Diabetes mellitus without complication ([place])   ? History of kidney stones   ? PONV (postoperative nausea and vomiting)   ?  ? ?Family History  ?Problem Relation  Age of Onset  ? Other Neg Hx   ?  ? ?Past Surgical History:  ?Procedure Laterality Date  ? CHOLECYSTECTOMY, LAPAROSCOPIC    ? CYSTOSCOPY WITH URETEROSCOPY AND STENT PLACEMENT Bilateral 07/29/2012  ? Procedure: CYSTOSCOPY WITH BILATERAL URETEROSCOPY AND BILATERAL STENT PLACEMENT, BASKET EXTRACTION OF BILATERAL URETERAL STONES;  Surgeon: Alexis Frock, MD;  Location: [facility];  Service: Urology;  Laterality: Bilateral;  ? EXTRACORPOREAL SHOCK WAVE LITHOTRIPSY Right 08/12/2020  ? Procedure: RIGHT EXTRACORPOREAL SHOCK WAVE LITHOTRIPSY (ESWL);  Surgeon: Ceasar Mons, MD;  Location: [facility];  Service: Urology;  Laterality: Right;  ? HAND SURGERY  10/29/2020  ? ? ?Social History  ? ?Socioeconomic History  ? Marital status: Married  ?  Spouse name: Not on file  ? Number of children: Not on file  ? Years of education: Not on file  ? Highest education level: Not on file  ?Occupational History  ? Not on file  ?Tobacco Use  ? Smoking status: Never  ? Smokeless tobacco: Never  ?Vaping Use  ? Vaping Use: Never used  ?Substance and Sexual Activity  ? Alcohol use: No  ? Drug use: No  ?  Comment: takes Hydrocodone for R Flank pain for past 3 years;  ? Sexual activity: Not Currently  ?  Birth control/protection: I.U.D.  ?Other Topics Concern  ? Not on  file  ?Social History Narrative  ? ** Merged History Encounter **  ?    ? ?Social Determinants of Health  ? ?Financial Resource Strain: Not on file  ?Food Insecurity: Not on file  ?Transportation Needs: Not on file  ?Physical Activity: Not on file  ?Stress: Not on file  ?Social Connections: Not on file  ?Intimate Partner Violence: Not on file  ?  ? ?Allergies  ?Allergen Reactions  ? Tramadol Hives, Itching and Rash  ? Hydrocodone-Acetaminophen Itching  ? Flomax [Tamsulosin Hcl] Palpitations  ?  headache  ?  ? ?Outpatient Medications Prior to Visit  ?Medication Sig Dispense Refill  ? benzonatate (TESSALON) 200 MG capsule Take 1 capsule (200 mg total) by  mouth 3 (three) times daily as needed for cough. 30 capsule 2  ? glucose blood test strip Check your sugar in the morning before you eat breakfast, and one hour after a meal. 100 each 2  ? glucose monitoring kit (FREESTYLE) monitoring kit 1 each by Does not apply route daily. Check glucose once in the morning before breakfast and 1 hour after a meal 1 each 0  ? levonorgestrel (MIRENA) 20 MCG/24HR IUD 1 each by Intrauterine route once.    ? metFORMIN (GLUCOPHAGE) 500 MG tablet Take 1 tablet (500 mg total) by mouth daily with breakfast. 180 tablet 1  ? nystatin-triamcinolone (MYCOLOG II) cream Apply 1 application topically 3 (three) times daily. 60 g 2  ? metFORMIN (GLUCOPHAGE) 500 MG tablet Take 1 tablet (500 mg total) by mouth 2 (two) times daily with a meal. 1 tab po in the a.m. for 1 week, 1 tab twice a day 60 tablet 0  ? valsartan-hydrochlorothiazide (DIOVAN-HCT) 160-12.5 MG tablet Take 1 tablet by mouth daily. (Patient not taking: Reported on 03/27/2021)    ? chlorpheniramine-HYDROcodone (TUSSIONEX PENNKINETIC ER) 10-8 MG/5ML Take 5 mLs by mouth every 12 (twelve) hours as needed for cough. 115 mL 0  ? ibuprofen (ADVIL) 600 MG tablet Take 1 tablet (600 mg total) by mouth every 6 (six) hours as needed. 30 tablet 0  ? omeprazole (PRILOSEC OTC) 20 MG tablet Take 1 tablet (20 mg total) by mouth daily. 60 tablet 2  ? ?No facility-administered medications prior to visit.  ? ? ? ? ? ?Objective:  ? ?Physical Exam: ? ?General appearance: 51 y.o., female, NAD, conversant  ?Eyes: anicteric sclerae; PERRL, tracking appropriately ?HENT: NCAT; MMM ?Neck: Trachea midline; no lymphadenopathy, no JVD ?Lungs:CTAB, no crackles, no wheeze, with normal respiratory effort ?CV: RRR, no murmur  ?Abdomen: Soft, non-tender; non-distended, BS present  ?Extremities: No peripheral edema, warm ?Skin: Normal turgor and texture; no rash ?Psych: Appropriate affect ?Neuro: Alert and oriented to person and place, no focal deficit  ? ? ? ? ?Vitals:   ? 03/27/21 1012  ?BP: 118/84  ?Pulse: 77  ?Temp: 97.9 ?F (36.6 ?C)  ?TempSrc: Oral  ?SpO2: 97%  ?Weight: 169 lb (76.7 kg)  ?Height: '4\' 11"'  (1.499 m)  ? ? ? ?97% on RA ?BMI Readings from Last 3 Encounters:  ?03/27/21 34.13 kg/m?  ?02/06/21 34.02 kg/m?  ?02/04/21 34.98 kg/m?  ? ?Wt Readings from Last 3 Encounters:  ?03/27/21 169 lb (76.7 kg)  ?02/06/21 174 lb 3.2 oz (79 kg)  ?02/04/21 173 lb 3.2 oz (78.6 kg)  ? ? ? ?CBC ?   ?Component Value Date/Time  ? WBC 16.2 (H) 08/05/2020 1130  ? RBC 5.09 08/05/2020 1130  ? HGB 14.4 08/05/2020 1130  ? HGB 13.4 07/20/2018 1011  ?  HCT 43.6 08/05/2020 1130  ? HCT 40.1 07/20/2018 1011  ? PLT 355 08/05/2020 1130  ? PLT 329 07/20/2018 1011  ? MCV 85.7 08/05/2020 1130  ? MCV 86 07/20/2018 1011  ? MCH 28.3 08/05/2020 1130  ? MCHC 33.0 08/05/2020 1130  ? RDW 12.4 08/05/2020 1130  ? RDW 12.4 07/20/2018 1011  ? LYMPHSABS 0.7 07/30/2012 1832  ? MONOABS 0.2 07/30/2012 1832  ? EOSABS 0.0 07/30/2012 1832  ? BASOSABS 0.0 07/30/2012 1832  ? ? ?No remarkable eosinophilia ? ?Chest Imaging: ? ? ?CT stone protocol 08/05/20 lung bases reviewed by me unremarkable ? ?CXR 04/2019 reviewed by me, unremarkable ? ?CXR 12/03/20 reviewed by me unremarkable ? ?Pulmonary Functions Testing Results: ? ?  Latest Ref Rng & Units 02/06/2021  ?  8:53 AM  ?PFT Results  ?FVC-Pre L 2.09    ?FVC-Predicted Pre % 76    ?FVC-Post L 2.12    ?FVC-Predicted Post % 77    ?Pre FEV1/FVC % % 85    ?Post FEV1/FCV % % 87    ?FEV1-Pre L 1.78    ?FEV1-Predicted Pre % 79    ?FEV1-Post L 1.84    ?DLCO uncorrected ml/min/mmHg 19.38    ?DLCO UNC% % 102    ?DLVA Predicted % 144    ?TLC L 3.29    ?TLC % Predicted % 73    ?RV % Predicted % 73    ? ?Reviewed by me with mild restriction ? ?   ?Assessment & Plan:  ? ?# Chronic cough ?GERD chief consideration. PND unlikely. Asthma remains a less likely possibility.  ? ?# Restrictive ventilatory defect: ?With low ERV, normal DLCO favor obesity as cause. ? ?Plan: ?- 8 week trial of omeprazole 40 mg 30  min before last meal she has before bedtime ?- tessalon perles refilled ?- TLC measures for weight loss discussed ?- if no improvement in cough at next visit then methacholine challenge ? ? ? ? ?[employer]

## 2021-03-27 ENCOUNTER — Ambulatory Visit: Payer: 59 | Admitting: Student

## 2021-03-27 ENCOUNTER — Other Ambulatory Visit: Payer: Self-pay

## 2021-03-27 ENCOUNTER — Encounter: Payer: Self-pay | Admitting: Student

## 2021-03-27 VITALS — BP 118/84 | HR 77 | Temp 97.9°F | Ht 59.0 in | Wt 169.0 lb

## 2021-03-27 DIAGNOSIS — R053 Chronic cough: Secondary | ICD-10-CM

## 2021-03-27 DIAGNOSIS — R942 Abnormal results of pulmonary function studies: Secondary | ICD-10-CM

## 2021-03-27 MED ORDER — OMEPRAZOLE 40 MG PO CPDR: 40.0000 mg | DELAYED_RELEASE_CAPSULE | Freq: Every day | ORAL | 0 refills | Status: DC

## 2021-03-27 MED ORDER — HYDROCOD POLI-CHLORPHE POLI ER 10-8 MG/5ML PO SUER: 5.0000 mL | Freq: Two times a day (BID) | ORAL | 0 refills | Status: DC | PRN

## 2021-03-27 MED ORDER — BENZONATATE 200 MG PO CAPS: 200.0000 mg | ORAL_CAPSULE | Freq: Three times a day (TID) | ORAL | 2 refills | Status: DC | PRN

## 2021-03-27 NOTE — Patient Instructions (Signed)
-   omeprazole 40 mg 30 minutes before dinner ?- send me a message if you don't tolerate this  ?- see you in 8 weeks! ?

## 2021-05-22 ENCOUNTER — Ambulatory Visit: Payer: 59 | Admitting: Student

## 2021-06-12 ENCOUNTER — Other Ambulatory Visit: Payer: Self-pay | Admitting: Student

## 2021-06-12 NOTE — Telephone Encounter (Signed)
Dr. Verlee Monte, please advise on med refill.

## 2021-11-10 ENCOUNTER — Ambulatory Visit: Payer: Medicaid Other

## 2021-11-10 ENCOUNTER — Ambulatory Visit (INDEPENDENT_AMBULATORY_CARE_PROVIDER_SITE_OTHER): Payer: Medicaid Other

## 2021-11-10 ENCOUNTER — Other Ambulatory Visit (HOSPITAL_COMMUNITY)
Admission: RE | Admit: 2021-11-10 | Discharge: 2021-11-10 | Disposition: A | Discharge status: Home / Self Care | Payer: Medicaid Other | Source: Ambulatory Visit | Attending: Family Medicine | Admitting: Family Medicine

## 2021-11-10 VITALS — BP 141/91 | HR 106 | Wt 172.0 lb

## 2021-11-10 DIAGNOSIS — R300 Dysuria: Secondary | ICD-10-CM | POA: Diagnosis not present

## 2021-11-10 DIAGNOSIS — N898 Other specified noninflammatory disorders of vagina: Secondary | ICD-10-CM | POA: Insufficient documentation

## 2021-11-10 DIAGNOSIS — Z113 Encounter for screening for infections with a predominantly sexual mode of transmission: Secondary | ICD-10-CM | POA: Diagnosis not present

## 2021-11-10 LAB — POCT URINALYSIS DIPSTICK
Bilirubin, UA: NEGATIVE
Blood, UA: NEGATIVE
Glucose, UA: POSITIVE — AB
Ketones, UA: NEGATIVE
Leukocytes, UA: NEGATIVE
Nitrite, UA: NEGATIVE
Protein, UA: POSITIVE — AB
Spec Grav, UA: 1.01 (ref 1.010–1.025)
Urobilinogen, UA: 0.2 E.U./dL
pH, UA: 6.5 (ref 5.0–8.0)

## 2021-11-10 MED ORDER — TERCONAZOLE 0.4 % VA CREA
1.0000 | TOPICAL_CREAM | Freq: Every day | VAGINAL | 0 refills | Status: DC
Start: 2021-11-10 — End: 2022-01-15

## 2021-11-10 MED ORDER — FLUCONAZOLE 150 MG PO TABS
150.0000 mg | ORAL_TABLET | Freq: Once | ORAL | 3 refills | Status: AC
Start: 2021-11-10 — End: 2021-11-10

## 2021-11-10 NOTE — Progress Notes (Signed)
SUBJECTIVE:  51 y.o. female complains of vaginal itching  . Denies abnormal vaginal bleeding or significant pelvic pain or fever. No UTI symptoms. Denies history of known exposure to STD.  No LMP recorded. (Menstrual status: IUD).  OBJECTIVE:  She appears well, afebrile. Urine dipstick: positive for glucose.  ASSESSMENT:  Vaginal Discharge  Vaginal itching Vaginal Burning    PLAN:  GC, chlamydia, trichomonas, BVAG, CVAG probe sent to lab. Treatment: To be determined once lab results are received ROV prn if symptoms persist or worsen.

## 2021-11-11 LAB — CERVICOVAGINAL ANCILLARY ONLY
Bacterial Vaginitis (gardnerella): NEGATIVE
Candida Glabrata: NEGATIVE
Candida Vaginitis: POSITIVE — AB
Chlamydia: NEGATIVE
Comment: NEGATIVE
Comment: NEGATIVE
Comment: NEGATIVE
Comment: NEGATIVE
Comment: NEGATIVE
Comment: NORMAL
Neisseria Gonorrhea: NEGATIVE
Trichomonas: NEGATIVE

## 2021-11-12 LAB — URINE CULTURE

## 2021-11-13 MED ORDER — FLUCONAZOLE 150 MG PO TABS: 150.0000 mg | ORAL_TABLET | Freq: Once | ORAL | 3 refills | Status: AC

## 2021-11-13 NOTE — Addendum Note (Signed)
Addended by: Caryl Bis on: 11/13/2021 01:10 PM   Modules accepted: Orders

## 2021-11-21 ENCOUNTER — Other Ambulatory Visit (HOSPITAL_COMMUNITY)
Admission: RE | Admit: 2021-11-21 | Discharge: 2021-11-21 | Disposition: A | Discharge status: Home / Self Care | Payer: Medicaid Other | Source: Ambulatory Visit | Attending: Family Medicine | Admitting: Family Medicine

## 2021-11-21 ENCOUNTER — Ambulatory Visit: Payer: Medicaid Other | Admitting: Family Medicine

## 2021-11-21 ENCOUNTER — Encounter: Payer: Self-pay | Admitting: Family Medicine

## 2021-11-21 VITALS — BP 145/96 | HR 86 | Wt 175.0 lb

## 2021-11-21 DIAGNOSIS — Z01419 Encounter for gynecological examination (general) (routine) without abnormal findings: Secondary | ICD-10-CM | POA: Diagnosis not present

## 2021-11-21 DIAGNOSIS — N761 Subacute and chronic vaginitis: Secondary | ICD-10-CM

## 2021-11-21 DIAGNOSIS — R7303 Prediabetes: Secondary | ICD-10-CM | POA: Diagnosis not present

## 2021-11-21 DIAGNOSIS — N63 Unspecified lump in unspecified breast: Secondary | ICD-10-CM

## 2021-11-21 DIAGNOSIS — Z01411 Encounter for gynecological examination (general) (routine) with abnormal findings: Secondary | ICD-10-CM | POA: Diagnosis not present

## 2021-11-21 NOTE — Progress Notes (Signed)
AEX  Last Pap:07/26/19 WNL  STD screening: Declined Mammogram: 02/07/21  CC: Vaginal irritation.

## 2021-11-21 NOTE — Progress Notes (Signed)
GYNECOLOGY ANNUAL PREVENTATIVE CARE ENCOUNTER NOTE  Subjective:   [name] is a 51 y.o. G62P1030 female here for a routine annual gynecologic exam.  Current complaints: vaginal irritation, used cream and this improved sx but then they returned. .     Has IUD. Last bleeding was ~2 years ago. Placed in 2016-- OK until 2024  Denies abnormal vaginal bleeding, discharge, pelvic pain, problems with intercourse or other gynecologic concerns.    Gynecologic History No LMP recorded. (Menstrual status: IUD). Contraception: IUD Last Pap: 2021. Results were: NIL HPV neg-- if this pap is negative recommend spacing to 5 years Last mammogram: Never had mammogram.   Health Maintenance Due  Topic Date Due   COLONOSCOPY (Pts 45-51yr Insurance coverage will need to be confirmed)  Never done   Zoster Vaccines- Shingrix (1 of 2) Never done   COVID-19 Vaccine (4 - Pfizer series) 07/27/2020   INFLUENZA VACCINE  08/05/2021    The following portions of the patient's history were reviewed and updated as appropriate: allergies, current medications, past family history, past medical history, past social history, past surgical history and problem list.  Review of Systems Pertinent items are noted in HPI.   Objective:  BP (!) 145/96   Pulse 86   Wt 175 lb (79.4 kg)   BMI 35.35 kg/m  CONSTITUTIONAL: Well-developed, well-nourished female in no acute distress.  HENT:  Normocephalic, atraumatic, External right and left ear normal. Oropharynx is clear and moist EYES:  No scleral icterus.  NECK: Normal range of motion, supple, no masses.  Normal thyroid.  SKIN: Skin is warm and dry. No rash noted. Not diaphoretic. No erythema. No pallor. NEUROLOGIC: Alert and oriented to person, place, and time. Normal reflexes, muscle tone coordination. No cranial nerve deficit noted. PSYCHIATRIC: Normal mood and affect. Normal behavior. Normal judgment and thought content. CARDIOVASCULAR: Normal heart rate noted,  regular rhythm. 2+ distal pulses. RESPIRATORY: Effort and breath sounds normal, no problems with respiration noted. BREASTS: Symmetric in size. No  skin changes, nipple drainage, or lymphadenopathy. Right breast: 3x2cm mass in the upper outer quadrant about 1 cm from areola. Firm and slightly rubbery with some mobility.  ABDOMEN: Soft,  no distention noted.  No tenderness, rebound or guarding.  PELVIC: Normal appearing external genitalia; Mildly atrophic vaginal mucosa and cervix. No abnormal discharge noted.  Pap smear obtained.  Normal uterine size, no other palpable masses, no uterine or adnexal tenderness. Unable to see IUD strings, used cytobrush but unable to get strings to be visualized  MUSCULOSKELETAL: Normal range of motion.       Assessment and Plan:  1) Annual gynecologic examination with pap smear:  Will follow up results of pap smear and manage accordingly. STI screening desired No.  Routine preventative health maintenance measures emphasized. Reviewed perimenopausal symptoms and management.    1. Well woman exam with routine gynecological exam Reviewed HM Has PCP  Recommended CRC screening - Cytology - PAP  2. Chronic vaginitis (([phone] Patient with recent treatment for yeast Suspect atrophic vaginitis Discussed removing IUD today vs getting FPrentisstesting. Counseled about high likelihood that she has gone through menopause - Hemoglobin AL2G- Follicle stimulating hormone  3. Prediabetes [phone] No recent testing, patient cannot remember  This might contribute to her vaginitis if she has progressed to T2Dm with worsening glucose control - Hemoglobin A1c  4. Breast mass in female Concern mass in the right breast, could be cyst but is firm. Discussed finding with patient and made aware  of need for assessment - MM Digital Diagnostic Unilat R; Future - US BREAST LTD UNI RIGHT INC AXILLA; Future - MM 3D SCREEN BREAST BILATERAL; Future    Please refer to After Visit  Summary for other counseling recommendations.   Return in about 1 year (around 11/22/2022) for Yearly wellness exam.  Caren Macadam, MD, MPH, ABFM Attending Physician Center for [facility]

## 2021-11-22 LAB — FOLLICLE STIMULATING HORMONE: FSH: 54.6 m[IU]/mL

## 2021-11-22 LAB — HEMOGLOBIN A1C
Est. average glucose Bld gHb Est-mCnc: 283 mg/dL
Hgb A1c MFr Bld: 11.5 % — ABNORMAL HIGH (ref 4.8–5.6)

## 2021-11-24 LAB — CERVICOVAGINAL ANCILLARY ONLY
Bacterial Vaginitis (gardnerella): NEGATIVE
Candida Glabrata: NEGATIVE
Candida Vaginitis: NEGATIVE
Comment: NEGATIVE
Comment: NEGATIVE
Comment: NEGATIVE

## 2021-11-25 LAB — CYTOLOGY - PAP
Adequacy: ABSENT
Comment: NEGATIVE
Diagnosis: NEGATIVE
High risk HPV: NEGATIVE

## 2021-12-05 ENCOUNTER — Other Ambulatory Visit: Payer: Self-pay | Admitting: Obstetrics and Gynecology

## 2021-12-05 ENCOUNTER — Ambulatory Visit (INDEPENDENT_AMBULATORY_CARE_PROVIDER_SITE_OTHER): Payer: Medicaid Other | Admitting: Obstetrics and Gynecology

## 2021-12-05 ENCOUNTER — Encounter: Payer: Self-pay | Admitting: Obstetrics and Gynecology

## 2021-12-05 VITALS — BP 144/87 | HR 82 | Wt 176.0 lb

## 2021-12-05 DIAGNOSIS — N760 Acute vaginitis: Secondary | ICD-10-CM | POA: Diagnosis not present

## 2021-12-05 DIAGNOSIS — Z419 Encounter for procedure for purposes other than remedying health state, unspecified: Secondary | ICD-10-CM | POA: Diagnosis not present

## 2021-12-05 MED ORDER — NYSTATIN-TRIAMCINOLONE 100000-0.1 UNIT/GM-% EX OINT: 1.0000 | TOPICAL_OINTMENT | Freq: Two times a day (BID) | CUTANEOUS | 0 refills | Status: DC

## 2021-12-05 MED ORDER — FLUCONAZOLE 150 MG PO TABS: 150.0000 mg | ORAL_TABLET | Freq: Once | ORAL | 1 refills | Status: AC

## 2021-12-05 NOTE — Progress Notes (Signed)
51 yo returning for the evaluation of vulvovaginitis. Patient reports persistent vaginal discomfort since her last visit  Past Medical History:  Diagnosis Date   Back pain    Chronic kidney disease    kidney stones   Diabetes mellitus without complication (HCC)    History of kidney stones    PONV (postoperative nausea and vomiting)    Past Surgical History:  Procedure Laterality Date   CHOLECYSTECTOMY, LAPAROSCOPIC     CYSTOSCOPY WITH URETEROSCOPY AND STENT PLACEMENT Bilateral 07/29/2012   Procedure: CYSTOSCOPY WITH BILATERAL URETEROSCOPY AND BILATERAL STENT PLACEMENT, BASKET EXTRACTION OF BILATERAL URETERAL STONES;  Surgeon: Alexis Frock, MD;  Location: [facility];  Service: Urology;  Laterality: Bilateral;   EXTRACORPOREAL SHOCK WAVE LITHOTRIPSY Right 08/12/2020   Procedure: RIGHT EXTRACORPOREAL SHOCK WAVE LITHOTRIPSY (ESWL);  Surgeon: Ceasar Mons, MD;  Location: [facility];  Service: Urology;  Laterality: Right;   HAND SURGERY  10/29/2020   Family History  Problem Relation Age of Onset   Other Neg Hx    Social History   Tobacco Use   Smoking status: Never   Smokeless tobacco: Never  Vaping Use   Vaping Use: Never used  Substance Use Topics   Alcohol use: No   Drug use: No    Comment: takes Hydrocodone for R Flank pain for past 3 years;   ROS See pertinent in HPI. All other systems reviewed and non contributory Blood pressure (!) 144/87, pulse 82, weight 176 lb (79.8 kg). GENERAL: Well-developed, well-nourished female in no acute distress.  PELVIC: Normal external female genitalia with areas of excoriations and erythema concerning for topical yeast infection. Chaperone present during the pelvic exam EXTREMITIES: No cyanosis, clubbing, or edema, 2+ distal pulses.   A/P 51 yo with vulvovaginitis - Rx diflucan provided - Rx mycolog ointment provided - RTC in 2 weeks if no improvement

## 2021-12-05 NOTE — Progress Notes (Signed)
RGYN pt presents for problem visit c/o vaginal itching.  Pt seen 11/10/21, and 11/21/21 for same complaint.

## 2021-12-11 ENCOUNTER — Other Ambulatory Visit: Payer: 59

## 2022-01-05 DIAGNOSIS — Z419 Encounter for procedure for purposes other than remedying health state, unspecified: Secondary | ICD-10-CM | POA: Diagnosis not present

## 2022-01-15 ENCOUNTER — Encounter (HOSPITAL_COMMUNITY): Payer: Self-pay | Admitting: *Deleted

## 2022-01-15 ENCOUNTER — Ambulatory Visit (HOSPITAL_COMMUNITY)
Admission: EM | Admit: 2022-01-15 | Discharge: 2022-01-15 | Disposition: A | Discharge status: Home / Self Care | Payer: Medicaid Other | Attending: Urgent Care | Admitting: Urgent Care

## 2022-01-15 ENCOUNTER — Ambulatory Visit (INDEPENDENT_AMBULATORY_CARE_PROVIDER_SITE_OTHER): Payer: Medicaid Other

## 2022-01-15 DIAGNOSIS — E1165 Type 2 diabetes mellitus with hyperglycemia: Secondary | ICD-10-CM | POA: Diagnosis not present

## 2022-01-15 DIAGNOSIS — R109 Unspecified abdominal pain: Secondary | ICD-10-CM

## 2022-01-15 DIAGNOSIS — R8290 Unspecified abnormal findings in urine: Secondary | ICD-10-CM

## 2022-01-15 DIAGNOSIS — T148XXA Other injury of unspecified body region, initial encounter: Secondary | ICD-10-CM

## 2022-01-15 LAB — POCT URINALYSIS DIPSTICK, ED / UC
Bilirubin Urine: NEGATIVE
Glucose, UA: >=1000 mg/dL — AB
Ketones, ur: NEGATIVE mg/dL
Leukocytes,Ua: NEGATIVE
Nitrite: NEGATIVE
Protein, ur: 100 mg/dL — AB
Specific Gravity, Urine: 1.02 (ref 1.005–1.030)
Urobilinogen, UA: 0.2 mg/dL (ref 0.0–1.0)
pH: 6 (ref 5.0–8.0)

## 2022-01-15 LAB — CBG MONITORING, ED: Glucose-Capillary: 365 mg/dL — ABNORMAL HIGH (ref 70–99)

## 2022-01-15 MED ORDER — METFORMIN HCL 500 MG PO TABS: 1000.0000 mg | ORAL_TABLET | Freq: Two times a day (BID) | ORAL | 0 refills | Status: DC

## 2022-01-15 MED ORDER — FLUCONAZOLE 150 MG PO TABS: 150.0000 mg | ORAL_TABLET | Freq: Once | ORAL | 0 refills | Status: AC

## 2022-01-15 MED ORDER — ACETAMINOPHEN 500 MG PO TABS: 1000.0000 mg | ORAL_TABLET | Freq: Three times a day (TID) | ORAL | 0 refills | Status: DC | PRN

## 2022-01-15 MED ORDER — TIZANIDINE HCL 4 MG PO TABS: 4.0000 mg | ORAL_TABLET | Freq: Three times a day (TID) | ORAL | 0 refills | Status: DC | PRN

## 2022-01-15 NOTE — ED Triage Notes (Signed)
Pt states when she urinates she noticed a lot of bubbles in her urine starting Tuesday. She states she is DM and she is worried it maybe because she is eating too much protein. Monday she has some lower back pain when cleaning her daughters room but not sure if its related. She states she has a foul odor in urine.

## 2022-01-15 NOTE — Discharge Instructions (Signed)
Your urine shows no signs of infection. You have a lot of glucose in your urine likely due to your uncontrolled blood sugar. Please cut back on your carbohydrate intake in your fruit juices/fruits. Please read the attached handouts regarding dietary changes for blood sugar. Please start taking 2 tablets of metformin every morning and again every evening with breakfast and dinner.  For your back pain you can take 2 tablets of Tylenol every 8 hours as needed for mild to moderate pain. You may also take tizanidine a muscle relaxer every 8 hours as needed for back pain.  Take with caution as this may make you slightly drowsy.  Please establish care with a new PCP to further evaluate your blood sugar.  If you develop symptoms of yeast infection, I have called in 1 single Diflucan tablet for you.

## 2022-01-15 NOTE — ED Provider Notes (Signed)
[facility]    CSN: [id number] Arrival date & time: 01/15/22  0805      History   Chief Complaint Chief Complaint  Patient presents with   Back Pain    HPI [name] is a 52 y.o. female.   52 year old female presents today with concerns of back pain.  She states that on Monday she was cleaning her daughter's room and felt that she was having a right sided back pain extending from her right scapula down to her lower back.  Feels that it is reproducible to palpation, felt it was musculoskeletal in nature.  Reports this pain is improved today compared to Monday, but still not resolved.  She notes however over the past 1 to 2 days, she has also had some odor to her urine.  This morning she reports a significant amount of bubbles in her urine.  She denies dysuria or hematuria, no fever, but does have some right-sided flank pain.  Patient does admit to a history of kidney stones in the past.  Had lithotripsy in 2022. Patient is a diabetic, admits that she has not been checking her glucose.  Feels that her diet recently may be contributing to her symptoms.  Has been eating a lot of red meat.   Back Pain   Past Medical History:  Diagnosis Date   Back pain    Chronic kidney disease    kidney stones   Diabetes mellitus without complication ([place])    History of kidney stones    PONV (postoperative nausea and vomiting)     Patient Active Problem List   Diagnosis Date Noted   IUD (intrauterine device) in place 11/14/2020   Elevated blood pressure reading without diagnosis of hypertension 07/26/2019   Prediabetes 07/21/2018   Serum cholesterol elevated 07/21/2018   Pain in thoracic spine 06/22/2012   Lumbago 06/22/2012    Past Surgical History:  Procedure Laterality Date   CHOLECYSTECTOMY, LAPAROSCOPIC     CYSTOSCOPY WITH URETEROSCOPY AND STENT PLACEMENT Bilateral 07/29/2012   Procedure: CYSTOSCOPY WITH BILATERAL URETEROSCOPY AND BILATERAL STENT PLACEMENT, BASKET  EXTRACTION OF BILATERAL URETERAL STONES;  Surgeon: Alexis Frock, MD;  Location: [facility];  Service: Urology;  Laterality: Bilateral;   EXTRACORPOREAL SHOCK WAVE LITHOTRIPSY Right 08/12/2020   Procedure: RIGHT EXTRACORPOREAL SHOCK WAVE LITHOTRIPSY (ESWL);  Surgeon: Ceasar Mons, MD;  Location: [facility];  Service: Urology;  Laterality: Right;   HAND SURGERY  10/29/2020    OB History     Gravida  4   Para  1   Term  1   Preterm  0   AB  3   Living         SAB  0   IAB  3   Ectopic  0   Multiple      Live Births               Home Medications    Prior to Admission medications   Medication Sig Start Date End Date Taking? Authorizing Provider  acetaminophen (TYLENOL) 500 MG tablet Take 2 tablets (1,000 mg total) by mouth every 8 (eight) hours as needed for mild pain or moderate pain. 01/15/22  Yes Adylene Dlugosz L, PA  atorvastatin (LIPITOR) 10 MG tablet Take 10 mg by mouth at bedtime. 08/21/21  Yes [provider]  fluconazole (DIFLUCAN) 150 MG tablet Take 1 tablet (150 mg total) by mouth once for 1 dose. As needed for yeast infection 01/15/22 01/15/22 Yes Lun Muro,  Eli Adami L, PA  levonorgestrel (MIRENA) 20 MCG/24HR IUD 1 each by Intrauterine route once.   Yes [provider]  metFORMIN (GLUCOPHAGE) 500 MG tablet Take 2 tablets (1,000 mg total) by mouth 2 (two) times daily with a meal. 01/15/22 02/14/22 Yes Anisa Leanos L, PA  tiZANidine (ZANAFLEX) 4 MG tablet Take 1 tablet (4 mg total) by mouth every 8 (eight) hours as needed (back pain). Sedation precaution 01/15/22  Yes Kelsy Polack L, PA  valsartan-hydrochlorothiazide (DIOVAN-HCT) 160-12.5 MG tablet Take 1 tablet by mouth daily.   Yes [provider]  famotidine (PEPCID) 40 MG tablet Take 1 tablet (40 mg total) by mouth daily. 02/12/17 04/14/19  Tereasa Coop, PA-C  levocetirizine (XYZAL) 5 MG tablet Take 1 tablet (5 mg total) by mouth every evening. 02/12/17 04/14/19   Tereasa Coop, PA-C  SUMAtriptan (IMITREX) 25 MG tablet Take 1 tablet (25 mg total) by mouth every 2 (two) hours as needed for migraine. May repeat in 2 hours if headache persists or recurs. 07/22/18 04/14/19  Maximiano Coss, NP    Family History Family History  Problem Relation Age of Onset   Other Neg Hx     Social History Social History   Tobacco Use   Smoking status: Never   Smokeless tobacco: Never  Vaping Use   Vaping Use: Never used  Substance Use Topics   Alcohol use: No   Drug use: No     Allergies   Tramadol, Hydrocodone-acetaminophen, and Flomax [tamsulosin hcl]   Review of Systems Review of Systems  Genitourinary:        Urine odor  Musculoskeletal:  Positive for back pain.  As per HPI   Physical Exam Triage Vital Signs ED Triage Vitals  Enc Vitals Group     BP 01/15/22 0839 (!) 142/96     Pulse Rate 01/15/22 0839 98     Resp 01/15/22 0839 18     Temp 01/15/22 0839 98.7 F (37.1 C)     Temp Source 01/15/22 0839 Oral     SpO2 01/15/22 0839 96 %     Weight --      Height --      Head Circumference --      Peak Flow --      Pain Score 01/15/22 0837 0     Pain Loc --      Pain Edu? --      Excl. in [place]? --    No data found.  Updated Vital Signs BP (!) 142/96 (BP Location: Left Arm)   Pulse 98   Temp 98.7 F (37.1 C) (Oral)   Resp 18   SpO2 96%   Visual Acuity Right Eye Distance:   Left Eye Distance:   Bilateral Distance:    Right Eye Near:   Left Eye Near:    Bilateral Near:     Physical Exam Vitals and nursing note reviewed.  Constitutional:      General: She is not in acute distress.    Appearance: She is well-developed. She is obese. She is not ill-appearing, toxic-appearing or diaphoretic.  HENT:     Head: Normocephalic and atraumatic.     Mouth/Throat:     Mouth: Mucous membranes are moist.  Eyes:     Conjunctiva/sclera: Conjunctivae normal.  Cardiovascular:     Rate and Rhythm: Normal rate and regular rhythm.      Heart sounds: No murmur heard. Pulmonary:     Effort: Pulmonary effort is normal. No respiratory  distress.     Breath sounds: Normal breath sounds. No stridor. No wheezing, rhonchi or rales.  Chest:     Chest wall: No tenderness.  Abdominal:     General: Abdomen is flat. Bowel sounds are normal. There is no distension.     Palpations: Abdomen is soft. There is no mass.     Tenderness: There is no abdominal tenderness. There is right CVA tenderness. There is no left CVA tenderness, guarding or rebound.  Musculoskeletal:        General: No swelling.     Cervical back: Normal range of motion and neck supple. No rigidity or tenderness.  Skin:    General: Skin is warm and dry.     Capillary Refill: Capillary refill takes less than 2 seconds.     Findings: No bruising, erythema or rash.  Neurological:     General: No focal deficit present.     Mental Status: She is alert and oriented to person, place, and time.  Psychiatric:        Mood and Affect: Mood normal.      UC Treatments / Results  Labs (all labs ordered are listed, but only abnormal results are displayed) Labs Reviewed  CBG MONITORING, ED - Abnormal; Notable for the following components:      Result Value   Glucose-Capillary 365 (*)    All other components within normal limits  POCT URINALYSIS DIPSTICK, ED / UC - Abnormal; Notable for the following components:   Glucose, UA >=1000 (*)    Hgb urine dipstick TRACE (*)    Protein, ur 100 (*)    All other components within normal limits    EKG   Radiology DG Abd 1 View  Result Date: 01/15/2022 CLINICAL DATA:  Right flank pain. EXAM: ABDOMEN - 1 VIEW COMPARISON:  10/22/2020 FINDINGS: The bowel gas pattern is unremarkable. No findings for obstruction or perforation. The soft tissue shadows are maintained. Surgical clips in the right upper quadrant from a prior cholecystectomy. An IUD is noted in the left central pelvis. The bony structures are unremarkable. IMPRESSION:  Unremarkable abdominal radiograph. Electronically Signed   By: Marijo Sanes M.D.   On: 01/15/2022 09:29    Procedures Procedures (including critical care time)  Medications Ordered in UC Medications - No data to display  Initial Impression / Assessment and Plan / UC Course  I have reviewed the triage vital signs and the nursing notes.  Pertinent labs & imaging results that were available during my care of the patient were reviewed by me and considered in my medical decision making (see chart for details).     Muscle strain -the symptoms on the right side of your back are reproducible, therefore suggestive of a muscle strain from your recent cleaning.  Continue the Tylenol as needed, will add a muscle relaxer.  Moist heat may also be helpful.  Your urine does not indicate an infection, your x-ray does not show evidence of a kidney stone. Uncontrolled diabetes -suspect this to be a contributing factor to patient's symptoms.  We did discuss in depth her diet.  It is high in carbohydrates, primarily fruit juices and fruits.  She also admits to only taking one of her prescribed 3 times daily metformin's.  She does not have a PCP at current and has not been checking her sugars.  Therefore I will increase it to 2 tablets twice daily of the metformin to reach the max dose.  I also would like patient to  schedule follow-up with the PCP.  She should check her sugar at least once daily in the morning. Abnormal urine order -possibly due to the glucose urea.  No evidence of infection.  Patient denying dysuria or hematuria.  Increase hydration with water, monitor and return if no change.   Final Clinical Impressions(s) / UC Diagnoses   Final diagnoses:  Muscle strain  Uncontrolled type 2 diabetes mellitus with hyperglycemia ([place])  Abnormal urine odor     Discharge Instructions      Your urine shows no signs of infection. You have a lot of glucose in your urine likely due to your uncontrolled blood  sugar. Please cut back on your carbohydrate intake in your fruit juices/fruits. Please read the attached handouts regarding dietary changes for blood sugar. Please start taking 2 tablets of metformin every morning and again every evening with breakfast and dinner.  For your back pain you can take 2 tablets of Tylenol every 8 hours as needed for mild to moderate pain. You may also take tizanidine a muscle relaxer every 8 hours as needed for back pain.  Take with caution as this may make you slightly drowsy.  Please establish care with a new PCP to further evaluate your blood sugar.  If you develop symptoms of yeast infection, I have called in 1 single Diflucan tablet for you.     ED Prescriptions     Medication Sig Dispense Auth. Provider   metFORMIN (GLUCOPHAGE) 500 MG tablet Take 2 tablets (1,000 mg total) by mouth 2 (two) times daily with a meal. 120 tablet Baani Bober L, PA   acetaminophen (TYLENOL) 500 MG tablet Take 2 tablets (1,000 mg total) by mouth every 8 (eight) hours as needed for mild pain or moderate pain. 30 tablet Ritu Gagliardo L, PA   tiZANidine (ZANAFLEX) 4 MG tablet Take 1 tablet (4 mg total) by mouth every 8 (eight) hours as needed (back pain). Sedation precaution 30 tablet Alvetta Hidrogo L, PA   fluconazole (DIFLUCAN) 150 MG tablet Take 1 tablet (150 mg total) by mouth once for 1 dose. As needed for yeast infection 1 tablet Emmalee Solivan L, PA      PDMP not reviewed this encounter.   Chaney Malling, [place] 01/15/22 1019

## 2022-02-05 DIAGNOSIS — Z419 Encounter for procedure for purposes other than remedying health state, unspecified: Secondary | ICD-10-CM | POA: Diagnosis not present

## 2022-03-04 ENCOUNTER — Ambulatory Visit (INDEPENDENT_AMBULATORY_CARE_PROVIDER_SITE_OTHER): Payer: Medicaid Other | Admitting: Nurse Practitioner

## 2022-03-04 ENCOUNTER — Encounter: Payer: Self-pay | Admitting: Nurse Practitioner

## 2022-03-04 ENCOUNTER — Ambulatory Visit (INDEPENDENT_AMBULATORY_CARE_PROVIDER_SITE_OTHER)
Admission: RE | Admit: 2022-03-04 | Discharge: 2022-03-04 | Disposition: A | Discharge status: Home / Self Care | Payer: Medicaid Other | Source: Ambulatory Visit | Attending: Nurse Practitioner | Admitting: Nurse Practitioner

## 2022-03-04 VITALS — BP 130/88 | HR 107 | Temp 98.2°F | Resp 16 | Ht 59.0 in | Wt 174.0 lb

## 2022-03-04 DIAGNOSIS — E1159 Type 2 diabetes mellitus with other circulatory complications: Secondary | ICD-10-CM | POA: Diagnosis not present

## 2022-03-04 DIAGNOSIS — E1165 Type 2 diabetes mellitus with hyperglycemia: Secondary | ICD-10-CM

## 2022-03-04 DIAGNOSIS — R21 Rash and other nonspecific skin eruption: Secondary | ICD-10-CM | POA: Diagnosis not present

## 2022-03-04 DIAGNOSIS — G8929 Other chronic pain: Secondary | ICD-10-CM

## 2022-03-04 DIAGNOSIS — L989 Disorder of the skin and subcutaneous tissue, unspecified: Secondary | ICD-10-CM | POA: Insufficient documentation

## 2022-03-04 DIAGNOSIS — M545 Low back pain, unspecified: Secondary | ICD-10-CM

## 2022-03-04 DIAGNOSIS — I152 Hypertension secondary to endocrine disorders: Secondary | ICD-10-CM | POA: Diagnosis not present

## 2022-03-04 DIAGNOSIS — Z1211 Encounter for screening for malignant neoplasm of colon: Secondary | ICD-10-CM

## 2022-03-04 LAB — COMPREHENSIVE METABOLIC PANEL
ALT: 36 U/L — ABNORMAL HIGH (ref 0–35)
AST: 22 U/L (ref 0–37)
Albumin: 4.1 g/dL (ref 3.5–5.2)
Alkaline Phosphatase: 98 U/L (ref 39–117)
BUN: 17 mg/dL (ref 6–23)
CO2: 31 mEq/L (ref 19–32)
Calcium: 10.2 mg/dL (ref 8.4–10.5)
Chloride: 95 mEq/L — ABNORMAL LOW (ref 96–112)
Creatinine, Ser: 0.95 mg/dL (ref 0.40–1.20)
GFR: 69.1 mL/min (ref >60.00)
Glucose, Bld: 313 mg/dL — ABNORMAL HIGH (ref 70–99)
Potassium: 3.8 mEq/L (ref 3.5–5.1)
Sodium: 135 mEq/L (ref 135–145)
Total Bilirubin: 0.3 mg/dL (ref 0.2–1.2)
Total Protein: 7.8 g/dL (ref 6.0–8.3)

## 2022-03-04 LAB — POCT GLYCOSYLATED HEMOGLOBIN (HGB A1C): Hemoglobin A1C: 11.8 % — AB (ref 4.0–5.6)

## 2022-03-04 LAB — CBC
HCT: 41.2 % (ref 36.0–46.0)
Hemoglobin: 13.7 g/dL (ref 12.0–15.0)
MCHC: 33.2 g/dL (ref 30.0–36.0)
MCV: 82.2 fl (ref 78.0–100.0)
Platelets: 310 10*3/uL (ref 150.0–400.0)
RBC: 5 Mil/uL (ref 3.87–5.11)
RDW: 14.3 % (ref 11.5–15.5)
WBC: 9.9 10*3/uL (ref 4.0–10.5)

## 2022-03-04 LAB — MICROALBUMIN / CREATININE URINE RATIO
Creatinine,U: 51.5 mg/dL
Microalb Creat Ratio: 109 mg/g — ABNORMAL HIGH (ref 0.0–30.0)
Microalb, Ur: 56.2 mg/dL — ABNORMAL HIGH (ref 0.0–1.9)

## 2022-03-04 MED ORDER — GLIPIZIDE 5 MG PO TABS: 5.0000 mg | ORAL_TABLET | Freq: Two times a day (BID) | ORAL | 3 refills | Status: DC

## 2022-03-04 MED ORDER — VALSARTAN-HYDROCHLOROTHIAZIDE 160-12.5 MG PO TABS: 1.0000 | ORAL_TABLET | Freq: Every day | ORAL | 1 refills | Status: DC

## 2022-03-04 MED ORDER — METFORMIN HCL 500 MG PO TABS: ORAL_TABLET | ORAL | 3 refills | Status: DC

## 2022-03-04 NOTE — Progress Notes (Signed)
New Patient Office Visit  Subjective    Patient ID: [name], female    DOB: [birth year]  Age: 52 y.o. MRN: [id number]  CC:  Chief Complaint  Patient presents with   Establish Care    HPI [name] presents to establish care  DM2: patient had A1c checded at GYN and was  11.3. Currently on metformin '1000mg'$  BID States that she will check her sugar at home once a week. States that she has not checked this past week.  States that she will feel tired at times.  States that she does eat some starthces will have an ocss sodas States that she cannot tolerate the metformin '500mg'$  in the am and pm.  HTN: currenlty maintained on valsartan-hctz.  Does not check blood pressure at home.  Patient states he ran out of medication and request refill  Back pain: States that she fell at work approx 4-5 years ago  went through therapy. States that she did see a back specialist in the past. [facility]. States therapy did not help. States that she did do needling but that made the pain worse.  States that she cannot sit too long or stand too long States dull and sharp that is intermittent. No numbness tingling or weaknees in the legs. NO B&B   Right thumb: states that it hurts and swell and get stiff. States that she has gotten injections in it in the past. States that she went to [place] [facility].   Pap: 11/2021 Mammo: 02/2021 Colonoscopy: Ambulatory referral to [facility]  Tdap: 2021 PNA: pps 23 Flu: needs Shingles : Patient that she is gotten the first vaccine unsure if she gotten both     Outpatient Encounter Medications as of 03/04/2022  Medication Sig   acetaminophen (TYLENOL) 500 MG tablet Take 2 tablets (1,000 mg total) by mouth every 8 (eight) hours as needed for mild pain or moderate pain.   atorvastatin (LIPITOR) 10 MG tablet Take 10 mg by mouth at bedtime.   glipiZIDE (GLUCOTROL) 5 MG tablet Take 1 tablet (5 mg total) by mouth 2 (two) times daily before a meal.    levonorgestrel (MIRENA) 20 MCG/24HR IUD 1 each by Intrauterine route once.   tiZANidine (ZANAFLEX) 4 MG tablet Take 1 tablet (4 mg total) by mouth every 8 (eight) hours as needed (back pain). Sedation precaution   [DISCONTINUED] valsartan-hydrochlorothiazide (DIOVAN-HCT) 160-12.5 MG tablet Take 1 tablet by mouth daily.   metFORMIN (GLUCOPHAGE) 500 MG tablet Take 2 tablets (1,'000mg'$ ) in the am and 1 tablet ('500mg'$ ) in the pm   valsartan-hydrochlorothiazide (DIOVAN-HCT) 160-12.5 MG tablet Take 1 tablet by mouth daily.   [DISCONTINUED] famotidine (PEPCID) 40 MG tablet Take 1 tablet (40 mg total) by mouth daily.   [DISCONTINUED] levocetirizine (XYZAL) 5 MG tablet Take 1 tablet (5 mg total) by mouth every evening.   [DISCONTINUED] metFORMIN (GLUCOPHAGE) 500 MG tablet Take 2 tablets (1,000 mg total) by mouth 2 (two) times daily with a meal.   [DISCONTINUED] SUMAtriptan (IMITREX) 25 MG tablet Take 1 tablet (25 mg total) by mouth every 2 (two) hours as needed for migraine. May repeat in 2 hours if headache persists or recurs.   No facility-administered encounter medications on file as of 03/04/2022.    Past Medical History:  Diagnosis Date   Back pain    Chronic kidney disease    kidney stones   Diabetes mellitus without complication ([place])    History of kidney stones    PONV (postoperative  nausea and vomiting)     Past Surgical History:  Procedure Laterality Date   CHOLECYSTECTOMY, LAPAROSCOPIC     CYSTOSCOPY WITH URETEROSCOPY AND STENT PLACEMENT Bilateral 07/29/2012   Procedure: CYSTOSCOPY WITH BILATERAL URETEROSCOPY AND BILATERAL STENT PLACEMENT, BASKET EXTRACTION OF BILATERAL URETERAL STONES;  Surgeon: Alexis Frock, MD;  Location: [facility];  Service: Urology;  Laterality: Bilateral;   EXTRACORPOREAL SHOCK WAVE LITHOTRIPSY Right 08/12/2020   Procedure: RIGHT EXTRACORPOREAL SHOCK WAVE LITHOTRIPSY (ESWL);  Surgeon: Ceasar Mons, MD;  Location: [facility];  Service:  Urology;  Laterality: Right;   HAND SURGERY  10/29/2020    Family History  Problem Relation Age of Onset   Other Neg Hx     Social History   Socioeconomic History   Marital status: Married    Spouse name: Long   Number of children: 1   Years of education: Not on file   Highest education level: Not on file  Occupational History   Not on file  Tobacco Use   Smoking status: Never   Smokeless tobacco: Never  Vaping Use   Vaping Use: Never used  Substance and Sexual Activity   Alcohol use: No   Drug use: No   Sexual activity: Yes    Birth control/protection: I.U.D.  Other Topics Concern   Not on file  Social History Narrative   Retied      Lexine Baton (29) daughter   Rodman Pickle (11)      Hobbies: hiking, spend time with family. Has 2 dogs   Social Determinants of Radio broadcast assistant Strain: Not on file  Food Insecurity: Not on file  Transportation Needs: Not on file  Physical Activity: Not on file  Stress: Not on file  Social Connections: Not on file  Intimate Partner Violence: Not on file    Review of Systems  Constitutional:  Negative for chills and fever.  Respiratory:  Negative for shortness of breath.   Cardiovascular:  Negative for chest pain.  Gastrointestinal:  Negative for abdominal pain, constipation, diarrhea, nausea and vomiting.       Bm daily   Genitourinary:  Negative for dysuria and frequency.  Skin:  Positive for itching and rash.  Neurological:  Negative for headaches.  Psychiatric/Behavioral:  Negative for hallucinations and suicidal ideas.         Objective    BP 130/88   Pulse (!) 107   Temp 98.2 F (36.8 C)   Resp 16   Ht '4\' 11"'$  (1.499 m)   Wt 174 lb (78.9 kg)   SpO2 96%   BMI 35.14 kg/m   Physical Exam Vitals and nursing note reviewed.  Constitutional:      Appearance: Normal appearance.  Cardiovascular:     Rate and Rhythm: Normal rate and regular rhythm.     Heart sounds: Normal heart sounds.  Pulmonary:      Effort: Pulmonary effort is normal.     Breath sounds: Normal breath sounds.  Musculoskeletal:     Lumbar back: Bony tenderness present. No tenderness. Positive right straight leg raise test. Negative left straight leg raise test.     Right lower leg: No edema.     Left lower leg: No edema.  Skin:    General: Skin is warm.     Findings: Rash present.  Neurological:     General: No focal deficit present.     Mental Status: She is alert.     Comments: Bilateral upper and lower extremity strength 5/5  Diabetic Foot Form - Detailed   Diabetic Foot Exam - detailed Diabetic Foot exam was performed with the following findings: Yes 03/04/2022 10:37 AM  Is there swelling or and abnormal foot shape?: No Is there a claw toe deformity?: No Is there elevated skin temparature?: No Pulse Foot Exam completed.: Yes   Right posterior Tibialias: Present Left posterior Tibialias: Present   Right Dorsalis Pedis: Present Left Dorsalis Pedis: Present  Semmes-Weinstein Monofilament Test   Comments: Site 3 absent on right foot Site 3 diminished on left foot  All other sites intact bilaterally          Assessment & Plan:   Problem List Items Addressed This Visit       Cardiovascular and Mediastinum   Hypertension associated with diabetes ([place])    Patient blood pressure well-controlled with valsartan-hydrochlorothiazide.  Patient requested refill today.  Refill provided pending labs      Relevant Medications   metFORMIN (GLUCOPHAGE) 500 MG tablet   glipiZIDE (GLUCOTROL) 5 MG tablet   valsartan-hydrochlorothiazide (DIOVAN-HCT) 160-12.5 MG tablet     Endocrine   Uncontrolled type 2 diabetes mellitus with hyperglycemia ([place]) - Primary    Patient currently uncontrolled with A1c of 11.8%.  Patient cannot tolerate 2000 mg metformin daily.  States she tolerates by 1500 mg.  Did offer switch patient to extended release version she politely declined.  Will add on glipizide 5 mg twice daily  encourage patient to check her glucose daily more often if needed.  Update urine microalbuminuria test today.  Also performed detailed foot exam in office      Relevant Medications   metFORMIN (GLUCOPHAGE) 500 MG tablet   glipiZIDE (GLUCOTROL) 5 MG tablet   valsartan-hydrochlorothiazide (DIOVAN-HCT) 160-12.5 MG tablet   Other Relevant Orders   POCT glycosylated hemoglobin (Hb A1C) (Completed)   CBC   Comprehensive metabolic panel   Microalbumin / creatinine urine ratio     Musculoskeletal and Integument   Rash    Patient states she was evaluated by dermatologist and had a biopsy performed and it was a rare skin disorder.  Ambulatory referral placed for dermatologist.  See clinical photo      Relevant Orders   Ambulatory referral to Dermatology     Other   Lumbago    Patient has a longstanding history of chronic back pain.  States it is result of a fall at work.  States that therapy and "needling" she is unsure if she had epidural back injections or not.  Patient requesting handicap placard will give her a 67-monthtemporary 1.  Will obtain lumbar picture today pending result      Relevant Orders   DG Lumbar Spine Complete   Other Visit Diagnoses     Screening for colon cancer       Relevant Orders   Ambulatory referral to Gastroenterology       Return in about 3 months (around 06/02/2022) for DM recheck.   MRomilda Garret NP

## 2022-03-04 NOTE — Assessment & Plan Note (Signed)
Patient states she was evaluated by dermatologist and had a biopsy performed and it was a rare skin disorder.  Ambulatory referral placed for dermatologist.  See clinical photo

## 2022-03-04 NOTE — Assessment & Plan Note (Signed)
Patient blood pressure well-controlled with valsartan-hydrochlorothiazide.  Patient requested refill today.  Refill provided pending labs

## 2022-03-04 NOTE — Patient Instructions (Signed)
Nice to see you today I added on glipizide that you will take twice a day Check your sugars daily for me  I need to see you in 3 months, sooner if you need me

## 2022-03-04 NOTE — Assessment & Plan Note (Signed)
Patient has a longstanding history of chronic back pain.  States it is result of a fall at work.  States that therapy and "needling" she is unsure if she had epidural back injections or not.  Patient requesting handicap placard will give her a 84-monthtemporary 1.  Will obtain lumbar picture today pending result

## 2022-03-04 NOTE — Assessment & Plan Note (Signed)
Patient currently uncontrolled with A1c of 11.8%.  Patient cannot tolerate 2000 mg metformin daily.  States she tolerates by 1500 mg.  Did offer switch patient to extended release version she politely declined.  Will add on glipizide 5 mg twice daily encourage patient to check her glucose daily more often if needed.  Update urine microalbuminuria test today.  Also performed detailed foot exam in office

## 2022-03-06 ENCOUNTER — Encounter: Payer: Self-pay | Admitting: *Deleted

## 2022-03-06 DIAGNOSIS — Z419 Encounter for procedure for purposes other than remedying health state, unspecified: Secondary | ICD-10-CM | POA: Diagnosis not present

## 2022-03-10 ENCOUNTER — Encounter: Payer: Self-pay | Admitting: Gastroenterology

## 2022-03-20 ENCOUNTER — Telehealth: Payer: Self-pay | Admitting: Nurse Practitioner

## 2022-03-20 NOTE — Telephone Encounter (Signed)
Got a notice from [facility] that her PCP on her medicaid card is not me and it  needs to match for the referral to go through

## 2022-03-20 NOTE — Telephone Encounter (Signed)
Pt made aware to call customer service on her card or her social worker to get the provider name changed for the referral.

## 2022-03-20 NOTE — Telephone Encounter (Signed)
Called and spoke to pt. Advised her to contact customer service on her card or her social worker to get the provider name changed.

## 2022-03-20 NOTE — Telephone Encounter (Signed)
Patient wanted [name] to know that she is waiting for a appointment to dermatology. In the mean time she was advised to let him know the name of the medications that she is needing for her skin:  Betamethasone  Dipropionate

## 2022-03-24 ENCOUNTER — Encounter: Payer: Self-pay | Admitting: Nurse Practitioner

## 2022-03-24 ENCOUNTER — Ambulatory Visit (INDEPENDENT_AMBULATORY_CARE_PROVIDER_SITE_OTHER): Payer: Medicaid Other | Admitting: Nurse Practitioner

## 2022-03-24 VITALS — BP 134/82 | HR 94 | Temp 98.5°F | Resp 16 | Ht 59.0 in | Wt 176.1 lb

## 2022-03-24 DIAGNOSIS — G44209 Tension-type headache, unspecified, not intractable: Secondary | ICD-10-CM

## 2022-03-24 DIAGNOSIS — E1165 Type 2 diabetes mellitus with hyperglycemia: Secondary | ICD-10-CM

## 2022-03-24 DIAGNOSIS — I152 Hypertension secondary to endocrine disorders: Secondary | ICD-10-CM

## 2022-03-24 DIAGNOSIS — E1159 Type 2 diabetes mellitus with other circulatory complications: Secondary | ICD-10-CM

## 2022-03-24 MED ORDER — GLUCOSE BLOOD VI STRP: ORAL_STRIP | 12 refills | Status: DC

## 2022-03-24 MED ORDER — CYCLOBENZAPRINE HCL 5 MG PO TABS: 5.0000 mg | ORAL_TABLET | Freq: Three times a day (TID) | ORAL | 0 refills | Status: DC | PRN

## 2022-03-24 NOTE — Progress Notes (Signed)
Acute Office Visit  Subjective:     Patient ID: [name], female    DOB: July 27, 1970, 52 y.o.   MRN: [id number]  Chief Complaint  Patient presents with   Headache    X 7 days in the back of the head     Headache    Patient is in today for headache with a history of htn, DM2  Started last week Thursday or Friday States that she woke up with it. States that she is having some neck pain too States that when she came to [place] she was in [place] she got hit in the back of the head and would have headaches intermittent. This headache feels different.   State that the current pain is intermittent on the back of the head  States that she has tried tylenol with no relief States that she took advil this am. States that it helped some  The pain is described as a tight sensation   Review of Systems  Neurological:  Positive for headaches.        Objective:    BP 134/82   Pulse 94   Temp 98.5 F (36.9 C)   Resp 16   Ht 4\' 11"  (1.499 m)   Wt 176 lb 2 oz (79.9 kg)   SpO2 97%   BMI 35.57 kg/m    Physical Exam Vitals and nursing note reviewed.  Constitutional:      Appearance: Normal appearance.  Eyes:     Extraocular Movements: Extraocular movements intact.     Pupils: Pupils are equal, round, and reactive to light.  Cardiovascular:     Rate and Rhythm: Normal rate and regular rhythm.     Heart sounds: Normal heart sounds.  Pulmonary:     Effort: Pulmonary effort is normal.     Breath sounds: Normal breath sounds.  Neurological:     General: No focal deficit present.     Mental Status: She is alert.     Deep Tendon Reflexes:     Reflex Scores:      Bicep reflexes are 2+ on the right side and 2+ on the left side.      Patellar reflexes are 2+ on the right side and 2+ on the left side.    Comments: Bilateral upper and lower extremity strength 5/5     No results found for any visits on 03/24/22.      Assessment & Plan:   Problem List Items  Addressed This Visit       Cardiovascular and Mediastinum   Hypertension associated with diabetes ([place])    Blood pressures under good control with today's reading.  Continue taking valsartan-hydrochlorothiazide as prescribed.        Endocrine   Uncontrolled type 2 diabetes mellitus with hyperglycemia (HCC)   Relevant Medications   glucose blood test strip     Other   Tension headache - Primary    Will do Flexeril 5 mg 3 times daily as needed.  Sedation precautions reviewed.  She can continue using Tylenol and NSAIDs over-the-counter as needed as beneficial.  Neurological exam benign in office today      Relevant Medications   cyclobenzaprine (FLEXERIL) 5 MG tablet    Meds ordered this encounter  Medications   cyclobenzaprine (FLEXERIL) 5 MG tablet    Sig: Take 1 tablet (5 mg total) by mouth 3 (three) times daily as needed for muscle spasms.    Dispense:  30 tablet    Refill:  0    Order Specific Question:   Supervising Provider    Answer:   Loura Pardon A [[id number]]   glucose blood test strip    Sig: Daily strip use    Dispense:  100 each    Refill:  12    One touch    Order Specific Question:   Supervising Provider    Answer:   Loura Pardon A [[id number]]    Return if symptoms worsen or fail to improve, for As scheduled .  Romilda Garret, NP

## 2022-03-24 NOTE — Patient Instructions (Signed)
Nice to see you today I have sent in a muscle relaxer for you to use This can make you sleepy Follow up if no improvement

## 2022-03-24 NOTE — Assessment & Plan Note (Signed)
Blood pressures under good control with today's reading.  Continue taking valsartan-hydrochlorothiazide as prescribed.

## 2022-03-24 NOTE — Assessment & Plan Note (Signed)
Will do Flexeril 5 mg 3 times daily as needed.  Sedation precautions reviewed.  She can continue using Tylenol and NSAIDs over-the-counter as needed as beneficial.  Neurological exam benign in office today

## 2022-04-06 DIAGNOSIS — Z419 Encounter for procedure for purposes other than remedying health state, unspecified: Secondary | ICD-10-CM | POA: Diagnosis not present

## 2022-04-15 ENCOUNTER — Other Ambulatory Visit: Payer: Self-pay | Admitting: Nurse Practitioner

## 2022-04-15 ENCOUNTER — Ambulatory Visit (INDEPENDENT_AMBULATORY_CARE_PROVIDER_SITE_OTHER): Payer: Medicaid Other | Admitting: Nurse Practitioner

## 2022-04-15 ENCOUNTER — Encounter: Payer: Self-pay | Admitting: Nurse Practitioner

## 2022-04-15 VITALS — BP 112/70 | HR 93 | Temp 98.8°F | Resp 16 | Ht 59.0 in | Wt 176.0 lb

## 2022-04-15 DIAGNOSIS — E1165 Type 2 diabetes mellitus with hyperglycemia: Secondary | ICD-10-CM

## 2022-04-15 DIAGNOSIS — M545 Low back pain, unspecified: Secondary | ICD-10-CM | POA: Diagnosis not present

## 2022-04-15 DIAGNOSIS — G8929 Other chronic pain: Secondary | ICD-10-CM | POA: Diagnosis not present

## 2022-04-15 MED ORDER — OXYCODONE-ACETAMINOPHEN 5-325 MG PO TABS: 1.0000 | ORAL_TABLET | Freq: Three times a day (TID) | ORAL | 0 refills | Status: AC | PRN

## 2022-04-15 NOTE — Progress Notes (Signed)
Established Patient Office Visit  Subjective   Patient ID: [name], female    DOB: Nov 09, 1970  Age: 52 y.o. MRN: [id number]  Chief Complaint  Patient presents with   Back Pain      Chronic back pain: she fell at [place] approx 4-5 years ago. States that she went through therapy. Mentions that she also seen a back specialist in the past at [facility]. States that therapy did not help. Tried dry needling in the past.  States that sitting or standing too long or the pain will get worse. States that this past week has been rought. States that her new year just started. States that she plans to fly to [place]. States that sitting in the airplane. States no numbess or tingling or bowel/bladder involvement      Review of Systems  Constitutional:  Negative for chills and fever.  Respiratory:  Negative for shortness of breath.   Cardiovascular:  Negative for chest pain.  Genitourinary:        Negative B&B involvement   Musculoskeletal:  Positive for back pain.  Neurological:  Negative for headaches.      Objective:     BP 112/70   Pulse 93   Temp 98.8 F (37.1 C)   Resp 16   Ht 4\' 11"  (1.499 m)   Wt 176 lb (79.8 kg)   SpO2 98%   BMI 35.55 kg/m  BP Readings from Last 3 Encounters:  04/15/22 112/70  03/24/22 134/82  03/04/22 130/88   Wt Readings from Last 3 Encounters:  04/15/22 176 lb (79.8 kg)  03/24/22 176 lb 2 oz (79.9 kg)  03/04/22 174 lb (78.9 kg)      Physical Exam Vitals and nursing note reviewed.  Constitutional:      Appearance: Normal appearance.  Cardiovascular:     Rate and Rhythm: Normal rate and regular rhythm.     Heart sounds: Normal heart sounds.  Pulmonary:     Effort: Pulmonary effort is normal.     Breath sounds: Normal breath sounds.  Musculoskeletal:     Lumbar back: Tenderness and bony tenderness present. Negative right straight leg raise test and negative left straight leg raise test.     Right lower leg: No edema.     Left lower  leg: No edema.  Neurological:     Mental Status: She is alert.     Deep Tendon Reflexes:     Reflex Scores:      Patellar reflexes are 1+ on the right side and 1+ on the left side.     No results found for any visits on 04/15/22.    The ASCVD Risk score (Arnett DK, et al., 2019) failed to calculate for the following reasons:   Cannot find a previous HDL lab   Cannot find a previous total cholesterol lab    Assessment & Plan:   Problem List Items Addressed This Visit       Other   Lumbago - Primary    4 to 5-year history of chronic lumbar pain.  Patient states she is in physical therapy seen orthopedist and had dry needling.  Patient using cane in office.  She will be going on a flight and states sitting for extended period of time makes her back pain worse will write a short course of oxycodone that she can use for traveling purposes only.  Patient states she would like something for now deferred writing her any prescriptions we will refer her  to interventional pain management/PMR since she qualifies for injections      Relevant Medications   oxyCODONE-acetaminophen (PERCOCET/ROXICET) 5-325 MG tablet   Other Relevant Orders   Ambulatory referral to Pain Clinic    Return if symptoms worsen or fail to improve, for As schedueld .    Audria Nine, NP

## 2022-04-15 NOTE — Assessment & Plan Note (Signed)
4 to 5-year history of chronic lumbar pain.  Patient states she is in physical therapy seen orthopedist and had dry needling.  Patient using cane in office.  She will be going on a flight and states sitting for extended period of time makes her back pain worse will write a short course of oxycodone that she can use for traveling purposes only.  Patient states she would like something for now deferred writing her any prescriptions we will refer her to interventional pain management/PMR since she qualifies for injections

## 2022-04-15 NOTE — Patient Instructions (Signed)
Keep your appointment with me in May Use this medication for the flight only It can cause constipation and drowsiness.  Think about ozempic or mounjaro

## 2022-04-21 ENCOUNTER — Other Ambulatory Visit: Payer: Self-pay

## 2022-04-21 ENCOUNTER — Ambulatory Visit (AMBULATORY_SURGERY_CENTER): Payer: Medicaid Other

## 2022-04-21 VITALS — Ht 59.0 in | Wt 176.0 lb

## 2022-04-21 DIAGNOSIS — Z1211 Encounter for screening for malignant neoplasm of colon: Secondary | ICD-10-CM

## 2022-04-21 MED ORDER — PLENVU 140 G PO SOLR: 1.0000 | ORAL | Status: DC

## 2022-04-21 NOTE — Addendum Note (Signed)
Addended by: Jaquelyn Bitter on: 04/21/2022 11:07 AM   Modules accepted: Orders

## 2022-04-21 NOTE — Addendum Note (Signed)
Addended by: Jaquelyn Bitter on: 04/21/2022 11:22 AM   Modules accepted: Orders

## 2022-04-21 NOTE — Progress Notes (Signed)
No egg or soy allergy known to patient  No issues known to pt with past sedation with any surgeries or procedures Patient denies ever being told they had issues or difficulty with intubation  No FH of Malignant Hyperthermia Pt is not on diet pills Pt is not on  home 02  Pt is not on blood thinners  Pt denies issues with constipation  No A fib or A flutter Have any cardiac testing pending--no Pt instructed to use [email or web address] or GoodRx for a price reduction on prep   

## 2022-04-22 ENCOUNTER — Other Ambulatory Visit (HOSPITAL_COMMUNITY)
Admission: RE | Admit: 2022-04-22 | Discharge: 2022-04-22 | Disposition: A | Discharge status: Home / Self Care | Payer: Medicaid Other | Source: Ambulatory Visit | Attending: Obstetrics & Gynecology | Admitting: Obstetrics & Gynecology

## 2022-04-22 ENCOUNTER — Encounter: Payer: Self-pay | Admitting: Obstetrics & Gynecology

## 2022-04-22 ENCOUNTER — Ambulatory Visit: Payer: Medicaid Other | Admitting: Obstetrics & Gynecology

## 2022-04-22 VITALS — BP 136/85 | HR 98 | Wt 179.0 lb

## 2022-04-22 DIAGNOSIS — N760 Acute vaginitis: Secondary | ICD-10-CM | POA: Diagnosis not present

## 2022-04-22 DIAGNOSIS — R300 Dysuria: Secondary | ICD-10-CM | POA: Insufficient documentation

## 2022-04-22 LAB — POCT URINALYSIS DIPSTICK: Glucose, UA: POSITIVE — AB

## 2022-04-22 MED ORDER — FLUCONAZOLE 150 MG PO TABS: 150.0000 mg | ORAL_TABLET | ORAL | 3 refills | Status: DC

## 2022-04-22 MED ORDER — NYSTATIN 100000 UNIT/GM EX CREA: 1.0000 | TOPICAL_CREAM | Freq: Three times a day (TID) | CUTANEOUS | 1 refills | Status: DC

## 2022-04-22 NOTE — Progress Notes (Signed)
GYNECOLOGY OFFICE VISIT NOTE  History:   [name] is a 52 y.o. G4P1030with history of poorly controlled T2DM  (last A1C on 03/04/22 was 11.8) here today for evaluation of vulvar and vaginal irritation. Reports irritation with wiping, itching, and discomfort x 3-4 days. Also burning during urination.   She denies any abnormal vaginal discharge, bleeding, pelvic pain or other concerns.    Past Medical History:  Diagnosis Date   Back pain    Chronic kidney disease    kidney stones   History of kidney stones    PONV (postoperative nausea and vomiting)    Poorly controlled type 2 diabetes mellitus     Past Surgical History:  Procedure Laterality Date   CHOLECYSTECTOMY, LAPAROSCOPIC     CYSTOSCOPY WITH URETEROSCOPY AND STENT PLACEMENT Bilateral 07/29/2012   Procedure: CYSTOSCOPY WITH BILATERAL URETEROSCOPY AND BILATERAL STENT PLACEMENT, BASKET EXTRACTION OF BILATERAL URETERAL STONES;  Surgeon: Sebastian Ache, MD;  Location: [facility];  Service: Urology;  Laterality: Bilateral;   EXTRACORPOREAL SHOCK WAVE LITHOTRIPSY Right 08/12/2020   Procedure: RIGHT EXTRACORPOREAL SHOCK WAVE LITHOTRIPSY (ESWL);  Surgeon: Rene Paci, MD;  Location: [facility];  Service: Urology;  Laterality: Right;   HAND SURGERY  10/29/2020    The following portions of the patient's history were reviewed and updated as appropriate: allergies, current medications, past family history, past medical history, past social history, past surgical history and problem list.   Health Maintenance:  Normal pap and negative HRHPV on 11/21/2021.  Normal mammogram on 02/07/2021.   Review of Systems:  Pertinent items noted in HPI and remainder of comprehensive ROS otherwise negative.  Physical Exam:  BP 136/85   Pulse 98   Wt 179 lb (81.2 kg)   BMI 36.15 kg/m  CONSTITUTIONAL: Well-developed, well-nourished female in no acute distress.  HEENT:  Normocephalic, atraumatic. External right and left  ear normal. No scleral icterus.  NECK: Normal range of motion, supple, no masses noted on observation SKIN: No rash noted. Not diaphoretic. No erythema. No pallor. MUSCULOSKELETAL: Normal range of motion. No edema noted. NEUROLOGIC: Alert and oriented to person, place, and time. Normal muscle tone coordination. No cranial nerve deficit noted. PSYCHIATRIC: Normal mood and affect. Normal behavior. Normal judgment and thought content. CARDIOVASCULAR: Normal heart rate noted RESPIRATORY: Effort and breath sounds normal, no problems with respiration noted ABDOMEN: No masses noted. No other overt distention noted.   PELVIC: Normal appearing external genitalia with mild erythema noted around introitus; normal urethral meatus; Clumpy, white abnormal discharge noted, testing sample obtained. Performed in the presence of a chaperone  Labs and Imaging Results for orders placed or performed in visit on 04/22/22 (from the past 168 hour(s))  POCT Urinalysis Dipstick   Collection Time: 04/22/22  3:27 PM  Result Value Ref Range   Color, UA     Clarity, UA     Glucose, UA Positive (A) Negative   Bilirubin, UA     Ketones, UA     Spec Grav, UA     Blood, UA moderate    pH, UA     Protein, UA     Urobilinogen, UA     Nitrite, UA     Leukocytes, UA Moderate (2+) (A) Negative   Appearance     Odor     No results found.     Assessment and Plan:     1. Dysuria Likely due to vulvovaginitis, will follow up culture.  - POCT Urinalysis Dipstick reviewed, it is  equivocal, will follow up culture - Cervicovaginal ancillary only( [facility]) - Urine Culture  2. Vulvovaginitis Likely recurrent yeast infection, especially given her poorly controlled T2DM. Optimization of T2DM management recommended. Proper vulvar hygiene emphasized: discussed avoidance of perfumed soaps, detergents, lotions and any type of douches; in addition to wearing cotton underwear and no underwear at night.  Also recommended  cleaning front to back, voiding and cleaning up after intercourse.  Diflucan and Nystatin cream prescribed. - Cervicovaginal ancillary only( [place]) - fluconazole (DIFLUCAN) 150 MG tablet; Take 1 tablet (150 mg total) by mouth every 3 (three) days. For three doses  Dispense: 3 tablet; Refill: 3 - nystatin cream (MYCOSTATIN); Apply 1 Application topically 3 (three) times daily.  Dispense: 30 g; Refill: 1   Routine preventative health maintenance measures emphasized. Please refer to After Visit Summary for other counseling recommendations.   Return for any gynecologic concerns.    I spent 25 minutes dedicated to the care of this patient including pre-visit review of records, face to face time with the patient discussing her conditions and treatments and post visit orders.    Jaynie Collins, MD, FACOG Obstetrician & Gynecologist, [facility] for [employer], [facility]

## 2022-04-23 IMAGING — US US BREAST*R* LIMITED INC AXILLA
1 series · 13 of 23 positions shown · non-contrast
Comparison: Mammography 08/11/2019.

CLINICAL DATA: 51-year-old presenting with diffuse (nonfocal)
BILATERAL breast pain. She was a screening recall for possible RIGHT
breast masses in August 2019, and returns now for follow-up.

EXAM:
DIGITAL DIAGNOSTIC BILATERAL MAMMOGRAM WITH TOMOSYNTHESIS AND CAD;
ULTRASOUND RIGHT BREAST LIMITED
TECHNIQUE: Bilateral digital diagnostic mammography and breast tomosynthesis
was performed. The images were evaluated with computer-aided
detection.; Targeted ultrasound examination of the right breast was
performed

[Series 1: us breast*right* limited inc axilla · 0.09mm/px · 13 of 23 slices shown]
[im 1/23]
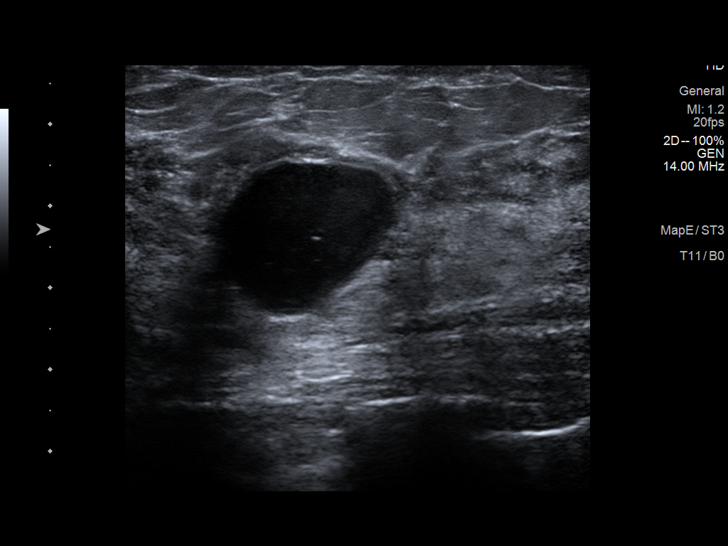
[im 3/23]
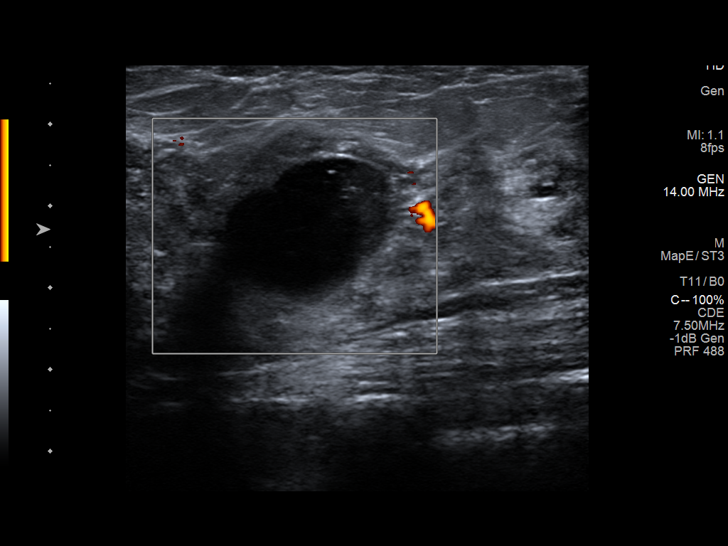
[im 5/23]
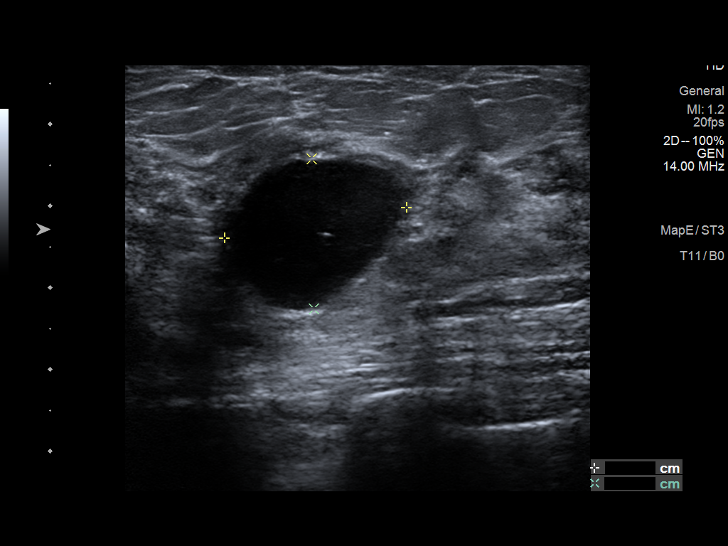
[im 7/23]
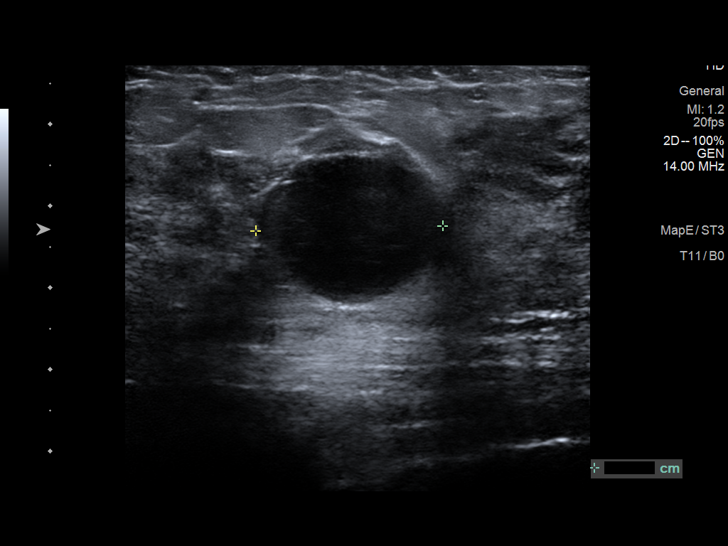
[im 8/23]
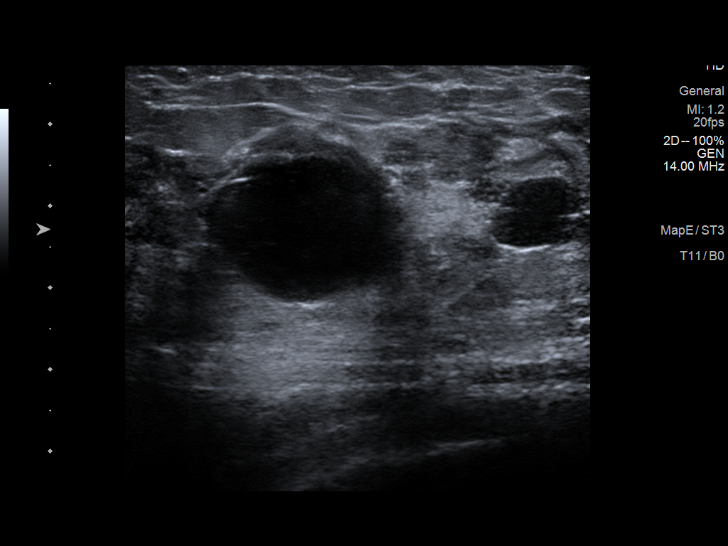
[im 10/23]
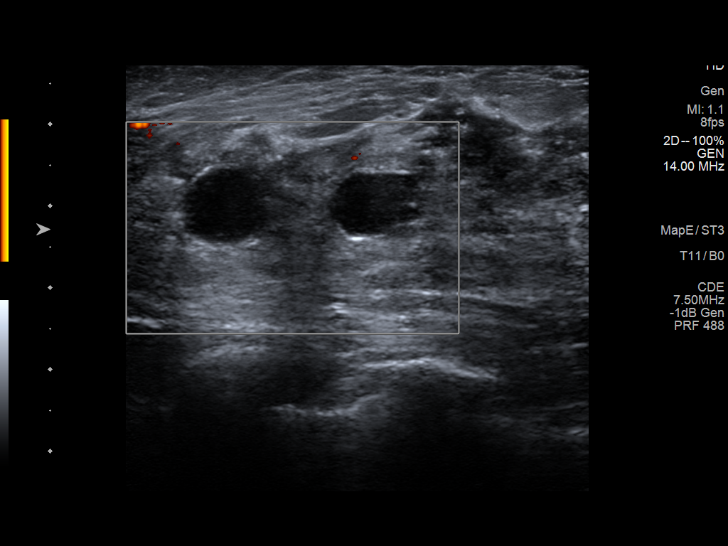
[im 12/23]
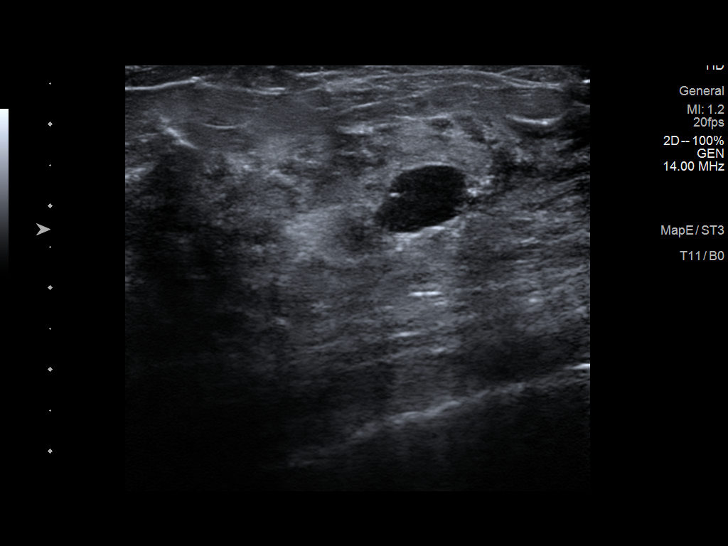
[im 14/23]
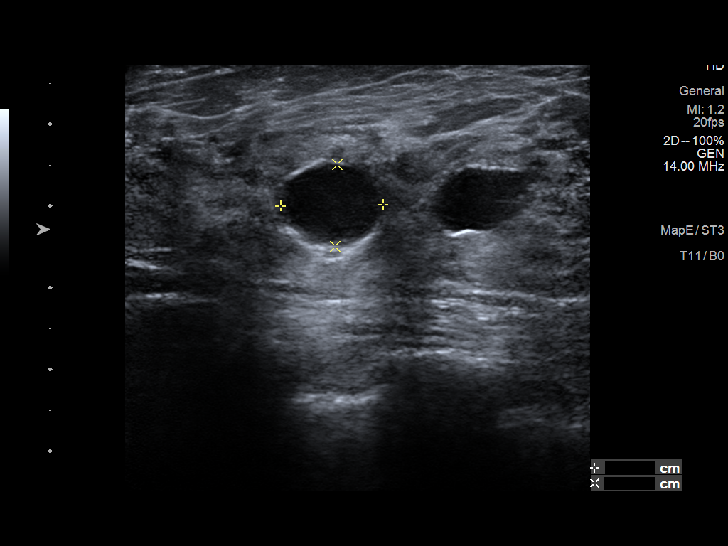
[im 16/23]
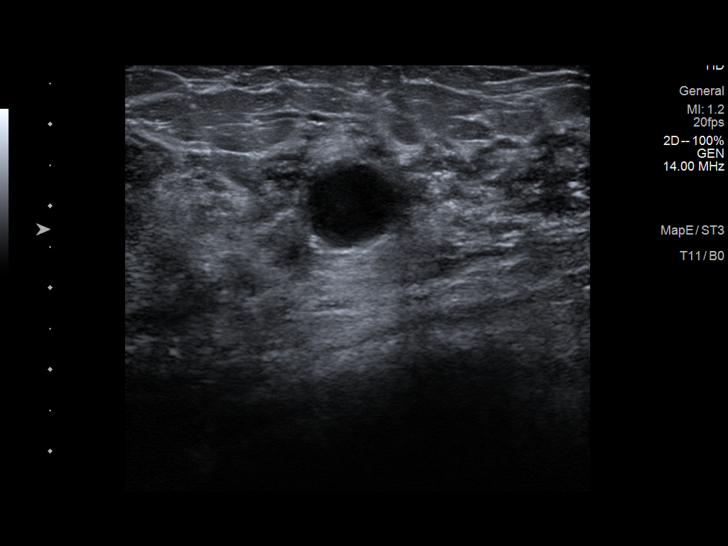
[im 17/23]
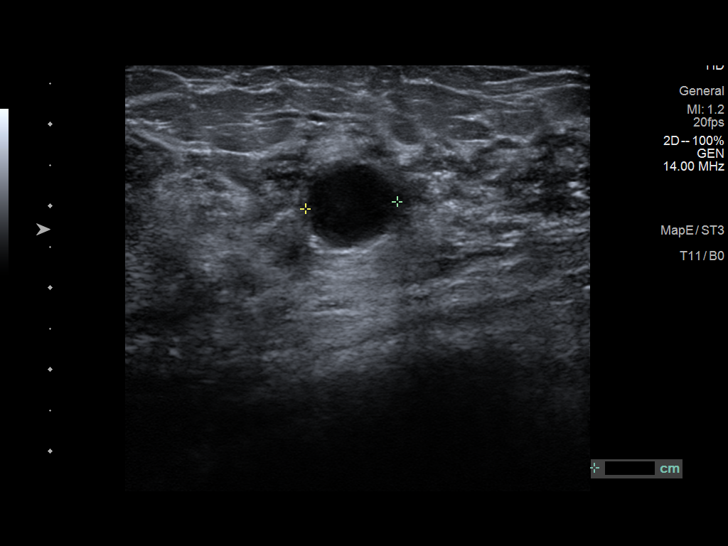
[im 19/23]
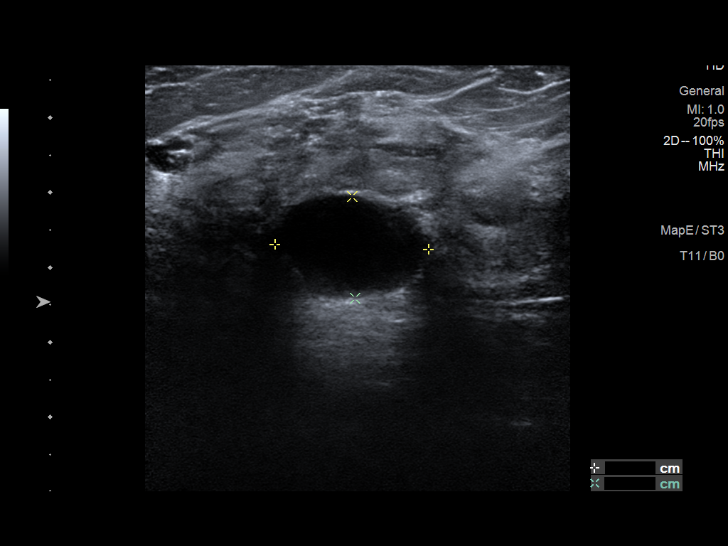
[im 21/23]
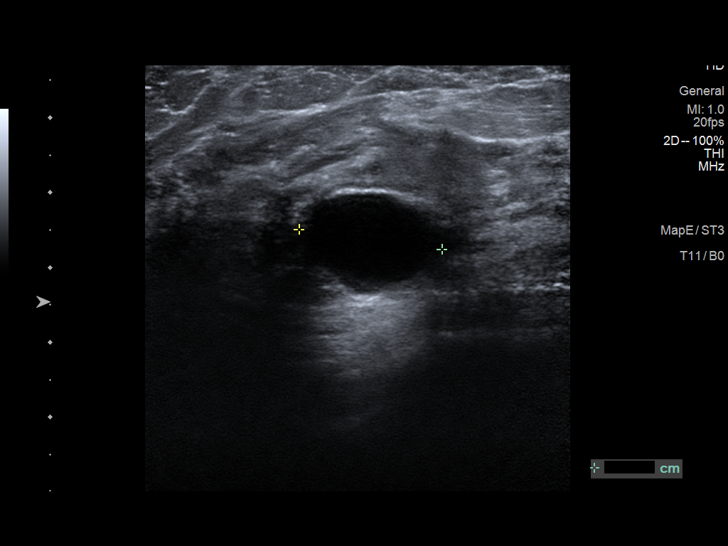
[im 23/23]
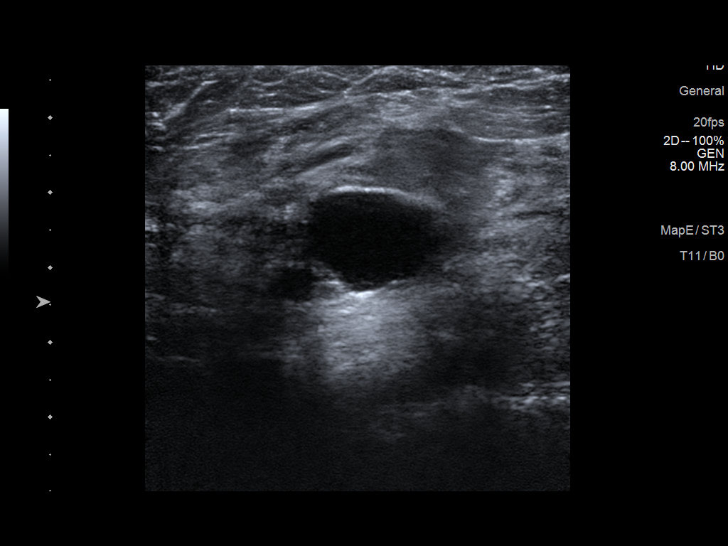

[13 of 23 positions shown; findings below may reference images not displayed]

No prior ultrasound.

ACR Breast Density Category d: The breast tissue is extremely dense,
which lowers the sensitivity of mammography.
FINDINGS: Full field CC and MLO views of both breasts were obtained.

RIGHT: Predominantly obscured isodense masses in the upper breast
without associated architectural distortion or suspicious
calcifications. If anything, these masses have decreased in size
since the August 2019 mammogram. No suspicious findings elsewhere.

Targeted ultrasound is performed, demonstrating multiple
circumscribed anechoic masses with internal echoes in the UPPER
OUTER QUADRANT indicating benign mildly complicated cysts. At the
9:30 o'clock position 3 cm from the nipple a cyst measures
approximately 2.1 x 1.4 x 1.9 cm. Adjacent cysts at the 11 o'clock
position 1 cm from the nipple measure approximately 1.3 x 1.0 x
cm and 1.2 x 0.8 x 1.1 cm. No suspicious solid mass or abnormal
acoustic shadowing is identified.

LEFT: No findings suspicious for malignancy.
IMPRESSION: 1. No mammographic or sonographic evidence of malignancy involving
the RIGHT breast.
2. No mammographic evidence of malignancy involving the LEFT breast.
3. Benign cysts in the UPPER OUTER QUADRANT of the RIGHT breast.

RECOMMENDATION:
Screening mammogram in one year.(Code:PL-W-JGE)

I have discussed the findings and recommendations with the patient.
Strategies for alleviating breast pain including decreasing caffeine
intake and vitamin-E supplementation were discussed with the
patient.

If applicable, a reminder letter will be sent to the patient
regarding the next appointment.

BI-RADS CATEGORY  2: Benign.

## 2022-04-23 IMAGING — MG DIGITAL DIAGNOSTIC BILAT W/ TOMO W/ CAD
8 series · 8 of 24 positions shown · non-contrast
Comparison: Mammography 08/11/2019.

CLINICAL DATA: 51-year-old presenting with diffuse (nonfocal)
BILATERAL breast pain. She was a screening recall for possible RIGHT
breast masses in August 2019, and returns now for follow-up.

EXAM:
DIGITAL DIAGNOSTIC BILATERAL MAMMOGRAM WITH TOMOSYNTHESIS AND CAD;
ULTRASOUND RIGHT BREAST LIMITED
TECHNIQUE: Bilateral digital diagnostic mammography and breast tomosynthesis
was performed. The images were evaluated with computer-aided
detection.; Targeted ultrasound examination of the right breast was
performed

[L CC synth-2D]
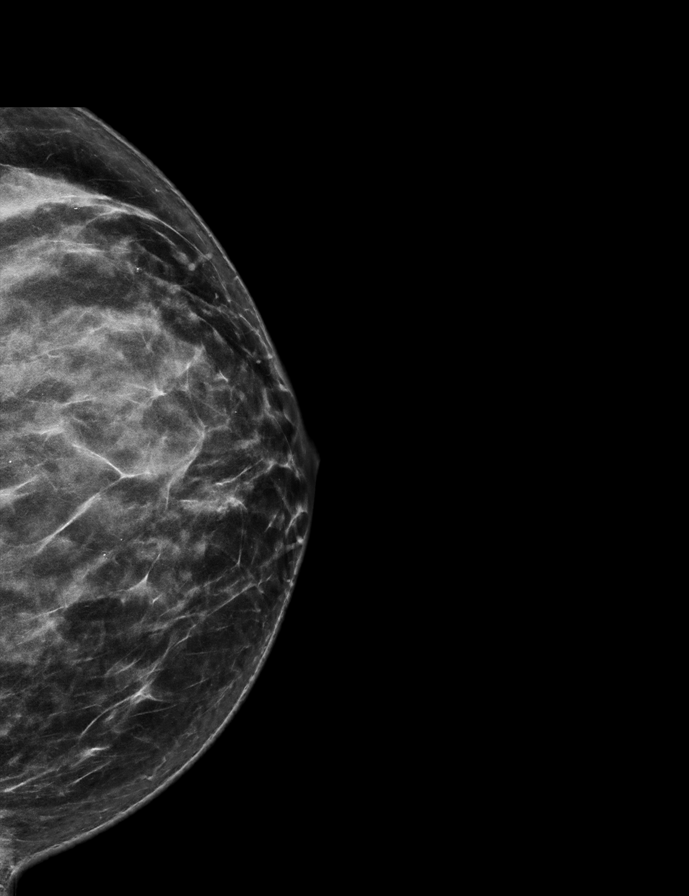

[R CC synth-2D]
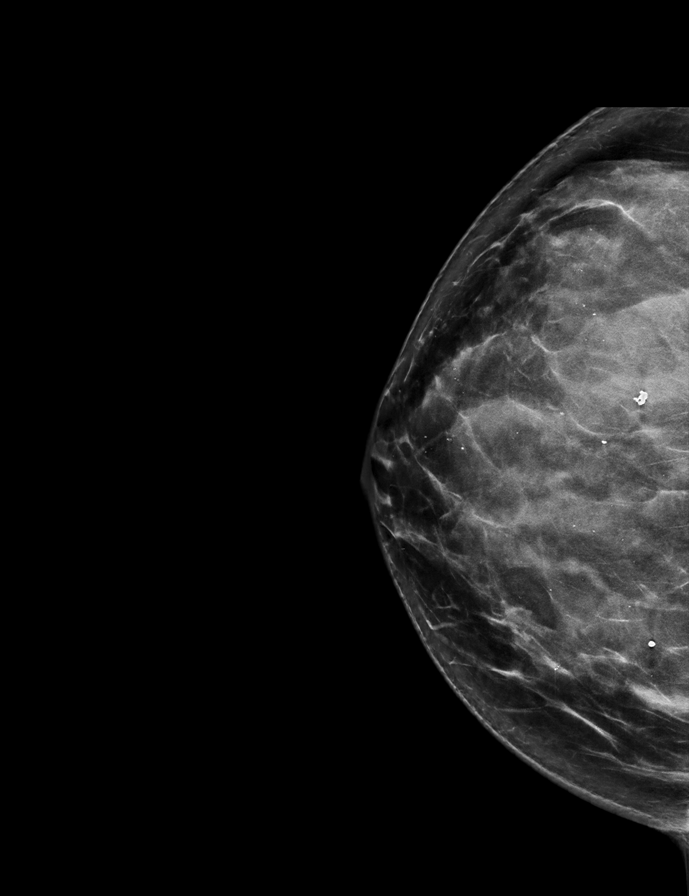

[L MLO synth-2D]
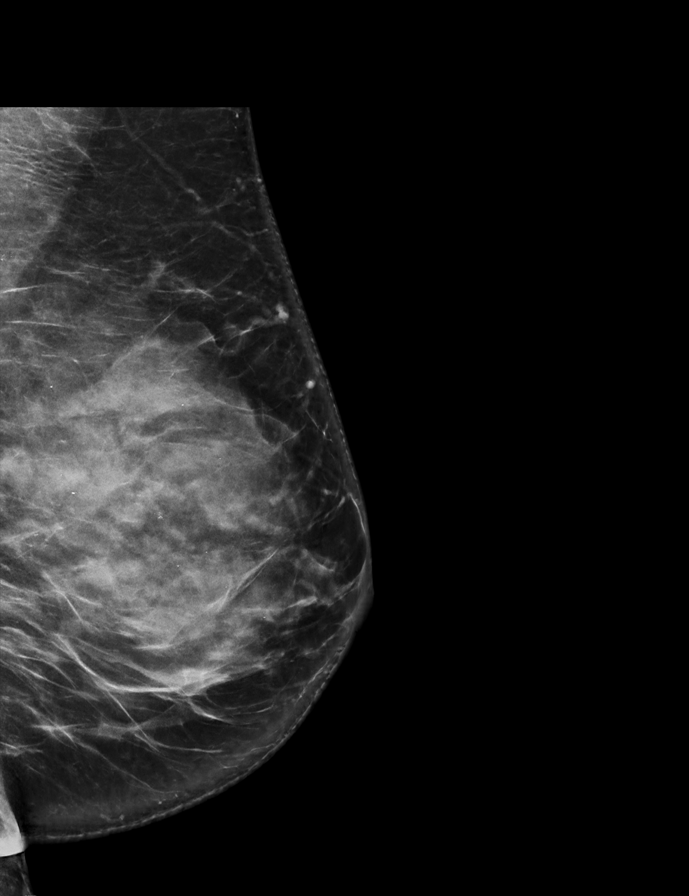

[R MLO synth-2D]
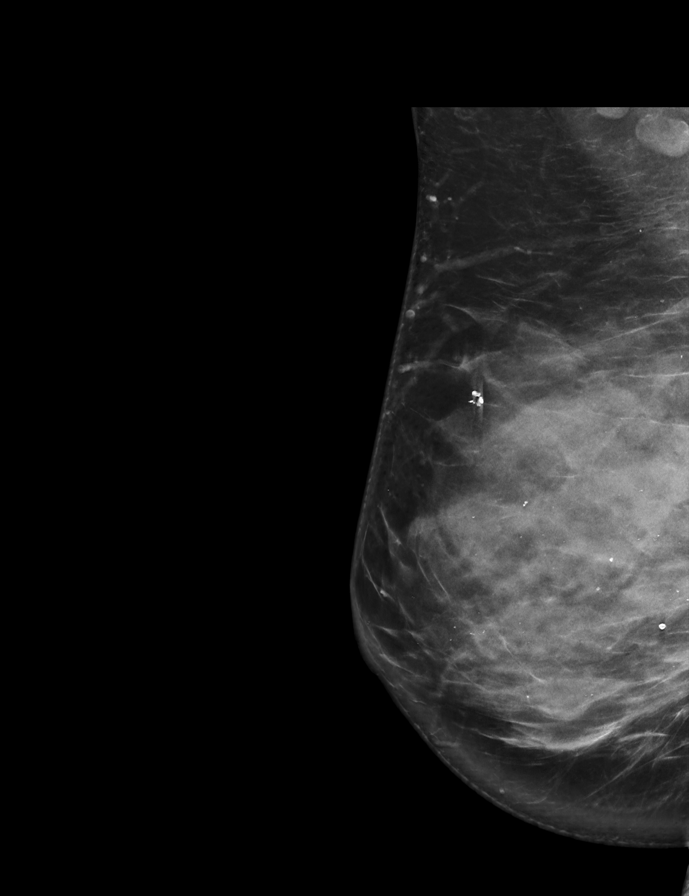

[L MLO tomo · tomo slice 43/86.0]
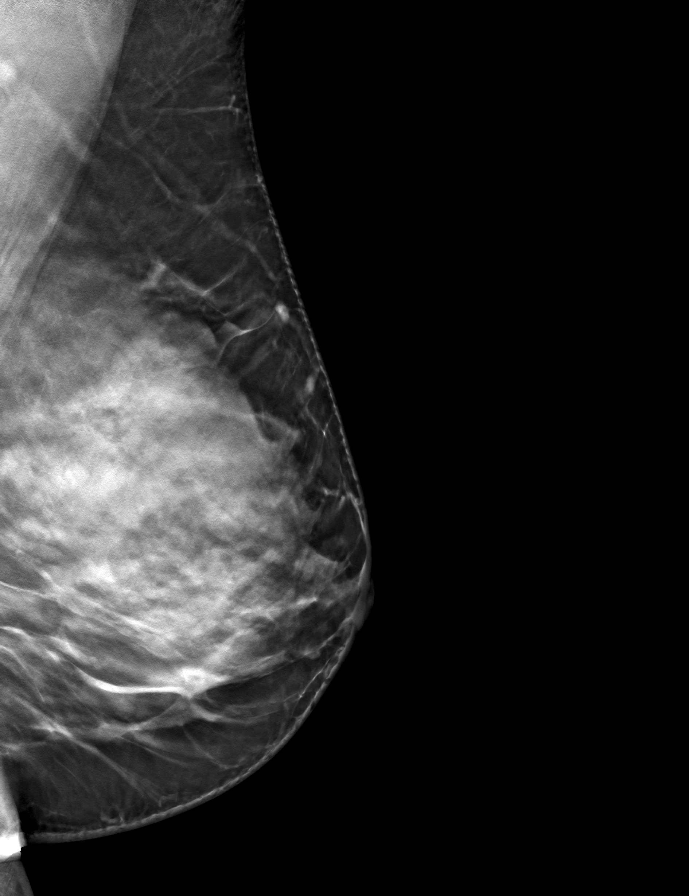

[R CC tomo · tomo slice 37/74.0]
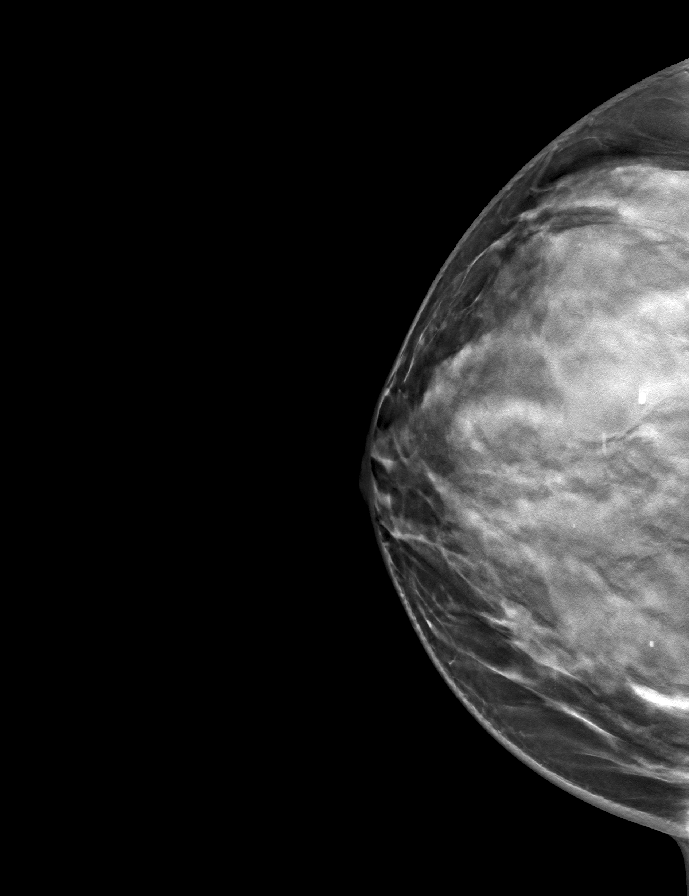

[R MLO tomo · tomo slice 43/86.0]
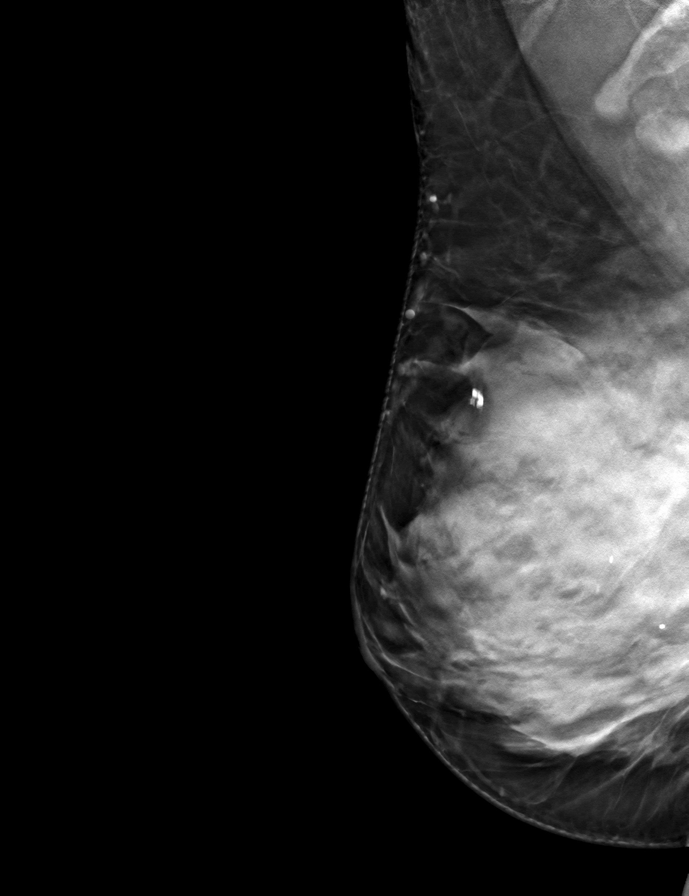

[L CC tomo · tomo slice 34/67.0]
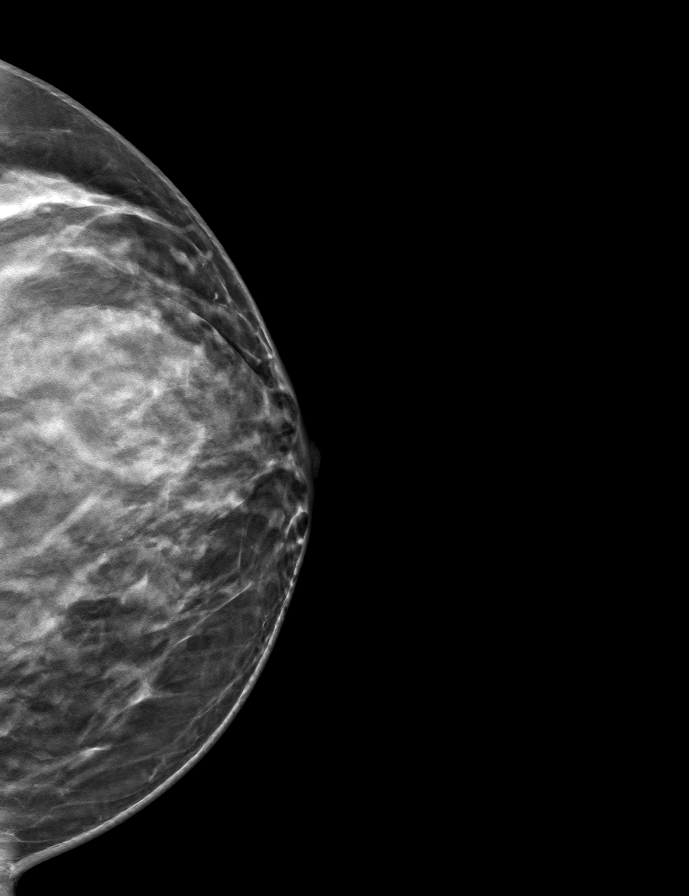

[8 of 24 positions shown; findings below may reference images not displayed]

No prior ultrasound.

ACR Breast Density Category d: The breast tissue is extremely dense,
which lowers the sensitivity of mammography.
FINDINGS: Full field CC and MLO views of both breasts were obtained.

RIGHT: Predominantly obscured isodense masses in the upper breast
without associated architectural distortion or suspicious
calcifications. If anything, these masses have decreased in size
since the August 2019 mammogram. No suspicious findings elsewhere.

Targeted ultrasound is performed, demonstrating multiple
circumscribed anechoic masses with internal echoes in the UPPER
OUTER QUADRANT indicating benign mildly complicated cysts. At the
9:30 o'clock position 3 cm from the nipple a cyst measures
approximately 2.1 x 1.4 x 1.9 cm. Adjacent cysts at the 11 o'clock
position 1 cm from the nipple measure approximately 1.3 x 1.0 x
cm and 1.2 x 0.8 x 1.1 cm. No suspicious solid mass or abnormal
acoustic shadowing is identified.

LEFT: No findings suspicious for malignancy.
IMPRESSION: 1. No mammographic or sonographic evidence of malignancy involving
the RIGHT breast.
2. No mammographic evidence of malignancy involving the LEFT breast.
3. Benign cysts in the UPPER OUTER QUADRANT of the RIGHT breast.

RECOMMENDATION:
Screening mammogram in one year.(Code:PL-W-JGE)

I have discussed the findings and recommendations with the patient.
Strategies for alleviating breast pain including decreasing caffeine
intake and vitamin-E supplementation were discussed with the
patient.

If applicable, a reminder letter will be sent to the patient
regarding the next appointment.

BI-RADS CATEGORY  2: Benign.

## 2022-04-24 LAB — URINE CULTURE

## 2022-04-27 LAB — CERVICOVAGINAL ANCILLARY ONLY
Bacterial Vaginitis (gardnerella): NEGATIVE
Candida Glabrata: NEGATIVE
Candida Vaginitis: POSITIVE — AB
Comment: NEGATIVE
Comment: NEGATIVE
Comment: NEGATIVE
Comment: NEGATIVE
Trichomonas: NEGATIVE

## 2022-05-03 ENCOUNTER — Encounter: Payer: Self-pay | Admitting: Certified Registered Nurse Anesthetist

## 2022-05-06 ENCOUNTER — Telehealth: Payer: Self-pay | Admitting: Gastroenterology

## 2022-05-06 ENCOUNTER — Telehealth: Payer: Self-pay | Admitting: *Deleted

## 2022-05-06 DIAGNOSIS — Z1211 Encounter for screening for malignant neoplasm of colon: Secondary | ICD-10-CM

## 2022-05-06 DIAGNOSIS — Z419 Encounter for procedure for purposes other than remedying health state, unspecified: Secondary | ICD-10-CM | POA: Diagnosis not present

## 2022-05-06 MED ORDER — PLENVU 140 G PO SOLR: 1.0000 | ORAL | Status: DC

## 2022-05-06 NOTE — Telephone Encounter (Signed)
Spoke with pt and RX faxed to [facility] on [address] per pt request.

## 2022-05-06 NOTE — Telephone Encounter (Signed)
Inbound call from patient, would like plenvu sent to [facility] pharmacy, states they advised the prescription was not there when she went to pick it up today.

## 2022-05-06 NOTE — Telephone Encounter (Signed)
Patient called stating that [facility] and the pharmacy told her that still did not receive the prescription. Please advise, thank you.

## 2022-05-06 NOTE — Telephone Encounter (Signed)
Pt request RX be sent to pharmacy on [address]. Stated they did not [facility] at previous [facility]. [facility] to [facility] [address] and pt made aware.

## 2022-05-07 ENCOUNTER — Telehealth: Payer: Self-pay | Admitting: *Deleted

## 2022-05-07 MED ORDER — NA SULFATE-K SULFATE-MG SULF 17.5-3.13-1.6 GM/177ML PO SOLN: 1.0000 | Freq: Once | ORAL | 0 refills | Status: DC

## 2022-05-07 MED ORDER — PLENVU 140 G PO SOLR: 1.0000 | Freq: Once | ORAL | 0 refills | Status: AC

## 2022-05-07 NOTE — Addendum Note (Signed)
Addended by: Karl Bales B on: 05/07/2022 12:03 PM   Modules accepted: Orders

## 2022-05-07 NOTE — Telephone Encounter (Signed)
Plenvu sent to pharmacy- on [address] in [place] per pt request.  I spoke with Greig Castilla, pharmacist, at [facility] and he hadn't received RX.  Plenvu 140 gram, 1 kit, once, no refills order given verbally.  Spoke with pt and she is aware it has been called in

## 2022-05-07 NOTE — Telephone Encounter (Signed)
Spoke with pharmacy and they stated prep is ready for pick up.

## 2022-05-07 NOTE — Telephone Encounter (Signed)
Patient is calling states her pharmacy still does not have her medication requesting it now to go to [facility] [address]. Pt states she is starving and needs to know asap if her procedure tomorrow needs to be cancelled. Please advise

## 2022-05-07 NOTE — Telephone Encounter (Signed)
Spoke with pt and she is heading to the pharmacy on [address] to see if they have the prep. She states she will call back if they do not.

## 2022-05-07 NOTE — Telephone Encounter (Signed)
Call to pharmacy to verify if prep was ready. Staff stated that it is ready for pick up.

## 2022-05-08 ENCOUNTER — Encounter: Payer: Self-pay | Admitting: Gastroenterology

## 2022-05-08 ENCOUNTER — Ambulatory Visit (AMBULATORY_SURGERY_CENTER): Payer: Medicaid Other | Admitting: Gastroenterology

## 2022-05-08 VITALS — BP 121/70 | HR 77 | Temp 97.5°F | Resp 23 | Ht 59.0 in | Wt 176.0 lb

## 2022-05-08 DIAGNOSIS — D125 Benign neoplasm of sigmoid colon: Secondary | ICD-10-CM | POA: Diagnosis not present

## 2022-05-08 DIAGNOSIS — D123 Benign neoplasm of transverse colon: Secondary | ICD-10-CM | POA: Diagnosis not present

## 2022-05-08 DIAGNOSIS — Z1211 Encounter for screening for malignant neoplasm of colon: Secondary | ICD-10-CM

## 2022-05-08 DIAGNOSIS — E119 Type 2 diabetes mellitus without complications: Secondary | ICD-10-CM | POA: Diagnosis not present

## 2022-05-08 MED ORDER — SODIUM CHLORIDE 0.9 % IV SOLN: 500.0000 mL | Freq: Once | INTRAVENOUS | Status: DC

## 2022-05-08 NOTE — Progress Notes (Signed)
Pt's states no medical or surgical changes since previsit or office visit. 

## 2022-05-08 NOTE — Progress Notes (Signed)
Report given to PACU, vss 

## 2022-05-08 NOTE — Patient Instructions (Signed)
Discharge instructions given. Handouts on polyps and hemorrhoids. No aspirin,ibuprofen,or other non-steroidal anti-inflammatory drugs for 5 days. Resume previous medications. YOU HAD AN ENDOSCOPIC PROCEDURE TODAY AT THE [facility]:   Refer to the procedure report that was given to you for any specific questions about what was found during the examination.  If the procedure report does not answer your questions, please call your gastroenterologist to clarify.  If you requested that your care partner not be given the details of your procedure findings, then the procedure report has been included in a sealed envelope for you to review at your convenience later.  YOU SHOULD EXPECT: Some feelings of bloating in the abdomen. Passage of more gas than usual.  Walking can help get rid of the air that was put into your GI tract during the procedure and reduce the bloating. If you had a lower endoscopy (such as a colonoscopy or flexible sigmoidoscopy) you may notice spotting of blood in your stool or on the toilet paper. If you underwent a bowel prep for your procedure, you may not have a normal bowel movement for a few days.  Please Note:  You might notice some irritation and congestion in your nose or some drainage.  This is from the oxygen used during your procedure.  There is no need for concern and it should clear up in a day or so.  SYMPTOMS TO REPORT IMMEDIATELY:  Following lower endoscopy (colonoscopy or flexible sigmoidoscopy):  Excessive amounts of blood in the stool  Significant tenderness or worsening of abdominal pains  Swelling of the abdomen that is new, acute  Fever of 100F or higher   For urgent or emergent issues, a gastroenterologist can be reached at any hour by calling [phone]. Do not use MyChart messaging for urgent concerns.    DIET:  We do recommend a small meal at first, but then you may proceed to your regular diet.  Drink plenty of fluids but you should  avoid alcoholic beverages for 24 hours.  ACTIVITY:  You should plan to take it easy for the rest of today and you should NOT DRIVE or use heavy machinery until tomorrow (because of the sedation medicines used during the test).    FOLLOW UP: Our staff will call the number listed on your records the next business day following your procedure.  We will call around 7:15- 8:00 am to check on you and address any questions or concerns that you may have regarding the information given to you following your procedure. If we do not reach you, we will leave a message.     If any biopsies were taken you will be contacted by phone or by letter within the next 1-3 weeks.  Please call us at [phone] if you have not heard about the biopsies in 3 weeks.    SIGNATURES/CONFIDENTIALITY: You and/or your care partner have signed paperwork which will be entered into your electronic medical record.  These signatures attest to the fact that that the information above on your After Visit Summary has been reviewed and is understood.  Full responsibility of the confidentiality of this discharge information lies with you and/or your care-partner.

## 2022-05-08 NOTE — Op Note (Signed)
[facility] Patient Name: [name] Procedure Date: 05/08/2022 10:01 AM MRN: [id number] Endoscopist: Lynann Bologna , MD, [id number] Age: 52 Referring MD:  Date of Birth: 08-Aug-1970 Gender: Female Account #: [id number] Procedure:                Colonoscopy Indications:              Screening for colorectal malignant neoplasm Medicines:                Monitored Anesthesia Care Procedure:                Pre-Anesthesia Assessment:                           - Prior to the procedure, a History and Physical                            was performed, and patient medications and                            allergies were reviewed. The patient's tolerance of                            previous anesthesia was also reviewed. The risks                            and benefits of the procedure and the sedation                            options and risks were discussed with the patient.                            All questions were answered, and informed consent                            was obtained. Prior Anticoagulants: The patient has                            taken no anticoagulant or antiplatelet agents. ASA                            Grade Assessment: II - A patient with mild systemic                            disease. After reviewing the risks and benefits,                            the patient was deemed in satisfactory condition to                            undergo the procedure.                           After obtaining informed consent, the colonoscope  was passed under direct vision. Throughout the                            procedure, the patient's blood pressure, pulse, and                            oxygen saturations were monitored continuously. The                            Olympus SN [id number] was introduced through the anus                            and advanced to the the cecum, identified by                            appendiceal orifice  and ileocecal valve. The                            colonoscopy was performed without difficulty. The                            patient tolerated the procedure well. The quality                            of the bowel preparation was good. The ileocecal                            valve, appendiceal orifice, and rectum were                            photographed. Scope In: 11:29:05 AM Scope Out: 11:45:27 AM Scope Withdrawal Time: 0 hours 13 minutes 49 seconds  Total Procedure Duration: 0 hours 16 minutes 22 seconds  Findings:                 A 10 mm polyp was found in the proximal transverse                            colon. The polyp was semi-pedunculated. The polyp                            was removed with a hot snare. Resection and                            retrieval were complete.                           Two sessile polyps were found in the proximal                            transverse colon and mid transverse colon. The                            polyps were 4 to 6 mm in size. These polyps  were                            removed with a cold snare. Resection and retrieval                            were complete.                           A 12 mm polyp was found in the distal sigmoid                            colon, 25 cm from the anal verge. The polyp was                            pedunculated. The polyp was removed with a hot                            snare. Resection and retrieval were complete.                           Non-bleeding internal hemorrhoids were found during                            retroflexion. The hemorrhoids were small and Grade                            I (internal hemorrhoids that do not prolapse).                           The exam was otherwise without abnormality on                            direct and retroflexion views. Complications:            No immediate complications. Estimated Blood Loss:     Estimated blood loss: none. Impression:                - One 10 mm polyp in the proximal transverse colon,                            removed with a hot snare. Resected and retrieved.                           - Two 4 to 6 mm polyps in the proximal transverse                            colon and in the mid transverse colon, removed with                            a cold snare. Resected and retrieved.                           - One 12 mm polyp in the distal sigmoid colon,  removed with a hot snare. Resected and retrieved.                           - Non-bleeding internal hemorrhoids.                           - The examination was otherwise normal on direct                            and retroflexion views.                           - The GI Genius (intelligent endoscopy module),                            computer-aided polyp detection system powered by AI                            was utilized to detect colorectal polyps through                            enhanced visualization during colonoscopy. Recommendation:           - Patient has a contact number available for                            emergencies. The signs and symptoms of potential                            delayed complications were discussed with the                            patient. Return to normal activities tomorrow.                            Written discharge instructions were provided to the                            patient.                           - Resume previous diet.                           - Continue present medications.                           - No aspirin, ibuprofen, naproxen, or other                            non-steroidal anti-inflammatory drugs for 5 days                            after polyp removal.                           - Await pathology  results.                           - Repeat colonoscopy for surveillance based on                            pathology results.                           - The findings  and recommendations were discussed                            with the patient's family. Lynann Bologna, MD 05/08/2022 11:49:48 AM This report has been signed electronically.

## 2022-05-08 NOTE — Progress Notes (Signed)
[facility] History and Physical   Primary Care Physician:  Eden Emms, NP   Reason for Procedure:    CRC screening  Plan:     colonoscopy     HPI: [name] is a 52 y.o. female    Past Medical History:  Diagnosis Date   Back pain    Chronic kidney disease    kidney stones   History of kidney stones    PONV (postoperative nausea and vomiting)    Poorly controlled type 2 diabetes mellitus (HCC)     Past Surgical History:  Procedure Laterality Date   CHOLECYSTECTOMY, LAPAROSCOPIC     CYSTOSCOPY WITH URETEROSCOPY AND STENT PLACEMENT Bilateral 07/29/2012   Procedure: CYSTOSCOPY WITH BILATERAL URETEROSCOPY AND BILATERAL STENT PLACEMENT, BASKET EXTRACTION OF BILATERAL URETERAL STONES;  Surgeon: Sebastian Ache, MD;  Location: [facility];  Service: Urology;  Laterality: Bilateral;   EXTRACORPOREAL SHOCK WAVE LITHOTRIPSY Right 08/12/2020   Procedure: RIGHT EXTRACORPOREAL SHOCK WAVE LITHOTRIPSY (ESWL);  Surgeon: Rene Paci, MD;  Location: [facility];  Service: Urology;  Laterality: Right;   HAND SURGERY  10/29/2020    Prior to Admission medications   Medication Sig Start Date End Date Taking? Authorizing Provider  glucose blood test strip Daily strip use 03/24/22  Yes Eden Emms, NP  metFORMIN (GLUCOPHAGE) 500 MG tablet Take 2 tablets (1,000mg ) in the am and 1 tablet (500mg ) in the pm 03/04/22  Yes Eden Emms, NP  valsartan-hydrochlorothiazide (DIOVAN-HCT) 160-12.5 MG tablet Take 1 tablet by mouth daily. 03/04/22  Yes Eden Emms, NP  acetaminophen (TYLENOL) 500 MG tablet Take 2 tablets (1,000 mg total) by mouth every 8 (eight) hours as needed for mild pain or moderate pain. 01/15/22   Crain, Whitney L, PA  atorvastatin (LIPITOR) 10 MG tablet Take 10 mg by mouth at bedtime. Patient not taking: Reported on 04/21/2022 08/21/21   [provider]  cyclobenzaprine (FLEXERIL) 5 MG tablet Take 1 tablet (5 mg total) by mouth 3  (three) times daily as needed for muscle spasms. Patient not taking: Reported on 05/08/2022 03/24/22   Eden Emms, NP  fluconazole (DIFLUCAN) 150 MG tablet Take 1 tablet (150 mg total) by mouth every 3 (three) days. For three doses Patient not taking: Reported on 05/08/2022 04/22/22   Anyanwu, Jethro Bastos, MD  glipiZIDE (GLUCOTROL) 5 MG tablet Take 1 tablet (5 mg total) by mouth 2 (two) times daily before a meal. Patient not taking: Reported on 05/08/2022 03/04/22   Eden Emms, NP  levonorgestrel (MIRENA) 20 MCG/24HR IUD 1 each by Intrauterine route once.    [provider]  nystatin cream (MYCOSTATIN) Apply 1 Application topically 3 (three) times daily. 04/22/22   Anyanwu, Jethro Bastos, MD  famotidine (PEPCID) 40 MG tablet Take 1 tablet (40 mg total) by mouth daily. 02/12/17 04/14/19  Ofilia Neas, PA-C  levocetirizine (XYZAL) 5 MG tablet Take 1 tablet (5 mg total) by mouth every evening. 02/12/17 04/14/19  Ofilia Neas, PA-C  SUMAtriptan (IMITREX) 25 MG tablet Take 1 tablet (25 mg total) by mouth every 2 (two) hours as needed for migraine. May repeat in 2 hours if headache persists or recurs. 07/22/18 04/14/19  Janeece Agee, NP    Current Outpatient Medications  Medication Sig Dispense Refill   glucose blood test strip Daily strip use 100 each 12   metFORMIN (GLUCOPHAGE) 500 MG tablet Take 2 tablets (1,000mg ) in the am and 1 tablet (500mg ) in the pm 90  tablet 3   valsartan-hydrochlorothiazide (DIOVAN-HCT) 160-12.5 MG tablet Take 1 tablet by mouth daily. 90 tablet 1   acetaminophen (TYLENOL) 500 MG tablet Take 2 tablets (1,000 mg total) by mouth every 8 (eight) hours as needed for mild pain or moderate pain. 30 tablet 0   atorvastatin (LIPITOR) 10 MG tablet Take 10 mg by mouth at bedtime. (Patient not taking: Reported on 04/21/2022)     cyclobenzaprine (FLEXERIL) 5 MG tablet Take 1 tablet (5 mg total) by mouth 3 (three) times daily as needed for muscle spasms. (Patient not taking: Reported on  05/08/2022) 30 tablet 0   fluconazole (DIFLUCAN) 150 MG tablet Take 1 tablet (150 mg total) by mouth every 3 (three) days. For three doses (Patient not taking: Reported on 05/08/2022) 3 tablet 3   glipiZIDE (GLUCOTROL) 5 MG tablet Take 1 tablet (5 mg total) by mouth 2 (two) times daily before a meal. (Patient not taking: Reported on 05/08/2022) 60 tablet 3   levonorgestrel (MIRENA) 20 MCG/24HR IUD 1 each by Intrauterine route once.     nystatin cream (MYCOSTATIN) Apply 1 Application topically 3 (three) times daily. 30 g 1   Current Facility-Administered Medications  Medication Dose Route Frequency Provider Last Rate Last Admin   0.9 %  sodium chloride infusion  500 mL Intravenous Once Lynann Bologna, MD        Allergies as of 05/08/2022 - Review Complete 05/08/2022  Allergen Reaction Noted   Tramadol Hives, Itching, and Rash 06/22/2012   Hydrocodone-acetaminophen Itching 01/18/2017   Flomax [tamsulosin hcl] Palpitations 01/18/2017    Family History  Problem Relation Age of Onset   Other Neg Hx    Colon cancer Neg Hx    Colon polyps Neg Hx    Esophageal cancer Neg Hx    Rectal cancer Neg Hx    Stomach cancer Neg Hx     Social History   Socioeconomic History   Marital status: Married    Spouse name: Long   Number of children: 1   Years of education: Not on file   Highest education level: Not on file  Occupational History   Not on file  Tobacco Use   Smoking status: Never   Smokeless tobacco: Never  Vaping Use   Vaping Use: Never used  Substance and Sexual Activity   Alcohol use: No   Drug use: No   Sexual activity: Yes    Birth control/protection: I.U.D.  Other Topics Concern   Not on file  Social History Narrative   Retied      Lowella Bandy (29) daughter   Danne Harbor (11)      Hobbies: hiking, spend time with family. Has 2 dogs   Social Determinants of Corporate investment banker Strain: Not on file  Food Insecurity: Not on file  Transportation Needs: Not on file  Physical  Activity: Not on file  Stress: Not on file  Social Connections: Not on file  Intimate Partner Violence: Not on file    Review of Systems: Positive for none All other review of systems negative except as mentioned in the HPI.  Physical Exam: Vital signs in last 24 hours: @VSRANGES @   General:   Alert,  Well-developed, well-nourished, pleasant and cooperative in NAD Lungs:  Clear throughout to auscultation.   Heart:  Regular rate and rhythm; no murmurs, clicks, rubs,  or gallops. Abdomen:  Soft, nontender and nondistended. Normal bowel sounds.   Neuro/Psych:  Alert and cooperative. Normal mood and affect. A and O x  3    No significant changes were identified.  The patient continues to be an appropriate candidate for the planned procedure and anesthesia.   Edman Circle, MD. [facility] 05/08/2022 11:21 AM@

## 2022-05-10 ENCOUNTER — Other Ambulatory Visit: Payer: Self-pay | Admitting: Nurse Practitioner

## 2022-05-10 DIAGNOSIS — E1165 Type 2 diabetes mellitus with hyperglycemia: Secondary | ICD-10-CM

## 2022-05-11 ENCOUNTER — Telehealth: Payer: Self-pay

## 2022-05-11 NOTE — Telephone Encounter (Signed)
  Follow up Call-     05/08/2022   10:36 AM  Call back number  Post procedure Call Back phone  # [phone]  Permission to leave phone message Yes     Patient questions:  Do you have a fever, pain , or abdominal swelling? No. Pain Score  0 *  Have you tolerated food without any problems? Yes.    Have you been able to return to your normal activities? Yes.    Do you have any questions about your discharge instructions: Diet   No. Medications  No. Follow up visit  No.  Do you have questions or concerns about your Care? No.  Actions: * If pain score is 4 or above: No action needed, pain <4.

## 2022-05-13 ENCOUNTER — Encounter: Payer: Self-pay | Admitting: Gastroenterology

## 2022-05-23 ENCOUNTER — Other Ambulatory Visit: Payer: Self-pay | Admitting: Student

## 2022-05-23 ENCOUNTER — Other Ambulatory Visit: Payer: Self-pay | Admitting: Nurse Practitioner

## 2022-05-23 DIAGNOSIS — E1165 Type 2 diabetes mellitus with hyperglycemia: Secondary | ICD-10-CM

## 2022-06-01 ENCOUNTER — Telehealth: Payer: Self-pay | Admitting: Nurse Practitioner

## 2022-06-01 NOTE — Telephone Encounter (Signed)
Got notice the pain clinic reached out and she does not want interventions just medication. There is a clinic in [place] that may offer that if she is interested

## 2022-06-02 ENCOUNTER — Encounter: Payer: Self-pay | Admitting: Nurse Practitioner

## 2022-06-02 ENCOUNTER — Ambulatory Visit (INDEPENDENT_AMBULATORY_CARE_PROVIDER_SITE_OTHER): Payer: Medicaid Other | Admitting: Nurse Practitioner

## 2022-06-02 VITALS — BP 136/82 | HR 105 | Temp 98.5°F | Resp 16 | Ht 59.0 in | Wt 178.2 lb

## 2022-06-02 DIAGNOSIS — M545 Low back pain, unspecified: Secondary | ICD-10-CM

## 2022-06-02 DIAGNOSIS — Z6836 Body mass index (BMI) 36.0-36.9, adult: Secondary | ICD-10-CM | POA: Diagnosis not present

## 2022-06-02 DIAGNOSIS — E1165 Type 2 diabetes mellitus with hyperglycemia: Secondary | ICD-10-CM

## 2022-06-02 DIAGNOSIS — Z7984 Long term (current) use of oral hypoglycemic drugs: Secondary | ICD-10-CM

## 2022-06-02 DIAGNOSIS — E669 Obesity, unspecified: Secondary | ICD-10-CM | POA: Diagnosis not present

## 2022-06-02 LAB — POCT GLYCOSYLATED HEMOGLOBIN (HGB A1C): Hemoglobin A1C: 9.5 % — AB (ref 4.0–5.6)

## 2022-06-02 MED ORDER — TIRZEPATIDE 2.5 MG/0.5ML ~~LOC~~ SOAJ
2.5000 mg | SUBCUTANEOUS | 0 refills | Status: DC
Start: 2022-06-02 — End: 2022-06-10

## 2022-06-02 NOTE — Telephone Encounter (Signed)
Spoke to pt and she said that she is interested in the one in [place].

## 2022-06-02 NOTE — Progress Notes (Signed)
Established Patient Office Visit  Subjective   Patient ID: [name], female    DOB: [birth year]  Age: 52 y.o. MRN: [id number]  Chief Complaint  Patient presents with   Diabetes     DM2: patient is currenlty on metformin and glipizde daily. Her last A1C was 11.8. Today it is 9.5. States that she will check it when she feels off. Statse that she doe snot check it regularly. States that she last checked was in the 160s.  States that she has been feeling good since she got back from her trip to [place] for a family function  She will walk daily outside weather premitting   Meals 2-3 a day depending. Some snacking. She does eat lots of fruit      Review of Systems  Constitutional:  Negative for chills and fever.  Respiratory:  Negative for shortness of breath.   Cardiovascular:  Negative for chest pain.  Gastrointestinal:  Negative for abdominal pain, constipation, diarrhea, nausea and vomiting.      Objective:     BP 136/82   Pulse (!) 105   Temp 98.5 F (36.9 C)   Resp 16   Ht 4\' 11"  (1.499 m)   Wt 178 lb 4 oz (80.9 kg)   SpO2 98%   BMI 36.00 kg/m  BP Readings from Last 3 Encounters:  06/02/22 136/82  05/08/22 121/70  04/22/22 136/85   Wt Readings from Last 3 Encounters:  06/02/22 178 lb 4 oz (80.9 kg)  05/08/22 176 lb (79.8 kg)  04/22/22 179 lb (81.2 kg)      Physical Exam Vitals and nursing note reviewed.  Constitutional:      Appearance: Normal appearance.  Cardiovascular:     Rate and Rhythm: Normal rate and regular rhythm.     Heart sounds: Normal heart sounds.  Pulmonary:     Effort: Pulmonary effort is normal.     Breath sounds: Normal breath sounds.  Neurological:     Mental Status: She is alert.      Results for orders placed or performed in visit on 06/02/22  POCT glycosylated hemoglobin (Hb A1C)  Result Value Ref Range   Hemoglobin A1C 9.5 (A) 4.0 - 5.6 %   HbA1c POC (<> result, manual entry)     HbA1c, POC (prediabetic range)      HbA1c, POC (controlled diabetic range)        The ASCVD Risk score (Arnett DK, et al., 2019) failed to calculate for the following reasons:   Cannot find a previous HDL lab   Cannot find a previous total cholesterol lab    Assessment & Plan:   Problem List Items Addressed This Visit       Endocrine   Uncontrolled type 2 diabetes mellitus with hyperglycemia (HCC) - Primary    Patient's A1c had a modest reduction.  Will continue patient on 1000 mg of metformin every morning and 500 mg every afternoon.  Glipizide 5 mg twice daily will add on Mounjaro 2.5 mg once a week injection.  Follow-up in 3 months sooner if needed did encourage patient to check glucose daily.      Relevant Medications   tirzepatide (MOUNJARO) 2.5 MG/0.5ML Pen   Other Relevant Orders   POCT glycosylated hemoglobin (Hb A1C) (Completed)     Other   Lumbar pain    Chronic at this juncture.  Was referred to pain management clinic locally but patient refused interventional treatments.  She medication management.  Ambulatory referral  to the pain clinic placed today.  Will try to help patient lose weight medically this may offer some benefit to her back pain.      Relevant Orders   Ambulatory referral to Pain Clinic   Obesity (BMI 30-39.9)    Continue working on lifestyle modifications.  Patient somewhat limited orthopedically in regards to exercise will start patient on Mounjaro 2.5 mg to aid in weight reduction along with glycemic control.      Relevant Medications   tirzepatide [facility]) 2.5 MG/0.5ML Pen    Return in about 3 months (around 09/02/2022) for DM recheck.    Audria Nine, NP

## 2022-06-02 NOTE — Patient Instructions (Signed)
Nice to see you today We will keep your medications the same and add on the mounjaro injection  Follow up with me in 3 months, sooner if you need me  Make a nurse visit to have a teaching visit with the [facility]

## 2022-06-02 NOTE — Assessment & Plan Note (Signed)
Patient's A1c had a modest reduction.  Will continue patient on 1000 mg of metformin every morning and 500 mg every afternoon.  Glipizide 5 mg twice daily will add on Mounjaro 2.5 mg once a week injection.  Follow-up in 3 months sooner if needed did encourage patient to check glucose daily.

## 2022-06-02 NOTE — Assessment & Plan Note (Signed)
Continue working on lifestyle modifications.  Patient somewhat limited orthopedically in regards to exercise will start patient on Mounjaro 2.5 mg to aid in weight reduction along with glycemic control.

## 2022-06-02 NOTE — Assessment & Plan Note (Signed)
Chronic at this juncture.  Was referred to pain management clinic locally but patient refused interventional treatments.  She medication management.  Ambulatory referral to the pain clinic placed today.  Will try to help patient lose weight medically this may offer some benefit to her back pain.

## 2022-06-02 NOTE — Telephone Encounter (Signed)
Referral placed during office visit.  

## 2022-06-04 ENCOUNTER — Encounter: Payer: Self-pay | Admitting: *Deleted

## 2022-06-05 ENCOUNTER — Ambulatory Visit
Admission: RE | Admit: 2022-06-05 | Discharge: 2022-06-05 | Disposition: A | Discharge status: Home / Self Care | Payer: Medicaid Other | Source: Ambulatory Visit | Attending: Family Medicine | Admitting: Family Medicine

## 2022-06-05 DIAGNOSIS — N63 Unspecified lump in unspecified breast: Secondary | ICD-10-CM

## 2022-06-05 DIAGNOSIS — Z01419 Encounter for gynecological examination (general) (routine) without abnormal findings: Secondary | ICD-10-CM

## 2022-06-05 DIAGNOSIS — R92341 Mammographic extreme density, right breast: Secondary | ICD-10-CM | POA: Diagnosis not present

## 2022-06-05 DIAGNOSIS — N6001 Solitary cyst of right breast: Secondary | ICD-10-CM | POA: Diagnosis not present

## 2022-06-05 DIAGNOSIS — R922 Inconclusive mammogram: Secondary | ICD-10-CM | POA: Diagnosis not present

## 2022-06-06 DIAGNOSIS — Z419 Encounter for procedure for purposes other than remedying health state, unspecified: Secondary | ICD-10-CM | POA: Diagnosis not present

## 2022-06-10 ENCOUNTER — Telehealth: Payer: Self-pay | Admitting: Nurse Practitioner

## 2022-06-10 ENCOUNTER — Other Ambulatory Visit (HOSPITAL_COMMUNITY): Payer: Self-pay

## 2022-06-10 ENCOUNTER — Telehealth: Payer: Self-pay

## 2022-06-10 DIAGNOSIS — E1165 Type 2 diabetes mellitus with hyperglycemia: Secondary | ICD-10-CM

## 2022-06-10 MED ORDER — OZEMPIC (0.25 OR 0.5 MG/DOSE) 2 MG/3ML ~~LOC~~ SOPN
0.2500 mg | PEN_INJECTOR | SUBCUTANEOUS | 0 refills | Status: DC
Start: 2022-06-10 — End: 2022-07-02

## 2022-06-10 NOTE — Telephone Encounter (Signed)
Can we let patient know that we have to try ozempic instead of the mounjaro. I have sent in the ozempic prescription

## 2022-06-10 NOTE — Telephone Encounter (Signed)
Can we get a PA for ozempic

## 2022-06-10 NOTE — Telephone Encounter (Signed)
Pharmacy Patient Advocate Encounter  Received notification from [facility] that the request for prior authorization for [facility] has been denied due to .    Please be advised we currently do not have a Pharmacist to review denials, therefore you will need to process appeals accordingly as needed. Thanks for your support at this time.

## 2022-06-10 NOTE — Telephone Encounter (Signed)
Patient contacted the office regarding mounjaro, stated they pharmacy is needing provider approval to fill this medication. Please advise, thank you.

## 2022-06-10 NOTE — Telephone Encounter (Signed)
Noted  

## 2022-06-10 NOTE — Telephone Encounter (Signed)
PA submitted via CMM. Created new encounter for PA. Will route back to pool once determination has been made. 

## 2022-06-10 NOTE — Telephone Encounter (Signed)
Pharmacy Patient Advocate Encounter   Received notification from [facility] that prior authorization for [facility] 2.5MG /0.5ML pen-injectors is required/requested.   PA submitted to [facility] via CoverMyMeds Key or (Medicaid) confirmation # [id number] Status is pending

## 2022-06-10 NOTE — Addendum Note (Signed)
Addended by: Eden Emms on: 06/10/2022 04:40 PM   Modules accepted: Orders

## 2022-06-11 ENCOUNTER — Ambulatory Visit (INDEPENDENT_AMBULATORY_CARE_PROVIDER_SITE_OTHER): Payer: Medicaid Other

## 2022-06-11 ENCOUNTER — Other Ambulatory Visit (HOSPITAL_COMMUNITY): Payer: Self-pay

## 2022-06-11 DIAGNOSIS — E1165 Type 2 diabetes mellitus with hyperglycemia: Secondary | ICD-10-CM

## 2022-06-11 DIAGNOSIS — Z7985 Long-term (current) use of injectable non-insulin antidiabetic drugs: Secondary | ICD-10-CM | POA: Diagnosis not present

## 2022-06-11 NOTE — Progress Notes (Signed)
Patient in office today for Ozempic teaching. Patient has reviewed video on Ozempic instructions on manufacturers patient portal. Answered all questions asked by patient regarding video. Reviewed instructions with patient that were printed off patient portal with patient. Walked through instructions with patient. Patient instructed and voiced understanding on injection site cleaning, needle use and disposal, steps for administering injection and injection site care. Patient was able to administer injection in office following all steps and infection prevention measures with no questions or problems. Patient will reach out to office if anything problems or questions.

## 2022-06-12 ENCOUNTER — Telehealth: Payer: Self-pay

## 2022-06-12 ENCOUNTER — Other Ambulatory Visit (HOSPITAL_COMMUNITY): Payer: Self-pay

## 2022-06-12 NOTE — Telephone Encounter (Signed)
Patient Advocate Encounter   Received notification from PT that prior authorization for Ozempic is required.   PA submitted on 06/12/2022 [facility] Insurance [facility] Status is pending

## 2022-06-12 NOTE — Telephone Encounter (Signed)
PA has been submitted and documented in separate encounter

## 2022-06-12 NOTE — Telephone Encounter (Signed)
PA APPROVED THROUGH 06/12/23 

## 2022-06-12 NOTE — Telephone Encounter (Signed)
Patient has came into office for evaluation [name] has been received.

## 2022-06-29 DIAGNOSIS — G8929 Other chronic pain: Secondary | ICD-10-CM | POA: Diagnosis not present

## 2022-06-29 DIAGNOSIS — M5451 Vertebrogenic low back pain: Secondary | ICD-10-CM | POA: Diagnosis not present

## 2022-06-29 DIAGNOSIS — M5136 Other intervertebral disc degeneration, lumbar region: Secondary | ICD-10-CM | POA: Diagnosis not present

## 2022-06-29 DIAGNOSIS — M47817 Spondylosis without myelopathy or radiculopathy, lumbosacral region: Secondary | ICD-10-CM | POA: Diagnosis not present

## 2022-07-02 ENCOUNTER — Telehealth: Payer: Self-pay | Admitting: Nurse Practitioner

## 2022-07-02 DIAGNOSIS — E1165 Type 2 diabetes mellitus with hyperglycemia: Secondary | ICD-10-CM

## 2022-07-02 MED ORDER — OZEMPIC (0.25 OR 0.5 MG/DOSE) 2 MG/3ML ~~LOC~~ SOPN
0.5000 mg | PEN_INJECTOR | SUBCUTANEOUS | 1 refills | Status: DC
Start: 2022-07-02 — End: 2022-07-27

## 2022-07-02 NOTE — Telephone Encounter (Signed)
Prescription Request  07/02/2022  LOV: 06/02/2022  What is the name of the medication or equipment? Semaglutide,0.25 or 0.5MG /DOS, (OZEMPIC, 0.25 OR 0.5 MG/DOSE,) 2 MG/3ML SOPN   Have you contacted your pharmacy to request a refill? No   Which pharmacy would you like this sent to?  [name], [place] - [name] [place] [address] Phone: [phone] Fax: [phone]  Patient notified that their request is being sent to the clinical staff for review and that they should receive a response within 2 business days.   Please advise at Mobile [phone] (mobile)

## 2022-07-02 NOTE — Addendum Note (Signed)
Addended by: Eden Emms on: 07/02/2022 03:32 PM   Modules accepted: Orders

## 2022-07-02 NOTE — Telephone Encounter (Signed)
Called and spoke with pt.  She denied any nausea/vomiting or constipation.  I let her know that the dose is being increased on the new prescription to 0.5mg  weekly.  Pt verbalized understanding and is aware of call the office if she experiences any nausea/vomiting or constipation.

## 2022-07-02 NOTE — Telephone Encounter (Signed)
If she is tolerating the medication ok I will increase the does from 0.25 to 0.5mg  once a week

## 2022-07-06 DIAGNOSIS — Z419 Encounter for procedure for purposes other than remedying health state, unspecified: Secondary | ICD-10-CM | POA: Diagnosis not present

## 2022-07-27 ENCOUNTER — Telehealth: Payer: Self-pay | Admitting: Nurse Practitioner

## 2022-07-27 DIAGNOSIS — E1165 Type 2 diabetes mellitus with hyperglycemia: Secondary | ICD-10-CM

## 2022-07-27 MED ORDER — OZEMPIC (0.25 OR 0.5 MG/DOSE) 2 MG/3ML ~~LOC~~ SOPN: 0.5000 mg | PEN_INJECTOR | SUBCUTANEOUS | 1 refills | Status: DC

## 2022-07-27 NOTE — Telephone Encounter (Signed)
Refill provided

## 2022-07-27 NOTE — Telephone Encounter (Signed)
Prescription Request  07/27/2022  LOV: 06/02/2022  What is the name of the medication or equipment? Semaglutide,0.25 or 0.5MG /DOS, (OZEMPIC, 0.25 OR 0.5 MG/DOSE,) 2 MG/3ML SOPN   Have you contacted your pharmacy to request a refill? No   Which pharmacy would you like this sent to?  [name], [place] - [name] [place] [address] Phone: [phone] Fax: [phone]     Patient notified that their request is being sent to the clinical staff for review and that they should receive a response within 2 business days.   Please advise at Mobile [phone] (mobile)  Pt mentioned she believes the meds isn't working, she still has the same appetite as before, she doesn't think any eating changes have been made. Pt asked could a increase in dosage be given? Please advise.

## 2022-07-27 NOTE — Addendum Note (Signed)
Addended by: Eden Emms on: 07/27/2022 01:20 PM   Modules accepted: Orders

## 2022-07-30 NOTE — Telephone Encounter (Signed)
Patient contacted the office regarding this request, was told she needs PA for this medication. Please advise, thank you.

## 2022-08-03 ENCOUNTER — Other Ambulatory Visit (HOSPITAL_COMMUNITY): Payer: Self-pay

## 2022-08-03 ENCOUNTER — Telehealth: Payer: Self-pay

## 2022-08-03 NOTE — Telephone Encounter (Signed)
Pharmacy Patient Advocate Encounter   Received notification from Pt Calls Messages that prior authorization for Ozempic 2mg /17ml is required/requested.   Insurance verification completed.   The patient is insured through [employer] [place] .   Per test claim: PA required; PA submitted to [facility] Medicaid via CoverMyMeds Key/confirmation #/EOC [id number] Status is pending

## 2022-08-04 ENCOUNTER — Other Ambulatory Visit (HOSPITAL_COMMUNITY): Payer: Self-pay

## 2022-08-04 NOTE — Telephone Encounter (Signed)
Pharmacy Patient Advocate Encounter  Received notification from  [facility] Medicaid  that Prior Authorization for Ozempic 2mg /106ml has been CANCELLED due to; being a duplicate request, refer to case number [id number] approved on 06/12/22.   PA #/Case ID/Reference #: [id number]   Cancelled PA letter indexed to chart  [facility].

## 2022-08-05 ENCOUNTER — Encounter (INDEPENDENT_AMBULATORY_CARE_PROVIDER_SITE_OTHER): Payer: Self-pay

## 2022-08-06 DIAGNOSIS — Z419 Encounter for procedure for purposes other than remedying health state, unspecified: Secondary | ICD-10-CM | POA: Diagnosis not present

## 2022-08-30 ENCOUNTER — Encounter (HOSPITAL_COMMUNITY): Payer: Self-pay | Admitting: *Deleted

## 2022-08-30 ENCOUNTER — Ambulatory Visit (HOSPITAL_COMMUNITY)
Admission: EM | Admit: 2022-08-30 | Discharge: 2022-08-30 | Disposition: A | Discharge status: Home / Self Care | Payer: Medicaid Other | Attending: Family Medicine | Admitting: Family Medicine

## 2022-08-30 DIAGNOSIS — L309 Dermatitis, unspecified: Secondary | ICD-10-CM

## 2022-08-30 DIAGNOSIS — H9202 Otalgia, left ear: Secondary | ICD-10-CM | POA: Diagnosis not present

## 2022-08-30 MED ORDER — CETIRIZINE HCL 10 MG PO TABS: 10.0000 mg | ORAL_TABLET | Freq: Every day | ORAL | 0 refills | Status: DC | PRN

## 2022-08-30 MED ORDER — TRIAMCINOLONE ACETONIDE 0.1 % EX CREA: 1.0000 | TOPICAL_CREAM | Freq: Two times a day (BID) | CUTANEOUS | 0 refills | Status: DC

## 2022-08-30 MED ORDER — IBUPROFEN 800 MG PO TABS: 800.0000 mg | ORAL_TABLET | Freq: Three times a day (TID) | ORAL | 0 refills | Status: DC | PRN

## 2022-08-30 NOTE — Discharge Instructions (Signed)
Take ibuprofen 800 mg--1 tab every 8 hours as needed for pain.  Zyrtec/cetirizine 10 mg tablet--take 1 daily as needed for allergy or itching.  Triamcinolone cream--apply 2 times daily to the rash area until better, about 10 to 14 days.

## 2022-08-30 NOTE — ED Triage Notes (Signed)
Pt states she has rash on her right leg she hasn't been using nothing on it.    She states she started having left ear pain last night she hasn't taken any meds.

## 2022-08-30 NOTE — ED Provider Notes (Signed)
[facility]    CSN: [id number] Arrival date & time: 08/30/22  1008      History   Chief Complaint Chief Complaint  Patient presents with   Rash   Otalgia    HPI [name] is a 52 y.o. female.    Rash Otalgia Associated symptoms: rash    Here for left ear pain last night, and it's actually some better today  Has a bilateral leg rash. Has a history of eczema. Is itchy, and not painful. No dc.  She is not had any creams to use for her eczema as she is in between primary care providers due to insurance concerns.  No fever or chills.  She has had cough about 2 days ago.  Maybe some itching in the left ear.  Past medical history significant for diabetes, and her last A1c was 9.5.  Last renal function was normal with an EGFR of 69.  Listed allergies include tramadol, hydrocodone, and Past Medical History:  Diagnosis Date   Back pain    Chronic kidney disease    kidney stones   History of kidney stones    PONV (postoperative nausea and vomiting)    Poorly controlled type 2 diabetes mellitus (HCC)     Patient Active Problem List   Diagnosis Date Noted   Obesity (BMI 30-39.9) 06/02/2022   Tension headache 03/24/2022   Uncontrolled type 2 diabetes mellitus with hyperglycemia (HCC) 03/04/2022   Hypertension associated with diabetes (HCC) 03/04/2022   Rash 03/04/2022   IUD (intrauterine device) in place 11/14/2020   Encounter for orthopedic follow-up care 11/08/2020   Carpal tunnel syndrome of right wrist 08/28/2020   Pain in right hand 08/28/2020   Lumbar pain 12/26/2019   Elevated blood pressure reading without diagnosis of hypertension 07/26/2019   Prediabetes 07/21/2018   Serum cholesterol elevated 07/21/2018   Pain in thoracic spine 06/22/2012   Lumbago 06/22/2012    Past Surgical History:  Procedure Laterality Date   CHOLECYSTECTOMY, LAPAROSCOPIC     CYSTOSCOPY WITH URETEROSCOPY AND STENT PLACEMENT Bilateral 07/29/2012   Procedure:  CYSTOSCOPY WITH BILATERAL URETEROSCOPY AND BILATERAL STENT PLACEMENT, BASKET EXTRACTION OF BILATERAL URETERAL STONES;  Surgeon: Sebastian Ache, MD;  Location: [facility];  Service: Urology;  Laterality: Bilateral;   EXTRACORPOREAL SHOCK WAVE LITHOTRIPSY Right 08/12/2020   Procedure: RIGHT EXTRACORPOREAL SHOCK WAVE LITHOTRIPSY (ESWL);  Surgeon: Rene Paci, MD;  Location: [facility];  Service: Urology;  Laterality: Right;   HAND SURGERY  10/29/2020    OB History     Gravida  4   Para  1   Term  1   Preterm  0   AB  3   Living         SAB  0   IAB  3   Ectopic  0   Multiple      Live Births               Home Medications    Prior to Admission medications   Medication Sig Start Date End Date Taking? Authorizing Provider  cetirizine (ZYRTEC ALLERGY) 10 MG tablet Take 1 tablet (10 mg total) by mouth daily as needed for allergies. 08/30/22  Yes Zenia Resides, MD  glipiZIDE (GLUCOTROL) 5 MG tablet TAKE 1 TABLET (5 MG TOTAL) BY MOUTH TWICE A DAY BEFORE MEALS 05/11/22  Yes Eden Emms, NP  ibuprofen (ADVIL) 800 MG tablet Take 1 tablet (800 mg total) by mouth every 8 (eight) hours  as needed (pain). 08/30/22  Yes Zenia Resides, MD  metFORMIN (GLUCOPHAGE) 500 MG tablet TAKE 2 TABLETS (1,000MG ) IN THE MORNING AND 1 TABLET (500MG ) IN THE EVENING 05/26/22  Yes Eden Emms, NP  Semaglutide,0.25 or 0.5MG /DOS, (OZEMPIC, 0.25 OR 0.5 MG/DOSE,) 2 MG/3ML SOPN Inject 0.5 mg into the skin once a week. 07/27/22  Yes Eden Emms, NP  triamcinolone cream (KENALOG) 0.1 % Apply 1 Application topically 2 (two) times daily. To affected area till better 08/30/22  Yes Allegra Cerniglia, Janace Aris, MD  acetaminophen (TYLENOL) 500 MG tablet Take 2 tablets (1,000 mg total) by mouth every 8 (eight) hours as needed for mild pain or moderate pain. Patient not taking: Reported on 06/02/2022 01/15/22   Guy Sandifer L, PA  glucose blood test strip Daily strip use 03/24/22   Eden Emms, NP  levonorgestrel (MIRENA) 20 MCG/24HR IUD 1 each by Intrauterine route once.    [provider]  famotidine (PEPCID) 40 MG tablet Take 1 tablet (40 mg total) by mouth daily. 02/12/17 04/14/19  Ofilia Neas, PA-C  levocetirizine (XYZAL) 5 MG tablet Take 1 tablet (5 mg total) by mouth every evening. 02/12/17 04/14/19  Ofilia Neas, PA-C  SUMAtriptan (IMITREX) 25 MG tablet Take 1 tablet (25 mg total) by mouth every 2 (two) hours as needed for migraine. May repeat in 2 hours if headache persists or recurs. 07/22/18 04/14/19  Janeece Agee, NP    Family History Family History  Problem Relation Age of Onset   Other Neg Hx    Colon cancer Neg Hx    Colon polyps Neg Hx    Esophageal cancer Neg Hx    Rectal cancer Neg Hx    Stomach cancer Neg Hx     Social History Social History   Tobacco Use   Smoking status: Never   Smokeless tobacco: Never  Vaping Use   Vaping status: Never Used  Substance Use Topics   Alcohol use: No   Drug use: No     Allergies   Tramadol, Hydrocodone-acetaminophen, and Flomax [tamsulosin hcl]   Review of Systems Review of Systems  HENT:  Positive for ear pain.   Skin:  Positive for rash.     Physical Exam Triage Vital Signs ED Triage Vitals  Encounter Vitals Group     BP 08/30/22 1053 121/80     Systolic BP Percentile --      Diastolic BP Percentile --      Pulse Rate 08/30/22 1053 96     Resp 08/30/22 1053 18     Temp 08/30/22 1053 98.2 F (36.8 C)     Temp Source 08/30/22 1053 Oral     SpO2 08/30/22 1053 96 %     Weight --      Height --      Head Circumference --      Peak Flow --      Pain Score 08/30/22 1052 5     Pain Loc --      Pain Education --      Exclude from Growth Chart --    No data found.  Updated Vital Signs BP 121/80 (BP Location: Right Arm)   Pulse 96   Temp 98.2 F (36.8 C) (Oral)   Resp 18   SpO2 96%   Visual Acuity Right Eye Distance:   Left Eye Distance:   Bilateral Distance:     Right Eye Near:   Left Eye Near:    Bilateral  Near:     Physical Exam Vitals reviewed.  Constitutional:      General: She is not in acute distress.    Appearance: She is not ill-appearing, toxic-appearing or diaphoretic.  HENT:     Right Ear: Tympanic membrane, ear canal and external ear normal.     Left Ear: Tympanic membrane, ear canal and external ear normal.     Ears:     Comments: Bilaterally tympanic membrane's are gray and shiny and translucent.  The canals are normal and not swollen.  There is no discharge or erythema or swelling.  There is no pain on traction of either pinnae.  There is no swelling around the pinnae    Nose: Nose normal.     Mouth/Throat:     Mouth: Mucous membranes are moist.     Pharynx: No oropharyngeal exudate or posterior oropharyngeal erythema.  Eyes:     Extraocular Movements: Extraocular movements intact.     Conjunctiva/sclera: Conjunctivae normal.     Pupils: Pupils are equal, round, and reactive to light.  Cardiovascular:     Rate and Rhythm: Normal rate and regular rhythm.     Heart sounds: No murmur heard. Pulmonary:     Effort: Pulmonary effort is normal.     Breath sounds: Normal breath sounds.  Musculoskeletal:     Cervical back: Neck supple.  Lymphadenopathy:     Cervical: No cervical adenopathy.  Skin:    Coloration: Skin is not pale.     Comments: There is a bumpy dusky erythematous rash on both shins.  It is more extensive on the right anterior lower leg  Neurological:     General: No focal deficit present.     Mental Status: She is alert and oriented to person, place, and time.  Psychiatric:        Behavior: Behavior normal.      UC Treatments / Results  Labs (all labs ordered are listed, but only abnormal results are displayed) Labs Reviewed - No data to display  EKG   Radiology No results found.  Procedures Procedures (including critical care time)  Medications Ordered in UC Medications - No data to  display  Initial Impression / Assessment and Plan / UC Course  I have reviewed the triage vital signs and the nursing notes.  Pertinent labs & imaging results that were available during my care of the patient were reviewed by me and considered in my medical decision making (see chart for details).        Her ear exam is normal.  There is possibly some eustachian tube dysfunction going on.  Ibuprofen and Zyrtec are sent in for pain and for possible allergic rhinitis that might cause eustachian tube dysfunction.  Triamcinolone cream is sent in for the rash/eczema Final Clinical Impressions(s) / UC Diagnoses   Final diagnoses:  Dermatitis  Left ear pain     Discharge Instructions      Take ibuprofen 800 mg--1 tab every 8 hours as needed for pain.  Zyrtec/cetirizine 10 mg tablet--take 1 daily as needed for allergy or itching.  Triamcinolone cream--apply 2 times daily to the rash area until better, about 10 to 14 days.      ED Prescriptions     Medication Sig Dispense Auth. Provider   ibuprofen (ADVIL) 800 MG tablet Take 1 tablet (800 mg total) by mouth every 8 (eight) hours as needed (pain). 21 tablet Zenia Resides, MD   cetirizine (ZYRTEC ALLERGY) 10 MG tablet Take 1  tablet (10 mg total) by mouth daily as needed for allergies. 30 tablet Zenia Resides, MD   triamcinolone cream (KENALOG) 0.1 % Apply 1 Application topically 2 (two) times daily. To affected area till better 80 g Marlinda Mike Janace Aris, MD      I have reviewed the PDMP during this encounter.   Zenia Resides, MD 08/30/22 [phone]

## 2022-09-02 ENCOUNTER — Ambulatory Visit (INDEPENDENT_AMBULATORY_CARE_PROVIDER_SITE_OTHER): Payer: Medicaid Other | Admitting: Nurse Practitioner

## 2022-09-02 ENCOUNTER — Encounter: Payer: Self-pay | Admitting: Nurse Practitioner

## 2022-09-02 ENCOUNTER — Encounter: Payer: Self-pay | Admitting: *Deleted

## 2022-09-02 VITALS — BP 122/80 | HR 92 | Temp 97.9°F | Ht 59.0 in | Wt 174.4 lb

## 2022-09-02 DIAGNOSIS — R21 Rash and other nonspecific skin eruption: Secondary | ICD-10-CM

## 2022-09-02 DIAGNOSIS — Z7984 Long term (current) use of oral hypoglycemic drugs: Secondary | ICD-10-CM

## 2022-09-02 DIAGNOSIS — Z7985 Long-term (current) use of injectable non-insulin antidiabetic drugs: Secondary | ICD-10-CM

## 2022-09-02 DIAGNOSIS — E1165 Type 2 diabetes mellitus with hyperglycemia: Secondary | ICD-10-CM

## 2022-09-02 LAB — POCT GLYCOSYLATED HEMOGLOBIN (HGB A1C): Hemoglobin A1C: 8.3 % — AB (ref 4.0–5.6)

## 2022-09-02 MED ORDER — SEMAGLUTIDE (1 MG/DOSE) 4 MG/3ML ~~LOC~~ SOPN
1.0000 mg | PEN_INJECTOR | SUBCUTANEOUS | 0 refills | Status: AC
Start: 2022-09-02 — End: ?

## 2022-09-02 NOTE — Assessment & Plan Note (Signed)
Patient currently maintained on Ozempic, metformin, glipizide.  Patient's A1c is still trending down.  Tolerating medications well we will increase Ozempic from 0.5 mg once weekly to 1 mg once weekly.  New prescription sent in today.  Patient to continue checking glucose at home and working on lifestyle modifications

## 2022-09-02 NOTE — Patient Instructions (Signed)
Nice to see you today I have re-referred you to the dermatologist Follow up with me in 3 months for an A1C recheck  Your A1C today was 8.3%

## 2022-09-02 NOTE — Assessment & Plan Note (Signed)
Patient needs a new referral to dermatology.  States last time though something with her insurance but she has since corrected it.  Referral placed today

## 2022-09-02 NOTE — Progress Notes (Signed)
Established Patient Office Visit  Subjective   Patient ID: [name], female    DOB: 07-Nov-1970  Age: 51 y.o. MRN: [id number]  Chief Complaint  Patient presents with   Diabetes     DM2: patient is currently on metformin 1500mg  daily, glipizide 5mg  BID and ozempic 0.5mg  once a week. States that she does not check her sugar regularly only when she feels off at the last office visit States that she has been checking 1-2 times a week and they have been 96-175.  States that she is walking her dog for her exercise.   States that she was referred to a pain clinic and she was going to go but there was something with the insurance. She is going to call and rescheduled  She needs a new referral for the dermatologist    Review of Systems  Constitutional:  Negative for chills and fever.  Respiratory:  Negative for shortness of breath.   Cardiovascular:  Negative for chest pain.  Gastrointestinal:  Negative for abdominal pain, constipation, diarrhea, nausea and vomiting.  Neurological:  Negative for headaches.      Objective:     BP 122/80   Pulse 92   Temp 97.9 F (36.6 C) (Temporal)   Ht 4\' 11"  (1.499 m)   Wt 174 lb 6.4 oz (79.1 kg)   SpO2 95%   BMI 35.22 kg/m  BP Readings from Last 3 Encounters:  09/02/22 122/80  08/30/22 121/80  06/02/22 136/82   Wt Readings from Last 3 Encounters:  09/02/22 174 lb 6.4 oz (79.1 kg)  06/02/22 178 lb 4 oz (80.9 kg)  05/08/22 176 lb (79.8 kg)      Physical Exam Vitals and nursing note reviewed.  Constitutional:      Appearance: Normal appearance.  Cardiovascular:     Rate and Rhythm: Normal rate and regular rhythm.     Pulses:          Dorsalis pedis pulses are 2+ on the right side and 2+ on the left side.     Heart sounds: Normal heart sounds.  Pulmonary:     Effort: Pulmonary effort is normal.     Breath sounds: Normal breath sounds.  Abdominal:     General: Bowel sounds are normal.  Musculoskeletal:     Right lower  leg: No edema.     Left lower leg: No edema.  Feet:     Right foot:     Skin integrity: Skin integrity normal.     Toenail Condition: Right toenails are normal.     Left foot:     Skin integrity: Skin integrity normal.     Toenail Condition: Left toenails are normal.  Skin:         Comments: Papular/nodular lesions that are darker colored and patient skin  Neurological:     Mental Status: She is alert.      Results for orders placed or performed in visit on 09/02/22  POCT glycosylated hemoglobin (Hb A1C)  Result Value Ref Range   Hemoglobin A1C 8.3 (A) 4.0 - 5.6 %   HbA1c POC (<> result, manual entry)     HbA1c, POC (prediabetic range)     HbA1c, POC (controlled diabetic range)        The ASCVD Risk score (Arnett DK, et al., 2019) failed to calculate for the following reasons:   Cannot find a previous HDL lab   Cannot find a previous total cholesterol lab    Assessment &  Plan:   Problem List Items Addressed This Visit       Endocrine   Uncontrolled type 2 diabetes mellitus with hyperglycemia (HCC) - Primary    Patient currently maintained on Ozempic, metformin, glipizide.  Patient's A1c is still trending down.  Tolerating medications well we will increase Ozempic from 0.5 mg once weekly to 1 mg once weekly.  New prescription sent in today.  Patient to continue checking glucose at home and working on lifestyle modifications      Relevant Medications   Semaglutide, 1 MG/DOSE, 4 MG/3ML SOPN   Other Relevant Orders   POCT glycosylated hemoglobin (Hb A1C) (Completed)     Musculoskeletal and Integument   Rash    Patient needs a new referral to dermatology.  States last time though something with her insurance but she has since corrected it.  Referral placed today      Relevant Orders   Ambulatory referral to Dermatology    Return in about 3 months (around 12/03/2022) for DM recheck.    Audria Nine, NP

## 2022-09-06 DIAGNOSIS — Z419 Encounter for procedure for purposes other than remedying health state, unspecified: Secondary | ICD-10-CM | POA: Diagnosis not present

## 2022-09-14 ENCOUNTER — Other Ambulatory Visit (HOSPITAL_COMMUNITY): Payer: Self-pay

## 2022-09-14 ENCOUNTER — Telehealth: Payer: Self-pay

## 2022-09-14 NOTE — Telephone Encounter (Signed)
Pharmacy Patient Advocate Encounter   Received notification from CoverMyMeds that prior authorization for Ozempic 4mg /69ml is required/requested.   Insurance verification completed.   The patient is insured through  [facility]  .   Per test claim: PA required; PA submitted to [facility] Medicaid via CoverMyMeds Key/confirmation #/EOC [facility] Status is pending

## 2022-09-15 ENCOUNTER — Telehealth: Payer: Self-pay | Admitting: Nurse Practitioner

## 2022-09-15 ENCOUNTER — Other Ambulatory Visit: Payer: Self-pay | Admitting: Nurse Practitioner

## 2022-09-15 ENCOUNTER — Other Ambulatory Visit (HOSPITAL_COMMUNITY): Payer: Self-pay

## 2022-09-15 DIAGNOSIS — R21 Rash and other nonspecific skin eruption: Secondary | ICD-10-CM

## 2022-09-15 DIAGNOSIS — E119 Type 2 diabetes mellitus without complications: Secondary | ICD-10-CM | POA: Diagnosis not present

## 2022-09-15 DIAGNOSIS — E1165 Type 2 diabetes mellitus with hyperglycemia: Secondary | ICD-10-CM

## 2022-09-15 LAB — HM DIABETES EYE EXAM

## 2022-09-15 NOTE — Telephone Encounter (Signed)
Called pt to inform of PA approval for ozempic. Pt has no questions or concerns.

## 2022-09-15 NOTE — Telephone Encounter (Signed)
Referral placed.

## 2022-09-15 NOTE — Telephone Encounter (Signed)
Pt called requesting for [employer] to submit another Dermatology referral for her? Pt states the referred office of Dr. Terri Piedra called her stating they don't accept her insurance. Call back # [phone]

## 2022-09-15 NOTE — Addendum Note (Signed)
Addended by: Eden Emms on: 09/15/2022 01:38 PM   Modules accepted: Orders

## 2022-09-15 NOTE — Telephone Encounter (Signed)
Pharmacy Patient Advocate Encounter  Received notification from [facility] Medicaid that Prior Authorization for Ozempic (1 MG/DOSE) 4MG /3ML pen-injectors has been APPROVED from 08-31-2022 to 09-14-2023   PA #/Case ID/Reference #: [id number]

## 2022-10-05 ENCOUNTER — Telehealth: Payer: Self-pay | Admitting: Nurse Practitioner

## 2022-10-05 DIAGNOSIS — E1165 Type 2 diabetes mellitus with hyperglycemia: Secondary | ICD-10-CM

## 2022-10-05 MED ORDER — SEMAGLUTIDE (1 MG/DOSE) 4 MG/3ML ~~LOC~~ SOPN: 1.0000 mg | PEN_INJECTOR | SUBCUTANEOUS | 0 refills | Status: DC

## 2022-10-05 NOTE — Addendum Note (Signed)
Addended by: Eden Emms on: 10/05/2022 01:31 PM   Modules accepted: Orders

## 2022-10-05 NOTE — Telephone Encounter (Signed)
Prescription Request  10/05/2022  LOV: 09/02/2022  What is the name of the medication or equipment? Semaglutide, 1 MG/DOSE, 4 MG/3ML SOPN   Have you contacted your pharmacy to request a refill? No   Which pharmacy would you like this sent to?  [name], [place] - [name] [place] [address] Phone: [phone] Fax: [phone]     Patient notified that their request is being sent to the clinical staff for review and that they should receive a response within 2 business days.   Please advise at Mobile [phone] (mobile)

## 2022-10-05 NOTE — Telephone Encounter (Signed)
Refill provided

## 2022-10-06 DIAGNOSIS — Z419 Encounter for procedure for purposes other than remedying health state, unspecified: Secondary | ICD-10-CM | POA: Diagnosis not present

## 2022-10-13 DIAGNOSIS — R200 Anesthesia of skin: Secondary | ICD-10-CM | POA: Diagnosis not present

## 2022-10-13 DIAGNOSIS — G8929 Other chronic pain: Secondary | ICD-10-CM | POA: Diagnosis not present

## 2022-10-13 DIAGNOSIS — M545 Low back pain, unspecified: Secondary | ICD-10-CM | POA: Diagnosis not present

## 2022-10-13 DIAGNOSIS — M47816 Spondylosis without myelopathy or radiculopathy, lumbar region: Secondary | ICD-10-CM | POA: Diagnosis not present

## 2022-10-13 DIAGNOSIS — M5451 Vertebrogenic low back pain: Secondary | ICD-10-CM | POA: Diagnosis not present

## 2022-10-29 ENCOUNTER — Ambulatory Visit: Payer: Medicaid Other | Admitting: Nurse Practitioner

## 2022-10-29 ENCOUNTER — Encounter: Payer: Self-pay | Admitting: Nurse Practitioner

## 2022-10-29 ENCOUNTER — Telehealth: Payer: Self-pay | Admitting: Nurse Practitioner

## 2022-10-29 VITALS — BP 106/78 | HR 98 | Temp 97.8°F | Ht 59.0 in | Wt 169.4 lb

## 2022-10-29 DIAGNOSIS — L989 Disorder of the skin and subcutaneous tissue, unspecified: Secondary | ICD-10-CM | POA: Diagnosis not present

## 2022-10-29 MED ORDER — TRIAMCINOLONE ACETONIDE 0.1 % EX CREA
1.0000 | TOPICAL_CREAM | Freq: Two times a day (BID) | CUTANEOUS | 0 refills | Status: DC
Start: 2022-10-29 — End: 2022-12-08

## 2022-10-29 MED ORDER — CETIRIZINE HCL 10 MG PO TABS
10.0000 mg | ORAL_TABLET | Freq: Every day | ORAL | 0 refills | Status: DC | PRN
Start: 2022-10-29 — End: 2022-11-20

## 2022-10-29 NOTE — Assessment & Plan Note (Signed)
Will use triamcinolone 0.1% cream twice daily for 7 to 10 days.  Topical steroid precautions reviewed.  Patient states her dermatologist did not accept insurance and was not accepting new patients.  Will get referral changed also start cetirizine nightly.  Patient informed to use tepid water versus hot water and pat dry versus scrub dry

## 2022-10-29 NOTE — Telephone Encounter (Signed)
States the derm that we referred to was not accepting her insurance. Can we find one that does?

## 2022-10-29 NOTE — Progress Notes (Signed)
Established Patient Office Visit  Subjective   Patient ID: [name], female    DOB: 1970-07-14  Age: 52 y.o. MRN: [id number]  Chief Complaint  Patient presents with   Eczema    On both lower legs. States that it only gets itchy when she touches the area.    Referral    Needs new dermatology due to insurance.     HPI  Skin lesions: patient has been seen for this in the past and she has been referred to dermatology. Most recent dermatologist did not accept the patients insurance  States that she did go to UC and got a cream that helped. States that it did help. States she will take hot showers and it will feel good but itch.     Review of Systems  Constitutional:  Negative for chills and fever.  Respiratory:  Negative for shortness of breath.   Cardiovascular:  Negative for chest pain.  Skin:  Positive for itching and rash.      Objective:     BP 106/78   Pulse 98   Temp 97.8 F (36.6 C) (Oral)   Ht 4\' 11"  (1.499 m)   Wt 169 lb 6.4 oz (76.8 kg)   SpO2 95%   BMI 34.21 kg/m  BP Readings from Last 3 Encounters:  10/29/22 106/78  09/02/22 122/80  08/30/22 121/80   Wt Readings from Last 3 Encounters:  10/29/22 169 lb 6.4 oz (76.8 kg)  09/02/22 174 lb 6.4 oz (79.1 kg)  06/02/22 178 lb 4 oz (80.9 kg)   SpO2 Readings from Last 3 Encounters:  10/29/22 95%  09/02/22 95%  08/30/22 96%      Physical Exam Vitals and nursing note reviewed.  Constitutional:      Appearance: Normal appearance.  Cardiovascular:     Rate and Rhythm: Normal rate and regular rhythm.     Heart sounds: Normal heart sounds.  Pulmonary:     Effort: Pulmonary effort is normal.     Breath sounds: Normal breath sounds.  Skin:    Findings: Lesion present.  Neurological:     Mental Status: She is alert.      No results found for any visits on 10/29/22.    The ASCVD Risk score (Arnett DK, et al., 2019) failed to calculate for the following reasons:   Cannot find a previous HDL  lab   Cannot find a previous total cholesterol lab    Assessment & Plan:   Problem List Items Addressed This Visit       Musculoskeletal and Integument   Skin lesion - Primary    Will use triamcinolone 0.1% cream twice daily for 7 to 10 days.  Topical steroid precautions reviewed.  Patient states her dermatologist did not accept insurance and was not accepting new patients.  Will get referral changed also start cetirizine nightly.  Patient informed to use tepid water versus hot water and pat dry versus scrub dry      Relevant Medications   cetirizine (ZYRTEC ALLERGY) 10 MG tablet   triamcinolone cream (KENALOG) 0.1 %    Return if symptoms worsen or fail to improve, for As scheduled .    Audria Nine, NP

## 2022-10-29 NOTE — Patient Instructions (Signed)
Nice to see you today I have sent the cream in  Take luke warm showers/baths Pat the area dry vs scrub it

## 2022-11-02 ENCOUNTER — Telehealth: Payer: Self-pay | Admitting: Nurse Practitioner

## 2022-11-02 DIAGNOSIS — E1165 Type 2 diabetes mellitus with hyperglycemia: Secondary | ICD-10-CM

## 2022-11-02 MED ORDER — SEMAGLUTIDE (1 MG/DOSE) 4 MG/3ML ~~LOC~~ SOPN: 1.0000 mg | PEN_INJECTOR | SUBCUTANEOUS | 0 refills | Status: DC

## 2022-11-02 NOTE — Telephone Encounter (Signed)
Prescription Request  11/02/2022  LOV: 10/29/2022  What is the name of the medication or equipment? Semaglutide, 1 MG/DOSE, 4 MG/3ML SOPN   Have you contacted your pharmacy to request a refill? Yes   Which pharmacy would you like this sent to?  [name], [place] - [name] [place] [address] Phone: [phone] Fax: [phone]   Patient notified that their request is being sent to the clinical staff for review and that they should receive a response within 2 business days.   Please advise at Mobile [phone] (mobile)

## 2022-11-02 NOTE — Telephone Encounter (Signed)
Refill provided

## 2022-11-06 DIAGNOSIS — Z419 Encounter for procedure for purposes other than remedying health state, unspecified: Secondary | ICD-10-CM | POA: Diagnosis not present

## 2022-11-10 ENCOUNTER — Other Ambulatory Visit (HOSPITAL_COMMUNITY)
Admission: RE | Admit: 2022-11-10 | Discharge: 2022-11-10 | Disposition: A | Discharge status: Home / Self Care | Payer: Medicaid Other | Source: Ambulatory Visit | Attending: Obstetrics & Gynecology | Admitting: Obstetrics & Gynecology

## 2022-11-10 ENCOUNTER — Encounter: Payer: Self-pay | Admitting: Obstetrics & Gynecology

## 2022-11-10 ENCOUNTER — Ambulatory Visit: Payer: Medicaid Other | Admitting: Obstetrics & Gynecology

## 2022-11-10 VITALS — BP 137/85 | HR 111 | Wt 169.0 lb

## 2022-11-10 DIAGNOSIS — Z975 Presence of (intrauterine) contraceptive device: Secondary | ICD-10-CM

## 2022-11-10 DIAGNOSIS — Z01419 Encounter for gynecological examination (general) (routine) without abnormal findings: Secondary | ICD-10-CM | POA: Insufficient documentation

## 2022-11-10 NOTE — Progress Notes (Signed)
GYNECOLOGY ANNUAL PREVENTATIVE CARE ENCOUNTER NOTE  History:     [name] is a 52 y.o. [phone] female here for a routine annual gynecologic exam.  Current complaints: decreased libido.   Denies abnormal vaginal bleeding, discharge, pelvic pain, problems with intercourse or other gynecologic concerns.    Gynecologic History No LMP recorded. (Menstrual status: IUD). Contraception: IUD, Liletta placed in 2019 Last Pap: 11/21/21. Result was normal with negative HPV Last Mammogram: 06/05/22.  Result was normal Last Colonoscopy: 05/08/22.  Result was normal  Obstetric History OB History  Gravida Para Term Preterm AB Living  4 1 1  0 3 1  SAB IAB Ectopic Multiple Live Births  0 3 0        # Outcome Date GA Lbr Len/2nd Weight Sex Type Anes PTL Lv  4 IAB           3 IAB           2 IAB           1 Term             Past Medical History:  Diagnosis Date   Back pain    Chronic kidney disease    kidney stones   History of kidney stones    PONV (postoperative nausea and vomiting)    Poorly controlled type 2 diabetes mellitus (HCC)     Past Surgical History:  Procedure Laterality Date   CHOLECYSTECTOMY, LAPAROSCOPIC     CYSTOSCOPY WITH URETEROSCOPY AND STENT PLACEMENT Bilateral 07/29/2012   Procedure: CYSTOSCOPY WITH BILATERAL URETEROSCOPY AND BILATERAL STENT PLACEMENT, BASKET EXTRACTION OF BILATERAL URETERAL STONES;  Surgeon: Sebastian Ache, MD;  Location: [facility];  Service: Urology;  Laterality: Bilateral;   EXTRACORPOREAL SHOCK WAVE LITHOTRIPSY Right 08/12/2020   Procedure: RIGHT EXTRACORPOREAL SHOCK WAVE LITHOTRIPSY (ESWL);  Surgeon: Rene Paci, MD;  Location: [facility];  Service: Urology;  Laterality: Right;   HAND SURGERY  10/29/2020    Current Outpatient Medications on File Prior to Visit  Medication Sig Dispense Refill   glipiZIDE (GLUCOTROL) 5 MG tablet TAKE 1 TABLET (5 MG TOTAL) BY MOUTH TWICE A DAY BEFORE MEALS 180 tablet 1   glucose  blood test strip Daily strip use 100 each 12   levonorgestrel (MIRENA) 20 MCG/24HR IUD 1 each by Intrauterine route once.     metFORMIN (GLUCOPHAGE) 500 MG tablet TAKE 2 TABLETS (1,000MG ) IN THE MORNING AND 1 TABLET (500MG ) IN THE EVENING 270 tablet 1   Semaglutide, 1 MG/DOSE, 4 MG/3ML SOPN Inject 1 mg as directed once a week. 9 mL 0   triamcinolone cream (KENALOG) 0.1 % Apply 1 Application topically 2 (two) times daily. To affected area for 10 days 80 g 0   valsartan-hydrochlorothiazide (DIOVAN-HCT) 160-12.5 MG tablet Take 1 tablet by mouth daily.     acetaminophen (TYLENOL) 500 MG tablet Take 2 tablets (1,000 mg total) by mouth every 8 (eight) hours as needed for mild pain or moderate pain. (Patient not taking: Reported on 06/02/2022) 30 tablet 0   betamethasone dipropionate 0.05 % cream SMARTSIG:Liberally Topical Every Night (Patient not taking: Reported on 11/10/2022)     cetirizine (ZYRTEC ALLERGY) 10 MG tablet Take 1 tablet (10 mg total) by mouth daily as needed for allergies. (Patient not taking: Reported on 11/10/2022) 30 tablet 0   ibuprofen (ADVIL) 800 MG tablet Take 1 tablet (800 mg total) by mouth every 8 (eight) hours as needed (pain). (Patient not taking: Reported on 10/29/2022) 21 tablet  0   [DISCONTINUED] famotidine (PEPCID) 40 MG tablet Take 1 tablet (40 mg total) by mouth daily. 30 tablet 3   [DISCONTINUED] levocetirizine (XYZAL) 5 MG tablet Take 1 tablet (5 mg total) by mouth every evening. 30 tablet 3   [DISCONTINUED] SUMAtriptan (IMITREX) 25 MG tablet Take 1 tablet (25 mg total) by mouth every 2 (two) hours as needed for migraine. May repeat in 2 hours if headache persists or recurs. 10 tablet 2   No current facility-administered medications on file prior to visit.    Allergies  Allergen Reactions   Tramadol Hives, Itching and Rash   Hydrocodone-Acetaminophen Itching   Flomax [Tamsulosin Hcl] Palpitations    headache    Social History:  reports that she has never smoked.  She has never used smokeless tobacco. She reports that she does not drink alcohol and does not use drugs.  Family History  Problem Relation Age of Onset   Other Neg Hx    Colon cancer Neg Hx    Colon polyps Neg Hx    Esophageal cancer Neg Hx    Rectal cancer Neg Hx    Stomach cancer Neg Hx     The following portions of the patient's history were reviewed and updated as appropriate: allergies, current medications, past family history, past medical history, past social history, past surgical history and problem list.  Review of Systems Pertinent items noted in HPI and remainder of comprehensive ROS otherwise negative.  Physical Exam:  BP (!) 167/94   Pulse (!) 116   Wt 169 lb (76.7 kg)   BMI 34.13 kg/m  CONSTITUTIONAL: Well-developed, well-nourished female in no acute distress.  HENT:  Normocephalic, atraumatic, External right and left ear normal.  EYES: Conjunctivae and EOM are normal. Pupils are equal, round, and reactive to light. No scleral icterus.  NECK: Normal range of motion, supple, no masses.  Normal thyroid.  SKIN: Skin is warm and dry. No rash noted. Not diaphoretic. No erythema. No pallor. MUSCULOSKELETAL: Normal range of motion. No tenderness.  No cyanosis, clubbing, or edema. NEUROLOGIC: Alert and oriented to person, place, and time. Normal reflexes, muscle tone coordination.  PSYCHIATRIC: Normal mood and affect. Normal behavior. Normal judgment and thought content. CARDIOVASCULAR: Normal heart rate noted, regular rhythm RESPIRATORY: Clear to auscultation bilaterally. Effort and breath sounds normal, no problems with respiration noted. BREASTS: Symmetric in size. No masses, tenderness, skin changes, nipple drainage, or lymphadenopathy bilaterally. Performed in the presence of a chaperone. ABDOMEN: Soft, no distention noted.  No tenderness, rebound or guarding.  PELVIC: Normal appearing external genitalia and urethral meatus; normal appearing vaginal mucosa and cervix.  IUD strings not seen.  No abnormal vaginal discharge noted.  Pap smear obtained.  Normal uterine size, no other palpable masses, no uterine or adnexal tenderness.  Performed in the presence of a chaperone.   Assessment and Plan:    1. IUD (intrauterine device) in place Due for removal in 2027.  2. Well woman exam with routine gynecological exam - Cytology - PAP Will follow up results of pap smear and manage accordingly. Mammogram and colon cancer screening up to date. Discussed menopausal effects on libido briefly, patient will come back to discuss management if desired. Routine preventative health maintenance measures emphasized. Please refer to After Visit Summary for other counseling recommendations.      Jaynie Collins, MD, FACOG Obstetrician & Gynecologist, [facility] for [employer], [facility]

## 2022-11-13 LAB — CYTOLOGY - PAP
Comment: NEGATIVE
Diagnosis: NEGATIVE
High risk HPV: NEGATIVE

## 2022-11-20 ENCOUNTER — Other Ambulatory Visit: Payer: Self-pay | Admitting: Nurse Practitioner

## 2022-11-20 DIAGNOSIS — L989 Disorder of the skin and subcutaneous tissue, unspecified: Secondary | ICD-10-CM

## 2022-12-06 DIAGNOSIS — Z419 Encounter for procedure for purposes other than remedying health state, unspecified: Secondary | ICD-10-CM | POA: Diagnosis not present

## 2022-12-07 ENCOUNTER — Ambulatory Visit: Payer: Medicaid Other | Admitting: Nurse Practitioner

## 2022-12-08 ENCOUNTER — Other Ambulatory Visit: Payer: Self-pay | Admitting: Nurse Practitioner

## 2022-12-08 ENCOUNTER — Encounter: Payer: Self-pay | Admitting: Nurse Practitioner

## 2022-12-08 ENCOUNTER — Telehealth: Payer: Self-pay | Admitting: Nurse Practitioner

## 2022-12-08 ENCOUNTER — Ambulatory Visit: Payer: Medicaid Other | Admitting: Nurse Practitioner

## 2022-12-08 VITALS — BP 128/82 | HR 96 | Temp 98.6°F | Ht 59.0 in | Wt 170.2 lb

## 2022-12-08 DIAGNOSIS — E1165 Type 2 diabetes mellitus with hyperglycemia: Secondary | ICD-10-CM

## 2022-12-08 DIAGNOSIS — U071 COVID-19: Secondary | ICD-10-CM | POA: Diagnosis not present

## 2022-12-08 DIAGNOSIS — Z7985 Long-term (current) use of injectable non-insulin antidiabetic drugs: Secondary | ICD-10-CM | POA: Diagnosis not present

## 2022-12-08 DIAGNOSIS — R051 Acute cough: Secondary | ICD-10-CM | POA: Diagnosis not present

## 2022-12-08 DIAGNOSIS — L989 Disorder of the skin and subcutaneous tissue, unspecified: Secondary | ICD-10-CM | POA: Diagnosis not present

## 2022-12-08 DIAGNOSIS — R0609 Other forms of dyspnea: Secondary | ICD-10-CM | POA: Insufficient documentation

## 2022-12-08 DIAGNOSIS — Z7984 Long term (current) use of oral hypoglycemic drugs: Secondary | ICD-10-CM | POA: Diagnosis not present

## 2022-12-08 DIAGNOSIS — R52 Pain, unspecified: Secondary | ICD-10-CM

## 2022-12-08 LAB — POC COVID19 BINAXNOW: SARS Coronavirus 2 Ag: POSITIVE — AB

## 2022-12-08 LAB — POCT FLU A/B STATUS
Influenza A, POC: NEGATIVE
Influenza B, POC: NEGATIVE

## 2022-12-08 LAB — POCT GLYCOSYLATED HEMOGLOBIN (HGB A1C): Hemoglobin A1C: 7.7 % — AB (ref 4.0–5.6)

## 2022-12-08 MED ORDER — NIRMATRELVIR/RITONAVIR (PAXLOVID)TABLET: 3.0000 | ORAL_TABLET | Freq: Two times a day (BID) | ORAL | 0 refills | Status: AC

## 2022-12-08 MED ORDER — ALBUTEROL SULFATE HFA 108 (90 BASE) MCG/ACT IN AERS: 2.0000 | INHALATION_SPRAY | Freq: Four times a day (QID) | RESPIRATORY_TRACT | 0 refills | Status: DC | PRN

## 2022-12-08 MED ORDER — TRIAMCINOLONE ACETONIDE 0.1 % EX CREA: 1.0000 | TOPICAL_CREAM | Freq: Two times a day (BID) | CUTANEOUS | 0 refills | Status: DC

## 2022-12-08 MED ORDER — SEMAGLUTIDE (2 MG/DOSE) 8 MG/3ML ~~LOC~~ SOPN: 2.0000 mg | PEN_INJECTOR | SUBCUTANEOUS | 1 refills | Status: DC

## 2022-12-08 NOTE — Assessment & Plan Note (Signed)
Flu test negative in office COVID test positive.  Will treat patient with Paxlovid.  CDC guidelines and recommendations regards to self-isolation reviewed.  Signs and symptoms reviewed when she is to be reevaluated medically

## 2022-12-08 NOTE — Progress Notes (Signed)
Established Patient Office Visit  Subjective   Patient ID: [name], female    DOB: 25-Mar-1970  Age: 52 y.o. MRN: [id number]  Chief Complaint  Patient presents with   Diabetes    Pt states that the 1mg  Ozempic is not working for her.    Medication Refill    Valsartan        DM2: patient is currently on glipizide 5 mg twice daily, semaglutide 1 mg once a week, 1500 mg of metformin daily. At last office visit she was titrated to 1mg  of ozempic. States that she is not losing. States that she is checking her suar at home. This morning is 128.  States that she is cheking it twice a week   HTN: Patient currently maintained on valsartan hydrochlorothiazide 160-12.5 mg daily  Sick symptoms: states started Thursday night. States that her husband was sick. States that he got the shingles vaccine and he went out with his grand daughter  Covid vaccine: UTD and booster Flu vaccine: not up to date Has been doing advil    Review of Systems  Constitutional:  Positive for chills, fever and malaise/fatigue.  HENT:  Positive for sore throat. Negative for ear discharge and ear pain.   Respiratory:  Positive for cough, sputum production and shortness of breath (doe).   Gastrointestinal:  Positive for nausea.  Musculoskeletal:  Positive for joint pain and myalgias.  Neurological:  Positive for dizziness and headaches.      Objective:     BP 128/82   Pulse 96   Temp 98.6 F (37 C) (Oral)   Ht 4\' 11"  (1.499 m)   Wt 170 lb 3.2 oz (77.2 kg)   SpO2 97%   BMI 34.38 kg/m  BP Readings from Last 3 Encounters:  12/08/22 128/82  11/10/22 137/85  10/29/22 106/78   Wt Readings from Last 3 Encounters:  12/08/22 170 lb 3.2 oz (77.2 kg)  11/10/22 169 lb (76.7 kg)  10/29/22 169 lb 6.4 oz (76.8 kg)   SpO2 Readings from Last 3 Encounters:  12/08/22 97%  10/29/22 95%  09/02/22 95%      Physical Exam Vitals and nursing note reviewed.  Constitutional:      Appearance: Normal  appearance.  HENT:     Right Ear: Tympanic membrane, ear canal and external ear normal.     Left Ear: Tympanic membrane, ear canal and external ear normal.     Mouth/Throat:     Mouth: Mucous membranes are moist.     Pharynx: Oropharynx is clear.  Cardiovascular:     Rate and Rhythm: Normal rate and regular rhythm.     Heart sounds: Normal heart sounds.  Pulmonary:     Effort: Pulmonary effort is normal.     Breath sounds: Wheezing (LUE anteriorly) present.  Abdominal:     General: Bowel sounds are normal.  Lymphadenopathy:     Cervical: No cervical adenopathy.  Neurological:     Mental Status: She is alert.      Results for orders placed or performed in visit on 12/08/22  POCT glycosylated hemoglobin (Hb A1C)  Result Value Ref Range   Hemoglobin A1C 7.7 (A) 4.0 - 5.6 %   HbA1c POC (<> result, manual entry)     HbA1c, POC (prediabetic range)     HbA1c, POC (controlled diabetic range)    POC COVID-19  Result Value Ref Range   SARS Coronavirus 2 Ag Positive (A) Negative  POCT Flu A & B Status  Result Value Ref Range   Influenza A, POC Negative Negative   Influenza B, POC Negative Negative      The ASCVD Risk score (Arnett DK, et al., 2019) failed to calculate for the following reasons:   Cannot find a previous HDL lab   Cannot find a previous total cholesterol lab    Assessment & Plan:   Problem List Items Addressed This Visit       Endocrine   Uncontrolled type 2 diabetes mellitus with hyperglycemia (HCC) - Primary    Patient currently maintained on glipizide 5 mg twice daily, metformin 1500 mg daily, Ozempic 1 mg once a week.  Patient's A1c is trending down we will increase Ozempic to 2 mg once a week with 40-month follow-up      Relevant Medications   Semaglutide, 2 MG/DOSE, 8 MG/3ML SOPN   Other Relevant Orders   POCT glycosylated hemoglobin (Hb A1C) (Completed)     Musculoskeletal and Integument   Skin lesion    Using topical steroid patient has been  using it a lot lately we reviewed topical steroid precautions.  Patient has an appointment with dermatology but not until March 2025      Relevant Medications   triamcinolone cream (KENALOG) 0.1 %     Other   Acute cough    Flu and COVID test in office.      Relevant Orders   POC COVID-19 (Completed)   POCT Flu A & B Status (Completed)   Body aches    Flu and COVID test in office.  Patient can continue using over-the-counter analgesics as needed      Relevant Orders   POC COVID-19 (Completed)   POCT Flu A & B Status (Completed)   COVID-19    Flu test negative in office COVID test positive.  Will treat patient with Paxlovid.  CDC guidelines and recommendations regards to self-isolation reviewed.  Signs and symptoms reviewed when she is to be reevaluated medically      Relevant Medications   nirmatrelvir/ritonavir (PAXLOVID) 20 x 150 MG & 10 x 100MG  TABS   DOE (dyspnea on exertion)    Albuterol inhaler as needed      Relevant Medications   albuterol (VENTOLIN HFA) 108 (90 Base) MCG/ACT inhaler    Return in about 3 months (around 03/08/2023) for DM recheck.    Audria Nine, NP

## 2022-12-08 NOTE — Assessment & Plan Note (Signed)
Using topical steroid patient has been using it a lot lately we reviewed topical steroid precautions.  Patient has an appointment with dermatology but not until March 2025

## 2022-12-08 NOTE — Assessment & Plan Note (Signed)
Albuterol inhaler as needed 

## 2022-12-08 NOTE — Assessment & Plan Note (Signed)
 Flu and COVID test in office.

## 2022-12-08 NOTE — Patient Instructions (Signed)
Nice to see you today Follow up with me in 3 months Quarantine until you are fever free for 24 hours with out the use of tylenol or ibuprofen

## 2022-12-08 NOTE — Telephone Encounter (Signed)
Can we call the pharmacy and see what alternative to albuterol they want  Also find out if she needs the PA for ozempic as she was on the 1mg  dose

## 2022-12-08 NOTE — Telephone Encounter (Signed)
LAST APPOINTMENT DATE: 12/08/2022   NEXT APPOINTMENT DATE: 03/08/2023  Valsartan-Hydrochlorothiazide   LAST REFILL: 09/15/2022  QTY: unknown

## 2022-12-08 NOTE — Telephone Encounter (Signed)
Contacted pt. Pt stated that yes, she will need a PA for Ozempic.  Contacted pharmacy on file. Pharmacy stated that a mistake was made and stated the brand name is good to use and will fill the prescription today.

## 2022-12-08 NOTE — Telephone Encounter (Signed)
Prescription Request  12/08/2022  LOV: 12/08/2022  What is the name of the medication or equipment? valsartan-hydrochlorothiazide (DIOVAN-HCT) 160-12.5 MG tablet   Have you contacted your pharmacy to request a refill? Yes   Which pharmacy would you like this sent to?  [name], [place] - [name] [place] [address] Phone: [phone] Fax: [phone]    Patient notified that their request is being sent to the clinical staff for review and that they should receive a response within 2 business days.   Please advise at Mobile [phone] (mobile)

## 2022-12-08 NOTE — Assessment & Plan Note (Signed)
Flu and COVID test in office.  Patient can continue using over-the-counter analgesics as needed

## 2022-12-08 NOTE — Assessment & Plan Note (Signed)
Patient currently maintained on glipizide 5 mg twice daily, metformin 1500 mg daily, Ozempic 1 mg once a week.  Patient's A1c is trending down we will increase Ozempic to 2 mg once a week with 42-month follow-up

## 2022-12-09 MED ORDER — VALSARTAN-HYDROCHLOROTHIAZIDE 160-12.5 MG PO TABS: 1.0000 | ORAL_TABLET | Freq: Every day | ORAL | 2 refills | Status: DC

## 2022-12-09 NOTE — Addendum Note (Signed)
Addended by: Eden Emms on: 12/09/2022 07:52 AM   Modules accepted: Orders

## 2022-12-09 NOTE — Telephone Encounter (Signed)
Refill provided

## 2022-12-25 ENCOUNTER — Telehealth: Payer: Self-pay | Admitting: *Deleted

## 2022-12-25 NOTE — Telephone Encounter (Signed)
Contacted pt.  Pt complains of being confused in regards to ozempic medication. Pt states that she has been without for 3 weeks.  Chart looked like medication was refused and/or prescribing error?  Pt is not sure if she is on 2mg  or 8mg  of Ozempic.

## 2022-12-25 NOTE — Telephone Encounter (Signed)
Copied from CRM [id number]. Topic: Clinical - Medication Question >> Dec 25, 2022 12:09 PM [name] wrote: Reason for CRM: Patient has some updates on some changes made to her ozempic medication. She states that they changed it to a different medication but she is not 100% sure of the situation.

## 2022-12-25 NOTE — Telephone Encounter (Signed)
We titrated patient up to 2 mg of Ozempic once a week.  Those pens come in 8 mg pens but she supposed to administer 2 mg once a week.

## 2022-12-28 ENCOUNTER — Telehealth: Payer: Self-pay | Admitting: *Deleted

## 2022-12-28 ENCOUNTER — Telehealth: Payer: Self-pay | Admitting: Nurse Practitioner

## 2022-12-28 ENCOUNTER — Ambulatory Visit: Payer: Self-pay | Admitting: Nurse Practitioner

## 2022-12-28 NOTE — Telephone Encounter (Signed)
Spoke with pt and advised her that with the type of insurance that she has, we would not be able to give her any vaccines. Advised her that she would have to call the health department to get this taken care of.

## 2022-12-28 NOTE — Telephone Encounter (Signed)
Please see other telephone encounter.

## 2022-12-28 NOTE — Telephone Encounter (Signed)
Spoke with patient and she stated that she had her 1st shingles on 04/02/2021 at [facility]. While speaking with Rena regarding if she would be ok to schedule, patient disconnected the call.

## 2022-12-28 NOTE — Telephone Encounter (Signed)
Copied from CRM [id number]. Topic: General - Other >> Dec 28, 2022  3:44 PM Turkey A wrote: Reason for CRM: Patient would like for Nurse to call her back to set up appt for her Shingles Immunization

## 2022-12-28 NOTE — Telephone Encounter (Signed)
Copied from CRM [id number]. Topic: General - Other >> Dec 28, 2022  3:19 PM Louie Casa B wrote: Reason for CRM: pt calling for shingles got Reason for Disposition  [1] Caller requesting NON-URGENT health information AND [2] PCP's office is the best resource  Answer Assessment - Initial Assessment Questions 1. REASON FOR CALL or QUESTION: "What is your reason for calling today?" or "How can I best help you?" or "What question do you have that I can help answer?"     Pt reporting that she had an appt scheduled for her shingles shot tomorrow but she had spoken with a nurse in-office and came to the determination to cancel the appt until having found her hx info of previous shingles immunizations. Pt is reporting that she now has the info and can reschedule the appt. Connected to [facility] to ensure proper scheduling per the immunization schedule, pt is speaking with them now.  Protocols used: Information Only Call - No Triage-A-AH

## 2022-12-28 NOTE — Telephone Encounter (Signed)
Contacted pt. She states that she has been waiting for a while.  Complains that a PA is needed for Ozempic.

## 2022-12-29 ENCOUNTER — Ambulatory Visit: Payer: Medicaid Other

## 2022-12-29 ENCOUNTER — Other Ambulatory Visit (HOSPITAL_COMMUNITY): Payer: Self-pay

## 2022-12-31 ENCOUNTER — Telehealth: Payer: Self-pay

## 2022-12-31 ENCOUNTER — Other Ambulatory Visit (HOSPITAL_COMMUNITY): Payer: Self-pay

## 2022-12-31 NOTE — Telephone Encounter (Signed)
Pharmacy Patient Advocate Encounter  Received notification from [facility] Medicaid that Prior Authorization for Ozempic has been APPROVED from 12/17/2022 to 12/31/2023. Ran test claim, Copay is $4.00. This test claim was processed through [facility]- copay amounts may vary at other pharmacies due to pharmacy/plan contracts, or as the patient moves through the different stages of their insurance plan.   PA #/Case ID/Reference #: NA

## 2022-12-31 NOTE — Telephone Encounter (Signed)
Pharmacy Patient Advocate Encounter   Received notification from Pt Calls Messages that prior authorization for Ozempic (2 MG/DOSE) 8MG /3ML pen-injectors is required/requested.   Insurance verification completed.   The patient is insured through [facility] [place] .   Per test claim: PA required; PA submitted to above mentioned insurance via CoverMyMeds Key/confirmation #/EOC [id number] Status is pending

## 2022-12-31 NOTE — Telephone Encounter (Signed)
PA request has been Submitted. New Encounter created for follow up. For additional info see Pharmacy Prior Auth telephone encounter from 12/31/2022.

## 2023-01-06 DIAGNOSIS — Z419 Encounter for procedure for purposes other than remedying health state, unspecified: Secondary | ICD-10-CM | POA: Diagnosis not present

## 2023-01-22 ENCOUNTER — Telehealth: Payer: Self-pay | Admitting: Nurse Practitioner

## 2023-01-22 NOTE — Telephone Encounter (Signed)
Spoke with pt. She is requesting to have her handicap placard renewed.

## 2023-01-22 NOTE — Telephone Encounter (Signed)
Please place form in my box to be filled out

## 2023-01-22 NOTE — Telephone Encounter (Signed)
Copied from CRM [id number]. Topic: General - Call Back - No Documentation >> Jan 22, 2023  9:29 AM Samuel Jester B wrote: Reason for CRM: Pt stated that she is needing a handicap sticker due to hers is about to expired, pt would like a callback.

## 2023-01-25 NOTE — Telephone Encounter (Signed)
Contacted pt pharmacy to confirm refills. Spoke with pharmacy staff.  Contacted pt to let her know that there are several refills on file for Ozempic. Pt verbalized understanding.

## 2023-01-25 NOTE — Telephone Encounter (Signed)
Sent ozempic in 12/08/2022 with a refill (6 months worth).  I will review the handicap placard

## 2023-01-25 NOTE — Telephone Encounter (Signed)
Contacted to see when her handicap sticker expires. Pt states the expiration date is the first week of February. Placed handicap form in pcp folder.   Pt states the need for refill for Ozempic. States that she had last dose yesterday.

## 2023-02-06 DIAGNOSIS — Z419 Encounter for procedure for purposes other than remedying health state, unspecified: Secondary | ICD-10-CM | POA: Diagnosis not present

## 2023-02-10 NOTE — Telephone Encounter (Signed)
 Placed on your desk.

## 2023-02-10 NOTE — Telephone Encounter (Signed)
Pt came in office inquiring about handicapped placecard being completed.  She completed charge slip it was placed in providers box for completion.

## 2023-02-19 NOTE — Telephone Encounter (Signed)
Contacted pt to inform her of handicap placard form has been completed and is ready for pick up. Pt states they are on the way. No other questions,

## 2023-02-19 NOTE — Telephone Encounter (Signed)
Patient picked up from at front desk.

## 2023-02-23 ENCOUNTER — Other Ambulatory Visit: Payer: Self-pay | Admitting: Nurse Practitioner

## 2023-02-23 DIAGNOSIS — E1165 Type 2 diabetes mellitus with hyperglycemia: Secondary | ICD-10-CM

## 2023-02-23 NOTE — Telephone Encounter (Signed)
 Copied from CRM [id number]. Topic: Clinical - Medication Refill >> Feb 23, 2023 10:38 AM Marica Otter wrote: Most Recent Primary Care Visit:  Provider: Eden Emms  Department: [facility]  Visit Type: OFFICE VISIT  Date: 12/08/2022  Medication: Semaglutide, 2 MG/DOSE, 8 MG/3ML SOPN  Has the patient contacted their pharmacy? Yes (Agent: If no, request that the patient contact the pharmacy for the refill. If patient does not wish to contact the pharmacy document the reason why and proceed with request.) (Agent: If yes, when and what did the pharmacy advise?)  Is this the correct pharmacy for this prescription? Yes If no, delete pharmacy and type the correct one.  This is the patient's preferred pharmacy:  [phone] [place] - [name] [place] [address] Phone: [phone] Fax: [phone]    Has the prescription been filled recently? No  Is the patient out of the medication? Yes  Has the patient been seen for an appointment in the last year OR does the patient have an upcoming appointment? Yes  Can we respond through MyChart? Yes  Agent: Please be advised that Rx refills may take up to 3 business days. We ask that you follow-up with your pharmacy.

## 2023-03-06 DIAGNOSIS — Z419 Encounter for procedure for purposes other than remedying health state, unspecified: Secondary | ICD-10-CM | POA: Diagnosis not present

## 2023-03-08 ENCOUNTER — Ambulatory Visit (INDEPENDENT_AMBULATORY_CARE_PROVIDER_SITE_OTHER): Payer: Medicaid Other | Admitting: Nurse Practitioner

## 2023-03-08 ENCOUNTER — Encounter: Payer: Self-pay | Admitting: Nurse Practitioner

## 2023-03-08 VITALS — BP 118/80 | HR 93 | Temp 97.5°F | Ht 59.0 in | Wt 166.8 lb

## 2023-03-08 DIAGNOSIS — R202 Paresthesia of skin: Secondary | ICD-10-CM | POA: Insufficient documentation

## 2023-03-08 DIAGNOSIS — Z7984 Long term (current) use of oral hypoglycemic drugs: Secondary | ICD-10-CM | POA: Diagnosis not present

## 2023-03-08 DIAGNOSIS — I152 Hypertension secondary to endocrine disorders: Secondary | ICD-10-CM

## 2023-03-08 DIAGNOSIS — E1165 Type 2 diabetes mellitus with hyperglycemia: Secondary | ICD-10-CM | POA: Diagnosis not present

## 2023-03-08 DIAGNOSIS — E1159 Type 2 diabetes mellitus with other circulatory complications: Secondary | ICD-10-CM

## 2023-03-08 DIAGNOSIS — R519 Headache, unspecified: Secondary | ICD-10-CM | POA: Insufficient documentation

## 2023-03-08 HISTORY — DX: Headache, unspecified: R51.9

## 2023-03-08 LAB — POCT GLYCOSYLATED HEMOGLOBIN (HGB A1C): Hemoglobin A1C: 8 % — AB (ref 4.0–5.6)

## 2023-03-08 MED ORDER — GLIPIZIDE 10 MG PO TABS: 10.0000 mg | ORAL_TABLET | Freq: Two times a day (BID) | ORAL | 1 refills | Status: DC

## 2023-03-08 MED ORDER — SEMAGLUTIDE (2 MG/DOSE) 8 MG/3ML ~~LOC~~ SOPN: 2.0000 mg | PEN_INJECTOR | SUBCUTANEOUS | 1 refills | Status: DC

## 2023-03-08 NOTE — Assessment & Plan Note (Signed)
 Ambiguous in nature is intermittent bilateral legs but 1 leg at a time.  Will check electrolytes, red and white blood cells, TSH and B12.  Patient does have a complicating factor of chronic back pain.  Pending labs

## 2023-03-08 NOTE — Patient Instructions (Signed)
 Nice to see you today Schedule a fasting lab visit within 2 weeks Follow up with me in 3 months for your physical  You can take 2 tablets of the glipizide 5mg  twice a day until you finish your supply. I have sent in the new dose of glipizide 10mg  to the pharmacy

## 2023-03-08 NOTE — Assessment & Plan Note (Signed)
 Patient currently maintained on metformin 1500 mg daily, glipizide 5 mg twice daily, semaglutide 2 mg weekly.  A1c not controlled we will increase glipizide to 10 mg twice daily.  Patient to work on lifestyle modifications inclusive of diet and exercise.

## 2023-03-08 NOTE — Assessment & Plan Note (Signed)
 Intermittent headaches that are ambiguous in nature.  Could be positional while sleeping as patient has to sleep in certain positions due to her back blood pressure well-controlled do not think that is a factor.  Query sleep apnea?  Patient states she is felt more tired as of late and does snore.  They have been lessening in frequency.  Pending labs currently

## 2023-03-08 NOTE — Progress Notes (Signed)
 Established Patient Office Visit  Subjective   Patient ID: [name], female    DOB: 1970-01-23  Age: 53 y.o. MRN: [id number]  Chief Complaint  Patient presents with   Diabetes      DM2: patient is currently on ozempic 2mg  weekly and metformin 1500mg  daily. She is tradionally checking her glucose twice a week States that she did check her sugar level this morning and it was 177. That was fasting. States that she did have a low glucose once and it was 79. She did not have any symptoms  Statse that her diet has not been doing well. States that she has been eating stuff that she has not suppose to eat. States that she normally drink black coffee. States that she will drink regular sodas instead of zero ro diet versions States that she did start her exercise and then fell off the bandwagon She will walk even though it is slow.   Headaches: this has been going on for months. States that she will get it twcie a week. First thing when she wakes up she will have them. They will last for approx 1 hour. States that she will take tylenol or alveve or goodys/BC powders. States that she does have some daytie sleepiness during the day. States that the discomfort is described as a throbbing/sharp pain. States that she is using a tempedic pillow. But she   Paresthsia: states that it has been present ofr approx 1 year. That is intermittent and will swtich legs. States that it will last for half to the whole day. Does not know if position changes it    Review of Systems  Constitutional:  Negative for chills and fever.  Respiratory:  Negative for shortness of breath.   Cardiovascular:  Negative for chest pain.  Gastrointestinal:  Positive for constipation. Negative for abdominal pain, diarrhea, nausea and vomiting.       BM daily   Neurological:  Negative for dizziness and headaches.  Psychiatric/Behavioral:  Negative for hallucinations and suicidal ideas.       Objective:     BP 118/80    Pulse 93   Temp (!) 97.5 F (36.4 C) (Oral)   Ht 4\' 11"  (1.499 m)   Wt 166 lb 12.8 oz (75.7 kg)   SpO2 95%   BMI 33.69 kg/m  BP Readings from Last 3 Encounters:  03/08/23 118/80  12/08/22 128/82  11/10/22 137/85   Wt Readings from Last 3 Encounters:  03/08/23 166 lb 12.8 oz (75.7 kg)  12/08/22 170 lb 3.2 oz (77.2 kg)  11/10/22 169 lb (76.7 kg)   SpO2 Readings from Last 3 Encounters:  03/08/23 95%  12/08/22 97%  10/29/22 95%      Physical Exam Vitals and nursing note reviewed.  Constitutional:      Appearance: Normal appearance.  Cardiovascular:     Rate and Rhythm: Normal rate and regular rhythm.     Pulses:          Dorsalis pedis pulses are 2+ on the right side and 2+ on the left side.       Posterior tibial pulses are 1+ on the right side and 1+ on the left side.     Heart sounds: Normal heart sounds.  Pulmonary:     Effort: Pulmonary effort is normal.     Breath sounds: Normal breath sounds.  Neurological:     Mental Status: She is alert.     Deep Tendon Reflexes:  Reflex Scores:      Patellar reflexes are 2+ on the right side and 2+ on the left side.    Comments: Bilateral lower extremity strength 5/5      Results for orders placed or performed in visit on 03/08/23  POCT glycosylated hemoglobin (Hb A1C)  Result Value Ref Range   Hemoglobin A1C 8.0 (A) 4.0 - 5.6 %   HbA1c POC (<> result, manual entry)     HbA1c, POC (prediabetic range)     HbA1c, POC (controlled diabetic range)        The ASCVD Risk score (Arnett DK, et al., 2019) failed to calculate for the following reasons:   Cannot find a previous HDL lab   Cannot find a previous total cholesterol lab    Assessment & Plan:   Problem List Items Addressed This Visit       Cardiovascular and Mediastinum   Hypertension associated with diabetes [facility])   Patient currently maintained on valsartan-hydrochlorothiazide.  Blood pressure well-controlled      Relevant Medications   Semaglutide,  2 MG/DOSE, 8 MG/3ML SOPN   glipiZIDE (GLUCOTROL) 10 MG tablet   Other Relevant Orders   CBC   Comprehensive metabolic panel     Endocrine   Uncontrolled type 2 diabetes mellitus with hyperglycemia (HCC) - Primary   Patient currently maintained on metformin 1500 mg daily, glipizide 5 mg twice daily, semaglutide 2 mg weekly.  A1c not controlled we will increase glipizide to 10 mg twice daily.  Patient to work on lifestyle modifications inclusive of diet and exercise.      Relevant Medications   Semaglutide, 2 MG/DOSE, 8 MG/3ML SOPN   glipiZIDE (GLUCOTROL) 10 MG tablet   Other Relevant Orders   POCT glycosylated hemoglobin (Hb A1C) (Completed)   CBC   Vitamin B12   Comprehensive metabolic panel   Lipid panel   Microalbumin / creatinine urine ratio     Other   Frequent headaches   Intermittent headaches that are ambiguous in nature.  Could be positional while sleeping as patient has to sleep in certain positions due to her back blood pressure well-controlled do not think that is a factor.  Query sleep apnea?  Patient states she is felt more tired as of late and does snore.  They have been lessening in frequency.  Pending labs currently      Paresthesias   Ambiguous in nature is intermittent bilateral legs but 1 leg at a time.  Will check electrolytes, red and white blood cells, TSH and B12.  Patient does have a complicating factor of chronic back pain.  Pending labs      Relevant Orders   CBC   Vitamin B12   Comprehensive metabolic panel   TSH    Return in about 3 months (around 06/08/2023) for CPE and Labs.    Audria Nine, NP

## 2023-03-08 NOTE — Assessment & Plan Note (Signed)
 Patient currently maintained on valsartan-hydrochlorothiazide.  Blood pressure well-controlled

## 2023-03-24 ENCOUNTER — Other Ambulatory Visit (INDEPENDENT_AMBULATORY_CARE_PROVIDER_SITE_OTHER)

## 2023-03-24 DIAGNOSIS — I152 Hypertension secondary to endocrine disorders: Secondary | ICD-10-CM | POA: Diagnosis not present

## 2023-03-24 DIAGNOSIS — E1165 Type 2 diabetes mellitus with hyperglycemia: Secondary | ICD-10-CM

## 2023-03-24 DIAGNOSIS — R202 Paresthesia of skin: Secondary | ICD-10-CM

## 2023-03-24 DIAGNOSIS — E1159 Type 2 diabetes mellitus with other circulatory complications: Secondary | ICD-10-CM | POA: Diagnosis not present

## 2023-03-24 LAB — COMPREHENSIVE METABOLIC PANEL
ALT: 36 U/L — ABNORMAL HIGH (ref 0–35)
AST: 25 U/L (ref 0–37)
Albumin: 4.4 g/dL (ref 3.5–5.2)
Alkaline Phosphatase: 84 U/L (ref 39–117)
BUN: 15 mg/dL (ref 6–23)
CO2: 29 meq/L (ref 19–32)
Calcium: 9.5 mg/dL (ref 8.4–10.5)
Chloride: 104 meq/L (ref 96–112)
Creatinine, Ser: 0.76 mg/dL (ref 0.40–1.20)
GFR: 89.65 mL/min (ref >60.00)
Glucose, Bld: 122 mg/dL — ABNORMAL HIGH (ref 70–99)
Potassium: 4.3 meq/L (ref 3.5–5.1)
Sodium: 139 meq/L (ref 135–145)
Total Bilirubin: 0.3 mg/dL (ref 0.2–1.2)
Total Protein: 7.3 g/dL (ref 6.0–8.3)

## 2023-03-24 LAB — CBC
HCT: 37.5 % (ref 36.0–46.0)
Hemoglobin: 12.4 g/dL (ref 12.0–15.0)
MCHC: 33.1 g/dL (ref 30.0–36.0)
MCV: 83 fl (ref 78.0–100.0)
Platelets: 344 10*3/uL (ref 150.0–400.0)
RBC: 4.52 Mil/uL (ref 3.87–5.11)
RDW: 14.5 % (ref 11.5–15.5)
WBC: 9 10*3/uL (ref 4.0–10.5)

## 2023-03-24 LAB — MICROALBUMIN / CREATININE URINE RATIO
Creatinine,U: 163.9 mg/dL
Microalb Creat Ratio: 262.9 mg/g — ABNORMAL HIGH (ref 0.0–30.0)
Microalb, Ur: 43.1 mg/dL — ABNORMAL HIGH (ref 0.0–1.9)

## 2023-03-24 LAB — VITAMIN B12: Vitamin B-12: 615 pg/mL (ref 211–911)

## 2023-03-24 LAB — LIPID PANEL
Cholesterol: 220 mg/dL — ABNORMAL HIGH (ref 0–200)
HDL: 45.2 mg/dL (ref >39.00)
LDL Cholesterol: 137 mg/dL — ABNORMAL HIGH (ref 0–99)
NonHDL: 174.96
Total CHOL/HDL Ratio: 5
Triglycerides: 190 mg/dL — ABNORMAL HIGH (ref 0.0–149.0)
VLDL: 38 mg/dL (ref 0.0–40.0)

## 2023-03-24 LAB — TSH: TSH: 2.37 u[IU]/mL (ref 0.35–5.50)

## 2023-03-25 ENCOUNTER — Encounter: Payer: Self-pay | Admitting: Nurse Practitioner

## 2023-03-25 ENCOUNTER — Other Ambulatory Visit: Payer: Self-pay | Admitting: Nurse Practitioner

## 2023-03-25 DIAGNOSIS — E7800 Pure hypercholesterolemia, unspecified: Secondary | ICD-10-CM

## 2023-03-25 DIAGNOSIS — M5416 Radiculopathy, lumbar region: Secondary | ICD-10-CM | POA: Diagnosis not present

## 2023-03-25 DIAGNOSIS — M5451 Vertebrogenic low back pain: Secondary | ICD-10-CM | POA: Diagnosis not present

## 2023-03-25 DIAGNOSIS — R809 Proteinuria, unspecified: Secondary | ICD-10-CM

## 2023-03-25 DIAGNOSIS — R200 Anesthesia of skin: Secondary | ICD-10-CM | POA: Diagnosis not present

## 2023-03-25 DIAGNOSIS — G8929 Other chronic pain: Secondary | ICD-10-CM | POA: Diagnosis not present

## 2023-03-25 DIAGNOSIS — R0609 Other forms of dyspnea: Secondary | ICD-10-CM

## 2023-03-25 MED ORDER — ATORVASTATIN CALCIUM 10 MG PO TABS: 10.0000 mg | ORAL_TABLET | Freq: Every day | ORAL | 1 refills | Status: AC

## 2023-03-25 MED ORDER — VALSARTAN-HYDROCHLOROTHIAZIDE 160-12.5 MG PO TABS: 1.0000 | ORAL_TABLET | Freq: Every day | ORAL | 2 refills | Status: AC

## 2023-04-08 ENCOUNTER — Other Ambulatory Visit (HOSPITAL_COMMUNITY): Payer: Self-pay

## 2023-04-14 ENCOUNTER — Encounter: Payer: Self-pay | Admitting: Dermatology

## 2023-04-14 ENCOUNTER — Ambulatory Visit: Payer: Medicaid Other | Admitting: Dermatology

## 2023-04-14 VITALS — BP 105/71 | HR 93

## 2023-04-14 DIAGNOSIS — L99 Other disorders of skin and subcutaneous tissue in diseases classified elsewhere: Secondary | ICD-10-CM | POA: Diagnosis not present

## 2023-04-14 DIAGNOSIS — E854 Organ-limited amyloidosis: Secondary | ICD-10-CM

## 2023-04-14 MED ORDER — TACROLIMUS 0.1 % EX OINT: TOPICAL_OINTMENT | Freq: Two times a day (BID) | CUTANEOUS | 2 refills | Status: DC

## 2023-04-14 MED ORDER — CLOBETASOL PROPIONATE 0.05 % EX CREA: 1.0000 | TOPICAL_CREAM | Freq: Two times a day (BID) | CUTANEOUS | 2 refills | Status: DC

## 2023-04-14 NOTE — Patient Instructions (Addendum)
 Hello [name],  Thank you for visiting today. Here is a summary of the key instructions:  Diagnosis: Lichen Amyloidosis  - Medications:   - Apply clobetasol cream mixed with CeraVe Anti-Itch lotion twice daily for 2 weeks   - After 2 weeks, switch to tacrolimus mixed with CeraVe Anti-Itch lotion twice daily for 2 weeks   - Alternate between clobetasol and tacrolimus every 2 weeks  - Skin Care:   - Try Aveeno oatmeal baths or Epsom salt baths to help calm the skin  - Follow-up:   - Return for a follow-up appointment in 4 months  - Once the itch has improved then we can work on flattening and lightning the bumps  Please reach out if you have any questions or concerns.  Warm regards,  Dr. Langston Reusing, Dermatology    Important Information  Due to recent changes in healthcare laws, you may see results of your pathology and/or laboratory studies on MyChart before the doctors have had a chance to review them. We understand that in some cases there may be results that are confusing or concerning to you. Please understand that not all results are received at the same time and often the doctors may need to interpret multiple results in order to provide you with the best plan of care or course of treatment. Therefore, we ask that you please give [place] 2 business days to thoroughly review all your results before contacting the office for clarification. Should we see a critical lab result, you will be contacted sooner.   If You Need Anything After Your Visit  If you have any questions or concerns for your doctor, please call our main line at [phone] If no one answers, please leave a voicemail as directed and we will return your call as soon as possible. Messages left after 4 pm will be answered the following business day.   You may also send [place] a message via MyChart. We typically respond to MyChart messages within 1-2 business days.  For prescription refills, please ask your pharmacy to  contact our office. Our fax number is [phone].  If you have an urgent issue when the clinic is closed that cannot wait until the next business day, you can page your doctor at the number below.    Please note that while we do our best to be available for urgent issues outside of office hours, we are not available 24/7.   If you have an urgent issue and are unable to reach [place], you may choose to seek medical care at your doctor's office, retail clinic, urgent care center, or emergency room.  If you have a medical emergency, please immediately call 911 or go to the emergency department. In the event of inclement weather, please call our main line at [phone] for an update on the status of any delays or closures.  Dermatology Medication Tips: Please keep the boxes that topical medications come in in order to help keep track of the instructions about where and how to use these. Pharmacies typically print the medication instructions only on the boxes and not directly on the medication tubes.   If your medication is too expensive, please contact our office at [phone] or send [place] a message through MyChart.   We are unable to tell what your co-pay for medications will be in advance as this is different depending on your insurance coverage. However, we may be able to find a substitute medication at lower cost or fill out paperwork to  get insurance to cover a needed medication.   If a prior authorization is required to get your medication covered by your insurance company, please allow [place] 1-2 business days to complete this process.  Drug prices often vary depending on where the prescription is filled and some pharmacies may offer cheaper prices.  The website www.goodrx.com contains coupons for medications through different pharmacies. The prices here do not account for what the cost may be with help from insurance (it may be cheaper with your insurance), but the website can give you the price  if you did not use any insurance.  - You can print the associated coupon and take it with your prescription to the pharmacy.  - You may also stop by our office during regular business hours and pick up a GoodRx coupon card.  - If you need your prescription sent electronically to a different pharmacy, notify our office through [facility] or by phone at [phone]

## 2023-04-14 NOTE — Progress Notes (Signed)
   New Patient Visit   Subjective  [name] is a 53 y.o. female who presents for the following: rash  Patient states she has rash located at the both legs that she would like to have examined. Patient reports the areas have been there for 10 years. She reports the areas are bothersome.Patient rates irritation 9 out of 10. She states that the areas have not spread. Patient reports she has previously been treated for these areas. Patient has used a prescribed TMC 0.1% twice a day , last usage was sometime in February patient states that it does not help. Patient report that the area has been biopsy by [facility] but does not have records and patient has been seen by [facility] no reports with her.  The following portions of the chart were reviewed this encounter and updated as appropriate: medications, allergies, medical history  Review of Systems:  No other skin or systemic complaints except as noted in HPI or Assessment and Plan.  Objective  Well appearing patient in no apparent distress; mood and affect are within normal limits.  A focused examination was performed of the following areas: B/L LE  Relevant exam findings are noted in the Assessment and Plan.          FINAL DIAGNOSIS and MICROSCOPIC DESCRIPTION Diagnosis Skin , right shin, punch LICHEN AMYLOIDOSIS Microscopic Description There are deposits of amyloid in the papillary dermis as confirmed with crystal violet and congo red stains. There is epidermal hyperplasia with hyperkeratosis. There is a sparse inflammatory infiltrate of lymphocytes in the superficial dermis. Following review of the hematoxylin and eosin sections, a PAS stain was obtained to exclude a fungal infection. The PAS stain is negative for fungal organisms. Overall, the changes are consistent with lichen amyloidosis.   Assessment & Plan   1. Lichenomyeloidosis - Assessment: Patient presents with a longstanding rash on the lower  legs. Biopsy confirmed diagnosis of lichenomyeloidosis. Condition characterized by itchy skin leading to scratching, resulting in amyloid deposits and lichenification of affected areas. Current treatment with triamcinolone has not been effective in controlling symptoms.  - Plan:    Prescribe clobetasol cream, to be mixed with CeraVe Anti-Itch lotion (containing pramoxine) and applied twice daily for 2 weeks    Alternate with tacrolimus ointment, also mixed with CeraVe Anti-Itch lotion, applied twice daily for the following 2 weeks    Continue this alternating regimen to manage symptoms while avoiding prolonged use of high-potency steroids    Recommend Aveeno oatmeal baths or Epsom salt baths for additional skin calming effects    Provide patient education on the diagnosis and treatment plan    Schedule follow-up appointment in 4 months to assess improvement, with the goal of resolving itching and reducing lesion prominence    No follow-ups on file.  Samara Deist, CMA, am acting as scribe for [employer], DO.   Documentation: I have reviewed the above documentation for accuracy and completeness, and I agree with the above.  Langston Reusing, DO

## 2023-04-17 DIAGNOSIS — Z419 Encounter for procedure for purposes other than remedying health state, unspecified: Secondary | ICD-10-CM | POA: Diagnosis not present

## 2023-04-21 NOTE — Therapy (Signed)
 OUTPATIENT PHYSICAL THERAPY THORACOLUMBAR EVALUATION   Patient Name: [name] MRN: [id number] DOB:Feb 10, 1970, 53 y.o., female Today's Date: 04/26/2023  END OF SESSION:    Past Medical History:  Diagnosis Date   Back pain    Chronic kidney disease    kidney stones   Frequent headaches 03/08/2023   History of kidney stones    PONV (postoperative nausea and vomiting)    Poorly controlled type 2 diabetes mellitus (HCC)    Past Surgical History:  Procedure Laterality Date   CHOLECYSTECTOMY, LAPAROSCOPIC     CYSTOSCOPY WITH URETEROSCOPY AND STENT PLACEMENT Bilateral 07/29/2012   Procedure: CYSTOSCOPY WITH BILATERAL URETEROSCOPY AND BILATERAL STENT PLACEMENT, BASKET EXTRACTION OF BILATERAL URETERAL STONES;  Surgeon: Osborn Blaze, MD;  Location: [facility];  Service: Urology;  Laterality: Bilateral;   EXTRACORPOREAL SHOCK WAVE LITHOTRIPSY Right 08/12/2020   Procedure: RIGHT EXTRACORPOREAL SHOCK WAVE LITHOTRIPSY (ESWL);  Surgeon: Adelbert Homans, MD;  Location: [facility];  Service: Urology;  Laterality: Right;   HAND SURGERY  10/29/2020   Patient Active Problem List   Diagnosis Date Noted   Frequent headaches 03/08/2023   Paresthesias 03/08/2023   Acute cough 12/08/2022   Body aches 12/08/2022   COVID-19 12/08/2022   DOE (dyspnea on exertion) 12/08/2022   Obesity (BMI 30-39.9) 06/02/2022   Tension headache 03/24/2022   Uncontrolled type 2 diabetes mellitus with hyperglycemia (HCC) 03/04/2022   Hypertension associated with diabetes (HCC) 03/04/2022   Skin lesion 03/04/2022   IUD (intrauterine device) in place 11/14/2020   Encounter for orthopedic follow-up care 11/08/2020   Carpal tunnel syndrome of right wrist 08/28/2020   Pain in right hand 08/28/2020   Lumbar pain 12/26/2019   Elevated blood pressure reading without diagnosis of hypertension 07/26/2019   Prediabetes 07/21/2018   Serum cholesterol elevated 07/21/2018   Pain in thoracic spine  06/22/2012   Lumbago 06/22/2012    PCP: Dorothe Gaster, NP   REFERRING PROVIDER: Glorious Larry, FNP  REFERRING DIAG: [phone] (ICD-10-CM) - Spondylosis without myelopathy or radiculopathy, lumbosacral region M54.16 (ICD-10-CM) - Radiculopathy, lumbar region M47.816 (ICD-10-CM) - Spondylosis without myelopathy or radiculopathy, lumbar region M51.369 (ICD-10-CM) - Degenerative disc disease, lumbar M54.51 (ICD-10-CM) - Vertebrogenic low back pain G89.29 (ICD-10-CM) - Other chronic pain  Rationale for Evaluation and Treatment: Rehabilitation  THERAPY DIAG:  Other low back pain  Muscle weakness (generalized)  ONSET DATE: 2+ years  SUBJECTIVE:  SUBJECTIVE STATEMENT: Describes a chronic history of low back w/o radicular symptoms.  Has had previous PT and chiropractic with no benefit.  DN worsens symptoms.  PERTINENT HISTORY:  03/25/2023: Patient presents with lumbar radiculopathy that may be caused by lumbar spondylosis, or possible protrusion of the lumbar intervertebral disc.   -Conservative management including NSAIDs and stretching exercises have not helped.   -I recommend a MRI-LS to properly evaluate the cause of back and leg pain symptoms for possible interventions or a surgical consideration   -Will reassess in 4 weeks after the MRI to discuss the future plan of treatment.   -Pain has failed to improve with conservative care including rest, ice, heat, NSAIDs, topical pain relievers, physical therapy and home exercises.   PT 2023-2024 at [facility]   Previous Lspine Xray 2024 noted DDD at L3-5   Paitent to continue use of LSO as recommended and continue HEP as directed.    For the patient's neuropathic pain I will prescribe gabapentin 300 mg to be titrated upwards in a stepwise fashion every 7 days  days until TID frequency is achieved.    Continue diclofenac  as needed, patient has script    PAIN:  Are you having pain? Yes: NPRS scale: 9.5/10 Pain location: low back Pain description: sharp Aggravating factors: prolonged positioning and activity Relieving factors: nothing  PRECAUTIONS: Back  RED FLAGS: None   WEIGHT BEARING RESTRICTIONS: No  FALLS:  Has patient fallen in last 6 months? No  OCCUPATION: not working  PLOF: Independent  PATIENT GOALS: To manage my back pain  NEXT MD VISIT: TBD  OBJECTIVE:  Note: Objective measures were completed at Evaluation unless otherwise noted.  DIAGNOSTIC FINDINGS:  CLINICAL DATA:  Chronic lower back pain after fall several years ago.   EXAM: LUMBAR SPINE - COMPLETE 4+ VIEW   COMPARISON:  None Available.   FINDINGS: There is no evidence of lumbar spine fracture. Alignment is normal. Mild degenerative disc disease is noted at L2-3 and L3-4 with anterior osteophyte formation.   IMPRESSION: Mild multilevel degenerative disc disease. No acute abnormality seen.     Electronically Signed   By: Rosalene Colon M.D.   On: 03/06/2022 12:26  PATIENT SURVEYS:  Modified Oswestry 38/50 (74% perceived disability)   MUSCLE LENGTH: Hamstrings: Right 90 deg; Left 90 deg   POSTURE: increased lumbar lordosis  PALPATION: deferred  LUMBAR ROM:   AROM eval  Flexion 50%P!  Extension 50%P!  Right lateral flexion 50%P!  Left lateral flexion 50%P!  Right rotation   Left rotation    (Blank rows = not tested)  LOWER EXTREMITY ROM:   WFL for gait and transfers  Active  Right eval Left eval  Hip flexion    Hip extension    Hip abduction    Hip adduction    Hip internal rotation    Hip external rotation    Knee flexion    Knee extension    Ankle dorsiflexion    Ankle plantarflexion    Ankle inversion    Ankle eversion     (Blank rows = not tested)  LOWER EXTREMITY MMT:    MMT Right eval Left eval  Hip  flexion    Hip extension    Hip abduction    Hip adduction    Hip internal rotation    Hip external rotation    Knee flexion    Knee extension    Ankle dorsiflexion    Ankle plantarflexion    Ankle inversion  Ankle eversion     (Blank rows = not tested)  LUMBAR SPECIAL TESTS:  Straight leg raise test: Negative, Slump test: Negative, and FABER test: Negative  FUNCTIONAL TESTS:  30 seconds chair stand test 3  GAIT: Distance walked: 32ftx2 Assistive device utilized: None Level of assistance: Complete Independence Comments: slow cadence, antalgic gait  TREATMENT DATE:  [facility] Adult PT Treatment:                                                DATE: 04/22/23 Eval and HEP Self Care: Additional minutes spent for educating on updated Therapeutic Home Exercise Program as well as comparing current status to condition at start of symptoms. This included exercises focusing on stretching, strengthening, with focus on eccentric aspects. Long term goals include an improvement in range of motion, strength, endurance as well as avoiding reinjury. Patient's frequency would include in 1-2 times a day, 3-5 times a week for a duration of 6-12 weeks. Proper technique shown and discussed handout in great detail. All questions were discussed and addressed.                                                                                                                                   PATIENT EDUCATION:  Education details: Discussed eval findings, rehab rationale and POC and patient is in agreement  Person educated: Patient Education method: Explanation Education comprehension: verbalized understanding and needs further education  HOME EXERCISE PROGRAM: Access Code: [email or web address] URL: [email or web address] Date: 04/22/2023 Prepared by: Gretta Leavens  Exercises - Supine 90/90 Abdominal Bracing  - 2-3 x daily - 5 x weekly - 1 sets - 2 reps - 30s hold - Supine Quadratus Lumborum Stretch   - 2-3 x daily - 5 x weekly - 1 sets - 2 reps - 30s hold - Sit to Stand with Arms Crossed  - 1-2 x daily - 5 x weekly - 1 sets - 5 reps  ASSESSMENT:  CLINICAL IMPRESSION: Patient is a 53 y.o. female who was seen today for physical therapy evaluation and treatment for chronic low back pain of vertebrogenic origin w/o radicular component.  Patient presents with marked restrictions in trunk ROM but LE ROM is WNL.  Palpation finds marked discomfort and tightness across lumbar paraspinals.  Neural tension tests did not elicit symptoms.  30s chair stand test limited to 3 reps due to pain.  Patient presents with high pain levels and S&S of mechanical low back pain.  OBJECTIVE IMPAIRMENTS: Abnormal gait, decreased activity tolerance, decreased endurance, decreased knowledge of condition, decreased mobility, difficulty walking, decreased ROM, decreased strength, increased edema, increased fascial restrictions, increased muscle spasms, improper body mechanics, and pain.   ACTIVITY LIMITATIONS: carrying, lifting, bending, sitting, standing, squatting, sleeping, transfers, and bed mobility  PERSONAL FACTORS: Age, Behavior pattern, Fitness,  Past/current experiences, and Time since onset of injury/illness/exacerbation are also affecting patient's functional outcome.   REHAB POTENTIAL: Fair base on chronicity and failed conservative   CLINICAL DECISION MAKING: Evolving/moderate complexity  EVALUATION COMPLEXITY: Moderate   GOALS: Goals reviewed with patient? No  SHORT TERM GOALS: Target date: 05/13/2023  Patient to demonstrate independence in HEP  Baseline: [id number] Goal status: INITIAL  2.  Assess 2 MWT Baseline: TBD Goal status: INITIAL    LONG TERM GOALS: Target date: 06/03/2023    Patient will acknowledge 8/10 pain at least once during episode of care   Baseline: 9.5/10 Goal status: INITIAL  2.  Patient will increase 30s chair stand reps from 3 to 6 with/without arms to demonstrate and  improved functional ability with less pain/difficulty as well as reduce fall risk.  Baseline: 3 Goal status: INITIAL  3.  Patient will score at least 30/50 on FOTO to signify clinically meaningful improvement in functional abilities.   Baseline: 38/50 Goal status: INITIAL  4.  Assess 2 MWT to note progress in strength and endurance Baseline:  Goal status: INITIAL  5.  Increase trunk ROM to 75% Baseline:  AROM eval  Flexion 50%P!  Extension 50%P!  Right lateral flexion 50%P!  Left lateral flexion 50%P!   Goal status: INITIAL    PLAN:  PT FREQUENCY: 2x/week  PT DURATION: 6 weeks  PLANNED INTERVENTIONS: [id number]- PT Re-evaluation, 97110-Therapeutic exercises, 97530- Therapeutic activity, [id number]- Neuromuscular re-education, 97535- Self Care, [id number]- Manual therapy, [id number]- Gait training, [phone]- Aquatic Therapy, Stair training, Dry Needling, Spinal mobilization, and DME instructions.  PLAN FOR NEXT SESSION: HEP review and update, manual techniques as appropriate, aerobic tasks, ROM and flexibility activities, strengthening and PREs, TPDN, gait and balance training as needed    [employer] Authorization   Choose one: Rehabilitative  Standardized Assessment or Functional Outcome Tool: See Pain Assessment and Oswestry  Score or Percent Disability: 38/50  Body Parts Treated (Select each separately):  Lumbopelvic. Overall deficits/functional limitations for body part selected: moderate  If treatment provided at initial evaluation, no treatment charged due to lack of authorization.     Nilda Keathley M Desa Rech, PT 04/26/2023, 11:20 AM

## 2023-04-22 ENCOUNTER — Ambulatory Visit: Attending: Nurse Practitioner

## 2023-04-22 ENCOUNTER — Other Ambulatory Visit: Payer: Self-pay

## 2023-04-22 DIAGNOSIS — M6281 Muscle weakness (generalized): Secondary | ICD-10-CM | POA: Diagnosis not present

## 2023-04-22 DIAGNOSIS — M5459 Other low back pain: Secondary | ICD-10-CM | POA: Insufficient documentation

## 2023-04-22 NOTE — Patient Instructions (Signed)

## 2023-04-27 ENCOUNTER — Telehealth: Payer: Self-pay

## 2023-04-27 ENCOUNTER — Other Ambulatory Visit (HOSPITAL_COMMUNITY): Payer: Self-pay

## 2023-04-27 NOTE — Telephone Encounter (Signed)
 Pharmacy Patient Advocate Encounter   Received notification from CoverMyMeds that prior authorization for Ozempic  (1 MG/DOSE) 4MG /3ML pen-injectors is required/requested.   Insurance verification completed.   The patient is insured through [facility] [place] .   Per test claim: Refill too soon. PA is not needed at this time. Medication was filled 04/26/2023. Next eligible fill date is 05/16/2023.

## 2023-05-17 DIAGNOSIS — Z419 Encounter for procedure for purposes other than remedying health state, unspecified: Secondary | ICD-10-CM | POA: Diagnosis not present

## 2023-05-27 ENCOUNTER — Ambulatory Visit: Attending: Nurse Practitioner | Admitting: Physical Therapy

## 2023-05-27 ENCOUNTER — Encounter: Payer: Self-pay | Admitting: Physical Therapy

## 2023-05-27 DIAGNOSIS — M5459 Other low back pain: Secondary | ICD-10-CM | POA: Insufficient documentation

## 2023-05-27 DIAGNOSIS — M6281 Muscle weakness (generalized): Secondary | ICD-10-CM | POA: Insufficient documentation

## 2023-05-27 NOTE — Therapy (Signed)
 OUTPATIENT PHYSICAL THERAPY DAILY NOTE   Patient Name: [name] MRN: [id number] DOB:12/26/1970, 53 y.o., female Today's Date: 05/28/2023  END OF SESSION:  PT End of Session - 05/27/23 1546     Visit Number 2    Number of Visits 12    Date for PT Re-Evaluation 06/22/23    Authorization Type [employer] - Approved 10 visits 04/22/23-06/21/23    PT Start Time 0415    PT Stop Time 0457    PT Time Calculation (min) 42 min    Activity Tolerance Patient tolerated treatment well    Behavior During Therapy [facility] for tasks assessed/performed              Past Medical History:  Diagnosis Date   Back pain    Chronic kidney disease    kidney stones   Frequent headaches 03/08/2023   History of kidney stones    PONV (postoperative nausea and vomiting)    Poorly controlled type 2 diabetes mellitus (HCC)    Past Surgical History:  Procedure Laterality Date   CHOLECYSTECTOMY, LAPAROSCOPIC     CYSTOSCOPY WITH URETEROSCOPY AND STENT PLACEMENT Bilateral 07/29/2012   Procedure: CYSTOSCOPY WITH BILATERAL URETEROSCOPY AND BILATERAL STENT PLACEMENT, BASKET EXTRACTION OF BILATERAL URETERAL STONES;  Surgeon: Osborn Blaze, MD;  Location: [facility];  Service: Urology;  Laterality: Bilateral;   EXTRACORPOREAL SHOCK WAVE LITHOTRIPSY Right 08/12/2020   Procedure: RIGHT EXTRACORPOREAL SHOCK WAVE LITHOTRIPSY (ESWL);  Surgeon: Adelbert Homans, MD;  Location: [facility];  Service: Urology;  Laterality: Right;   HAND SURGERY  10/29/2020   Patient Active Problem List   Diagnosis Date Noted   Frequent headaches 03/08/2023   Paresthesias 03/08/2023   Acute cough 12/08/2022   Body aches 12/08/2022   COVID-19 12/08/2022   DOE (dyspnea on exertion) 12/08/2022   Obesity (BMI 30-39.9) 06/02/2022   Tension headache 03/24/2022   Uncontrolled type 2 diabetes mellitus with hyperglycemia (HCC) 03/04/2022   Hypertension associated with diabetes (HCC) 03/04/2022   Skin lesion 03/04/2022    IUD (intrauterine device) in place 11/14/2020   Encounter for orthopedic follow-up care 11/08/2020   Carpal tunnel syndrome of right wrist 08/28/2020   Pain in right hand 08/28/2020   Lumbar pain 12/26/2019   Elevated blood pressure reading without diagnosis of hypertension 07/26/2019   Prediabetes 07/21/2018   Serum cholesterol elevated 07/21/2018   Pain in thoracic spine 06/22/2012   Lumbago 06/22/2012    PCP: Dorothe Gaster, NP   REFERRING PROVIDER: Glorious Larry, FNP  REFERRING DIAG: [phone] (ICD-10-CM) - Spondylosis without myelopathy or radiculopathy, lumbosacral region M54.16 (ICD-10-CM) - Radiculopathy, lumbar region M47.816 (ICD-10-CM) - Spondylosis without myelopathy or radiculopathy, lumbar region M51.369 (ICD-10-CM) - Degenerative disc disease, lumbar M54.51 (ICD-10-CM) - Vertebrogenic low back pain G89.29 (ICD-10-CM) - Other chronic pain  Rationale for Evaluation and Treatment: Rehabilitation  THERAPY DIAG:  Other low back pain  Muscle weakness (generalized)  ONSET DATE: 2+ years  SUBJECTIVE:  SUBJECTIVE STATEMENT:  05/28/2023: Pt reports her pain is 8/10 which is typical.  EVAL:  Describes a chronic history of low back w/o radicular symptoms.  Has had previous PT and chiropractic with no benefit.  DN worsens symptoms.  PERTINENT HISTORY:  03/25/2023: Patient presents with lumbar radiculopathy that may be caused by lumbar spondylosis, or possible protrusion of the lumbar intervertebral disc.   -Conservative management including NSAIDs and stretching exercises have not helped.   -I recommend a MRI-LS to properly evaluate the cause of back and leg pain symptoms for possible interventions or a surgical consideration   -Will reassess in 4 weeks after the MRI to discuss the future  plan of treatment.   -Pain has failed to improve with conservative care including rest, ice, heat, NSAIDs, topical pain relievers, physical therapy and home exercises.   PT 2023-2024 at [facility]   Previous Lspine Xray 2024 noted DDD at L3-5   Paitent to continue use of LSO as recommended and continue HEP as directed.    For the patient's neuropathic pain I will prescribe gabapentin 300 mg to be titrated upwards in a stepwise fashion every 7 days days until TID frequency is achieved.    Continue diclofenac  as needed, patient has script    PAIN:  Are you having pain? Yes: NPRS scale: 9.5/10 Pain location: low back Pain description: sharp Aggravating factors: prolonged positioning and activity Relieving factors: nothing  PRECAUTIONS: Back  RED FLAGS: None   WEIGHT BEARING RESTRICTIONS: No  FALLS:  Has patient fallen in last 6 months? No  OCCUPATION: not working  PLOF: Independent  PATIENT GOALS: To manage my back pain  NEXT MD VISIT: TBD  OBJECTIVE:  Note: Objective measures were completed at Evaluation unless otherwise noted.  DIAGNOSTIC FINDINGS:  CLINICAL DATA:  Chronic lower back pain after fall several years ago.   EXAM: LUMBAR SPINE - COMPLETE 4+ VIEW   COMPARISON:  None Available.   FINDINGS: There is no evidence of lumbar spine fracture. Alignment is normal. Mild degenerative disc disease is noted at L2-3 and L3-4 with anterior osteophyte formation.   IMPRESSION: Mild multilevel degenerative disc disease. No acute abnormality seen.     Electronically Signed   By: Rosalene Colon M.D.   On: 03/06/2022 12:26  PATIENT SURVEYS:  Modified Oswestry 38/50 (74% perceived disability)   MUSCLE LENGTH: Hamstrings: Right 90 deg; Left 90 deg   POSTURE: increased lumbar lordosis  PALPATION: deferred  LUMBAR ROM:   AROM eval  Flexion 50%P!  Extension 50%P!  Right lateral flexion 50%P!  Left lateral flexion 50%P!  Right rotation   Left rotation     (Blank rows = not tested)  LOWER EXTREMITY ROM:   WFL for gait and transfers  Active  Right eval Left eval  Hip flexion    Hip extension    Hip abduction    Hip adduction    Hip internal rotation    Hip external rotation    Knee flexion    Knee extension    Ankle dorsiflexion    Ankle plantarflexion    Ankle inversion    Ankle eversion     (Blank rows = not tested)  LOWER EXTREMITY MMT:    MMT Right eval Left eval  Hip flexion    Hip extension    Hip abduction    Hip adduction    Hip internal rotation    Hip external rotation    Knee flexion    Knee extension  Ankle dorsiflexion    Ankle plantarflexion    Ankle inversion    Ankle eversion     (Blank rows = not tested)  LUMBAR SPECIAL TESTS:  Straight leg raise test: Negative, Slump test: Negative, and FABER test: Negative  FUNCTIONAL TESTS:  30 seconds chair stand test 3  GAIT: Distance walked: 77ftx2 Assistive device utilized: None Level of assistance: Complete Independence Comments: slow cadence, antalgic gait  TREATMENT DATE:   TREATMENT 05/27/23:  Aquatic therapy at [facility]- [address] - therapeutic pool temp 92 degrees Pt enters building independently.  Treatment took place in water 3.8 to  4 ft 8 in.feet deep depending upon activity.  Pt entered and exited the pool via stair and handrails    Aquatic Therapy:  Water walking for warm up fwd/lat/bkwds  Squats  Heel raises HS curls Hip abd Straight leg hip flexion/ext HS stretch on step Hip flexor stretch on step - stopped d/t low back pain L stretch EOP Lateral walking with water bell shoulder add/abd Kick board push down [employer] push/pull  Pt requires the buoyancy of water for active assisted exercises with buoyancy supported for strengthening and AROM exercises. Hydrostatic pressure also supports joints by unweighting joint load by at least 50 % in 3-4 feet depth water. 80% in chest to neck deep water. Water will  provide assistance with movement using the current and laminar flow while the buoyancy reduces weight bearing. Pt requires the viscosity of the water for resistance with strengthening exercises.  HOME EXERCISE PROGRAM: Access Code: [email or web address] URL: [email or web address] Date: 04/22/2023 Prepared by: Gretta Leavens  Exercises - Supine 90/90 Abdominal Bracing  - 2-3 x daily - 5 x weekly - 1 sets - 2 reps - 30s hold - Supine Quadratus Lumborum Stretch  - 2-3 x daily - 5 x weekly - 1 sets - 2 reps - 30s hold - Sit to Stand with Arms Crossed  - 1-2 x daily - 5 x weekly - 1 sets - 5 reps  ASSESSMENT:  CLINICAL IMPRESSION:  05/28/2023: Session today focused on hip and core strengthening in the aquatic environment for use of buoyancy to offload joints and the viscosity of water as resistance during therapeutic exercise.  Abraham Hoffmann was able to complete listed exercises with fatigue but no increase in pain.  She was cued for form and pacing, specifically with hip abd and step up to maximize therapeutic benefit.  Patient was able to tolerate all prescribed exercises in the aquatic environment with no adverse effects and reports 8/10 pain at the end of the session. Patient continues to benefit from skilled PT services on land and aquatic based and should be progressed as able to improve functional independence.   EVAL:   Patient is a 53 y.o. female who was seen today for physical therapy evaluation and treatment for chronic low back pain of vertebrogenic origin w/o radicular component.  Patient presents with marked restrictions in trunk ROM but LE ROM is WNL.  Palpation finds marked discomfort and tightness across lumbar paraspinals.  Neural tension tests did not elicit symptoms.  30s chair stand test limited to 3 reps due to pain.  Patient presents with high pain levels and S&S of mechanical low back pain.  OBJECTIVE IMPAIRMENTS: Abnormal gait, decreased activity tolerance, decreased endurance,  decreased knowledge of condition, decreased mobility, difficulty walking, decreased ROM, decreased strength, increased edema, increased fascial restrictions, increased muscle spasms, improper body mechanics, and pain.   ACTIVITY LIMITATIONS: carrying, lifting, bending, sitting, standing, squatting, sleeping,  transfers, and bed mobility  PERSONAL FACTORS: Age, Behavior pattern, Fitness, Past/current experiences, and Time since onset of injury/illness/exacerbation are also affecting patient's functional outcome.   REHAB POTENTIAL: Fair base on chronicity and failed conservative   CLINICAL DECISION MAKING: Evolving/moderate complexity  EVALUATION COMPLEXITY: Moderate   GOALS: Goals reviewed with patient? No  SHORT TERM GOALS: Target date: 05/13/2023  Patient to demonstrate independence in HEP  Baseline: [id number] Goal status: INITIAL  2.  Assess 2 MWT Baseline: TBD Goal status: INITIAL    LONG TERM GOALS: Target date: 06/03/2023    Patient will acknowledge 8/10 pain at least once during episode of care   Baseline: 9.5/10 Goal status: INITIAL  2.  Patient will increase 30s chair stand reps from 3 to 6 with/without arms to demonstrate and improved functional ability with less pain/difficulty as well as reduce fall risk.  Baseline: 3 Goal status: INITIAL  3.  Patient will score at least 30/50 on FOTO to signify clinically meaningful improvement in functional abilities.   Baseline: 38/50 Goal status: INITIAL  4.  Assess 2 MWT to note progress in strength and endurance Baseline:  Goal status: INITIAL  5.  Increase trunk ROM to 75% Baseline:  AROM eval  Flexion 50%P!  Extension 50%P!  Right lateral flexion 50%P!  Left lateral flexion 50%P!   Goal status: INITIAL    PLAN:  PT FREQUENCY: 2x/week  PT DURATION: 6 weeks  PLANNED INTERVENTIONS: 97164- PT Re-evaluation, 97110-Therapeutic exercises, 97530- Therapeutic activity, [id number]- Neuromuscular re-education, 97535-  Self Care, 04540- Manual therapy, [id number]- Gait training, [phone]- Aquatic Therapy, Stair training, Dry Needling, Spinal mobilization, and DME instructions.  PLAN FOR NEXT SESSION: HEP review and update, manual techniques as appropriate, aerobic tasks, ROM and flexibility activities, strengthening and PREs, TPDN, gait and balance training as needed    [employer] Authorization   Choose one: Rehabilitative  Standardized Assessment or Functional Outcome Tool: See Pain Assessment and Oswestry  Score or Percent Disability: 38/50  Body Parts Treated (Select each separately):  Lumbopelvic. Overall deficits/functional limitations for body part selected: moderate  If treatment provided at initial evaluation, no treatment charged due to lack of authorization.     Marquis Sitter, PT 05/28/2023, 8:01 AM

## 2023-06-01 ENCOUNTER — Telehealth: Payer: Self-pay

## 2023-06-01 ENCOUNTER — Other Ambulatory Visit (HOSPITAL_COMMUNITY): Payer: Self-pay

## 2023-06-01 NOTE — Telephone Encounter (Signed)
 Pharmacy Patient Advocate Encounter   Received notification from CoverMyMeds that prior authorization for Ozempic  2 is required/requested.   Insurance verification completed.   The patient is insured through [facility] .   Per test claim: Refill too soon. PA is not needed at this time. Medication was filled 05/19/23. Next eligible fill date is 06/29/23.

## 2023-06-03 ENCOUNTER — Ambulatory Visit: Admitting: Physical Therapy

## 2023-06-03 DIAGNOSIS — M6281 Muscle weakness (generalized): Secondary | ICD-10-CM

## 2023-06-03 DIAGNOSIS — M5459 Other low back pain: Secondary | ICD-10-CM | POA: Diagnosis not present

## 2023-06-03 NOTE — Therapy (Signed)
 OUTPATIENT PHYSICAL THERAPY DAILY NOTE   Patient Name: [name] MRN: [id number] DOB:1970-07-23, 53 y.o., female Today's Date: 06/04/2023  END OF SESSION:  PT End of Session - 06/03/23 1612     Visit Number 3    Number of Visits 12    Date for PT Re-Evaluation 06/22/23    Authorization Type [employer] - Approved 10 visits 04/22/23-06/21/23    PT Start Time 0410    PT Stop Time 0451    PT Time Calculation (min) 41 min    Activity Tolerance Patient tolerated treatment well    Behavior During Therapy [facility] for tasks assessed/performed               Past Medical History:  Diagnosis Date   Back pain    Chronic kidney disease    kidney stones   Frequent headaches 03/08/2023   History of kidney stones    PONV (postoperative nausea and vomiting)    Poorly controlled type 2 diabetes mellitus (HCC)    Past Surgical History:  Procedure Laterality Date   CHOLECYSTECTOMY, LAPAROSCOPIC     CYSTOSCOPY WITH URETEROSCOPY AND STENT PLACEMENT Bilateral 07/29/2012   Procedure: CYSTOSCOPY WITH BILATERAL URETEROSCOPY AND BILATERAL STENT PLACEMENT, BASKET EXTRACTION OF BILATERAL URETERAL STONES;  Surgeon: Osborn Blaze, MD;  Location: [facility];  Service: Urology;  Laterality: Bilateral;   EXTRACORPOREAL SHOCK WAVE LITHOTRIPSY Right 08/12/2020   Procedure: RIGHT EXTRACORPOREAL SHOCK WAVE LITHOTRIPSY (ESWL);  Surgeon: Adelbert Homans, MD;  Location: [facility];  Service: Urology;  Laterality: Right;   HAND SURGERY  10/29/2020   Patient Active Problem List   Diagnosis Date Noted   Frequent headaches 03/08/2023   Paresthesias 03/08/2023   Acute cough 12/08/2022   Body aches 12/08/2022   COVID-19 12/08/2022   DOE (dyspnea on exertion) 12/08/2022   Obesity (BMI 30-39.9) 06/02/2022   Tension headache 03/24/2022   Uncontrolled type 2 diabetes mellitus with hyperglycemia (HCC) 03/04/2022   Hypertension associated with diabetes (HCC) 03/04/2022   Skin lesion  03/04/2022   IUD (intrauterine device) in place 11/14/2020   Encounter for orthopedic follow-up care 11/08/2020   Carpal tunnel syndrome of right wrist 08/28/2020   Pain in right hand 08/28/2020   Lumbar pain 12/26/2019   Elevated blood pressure reading without diagnosis of hypertension 07/26/2019   Prediabetes 07/21/2018   Serum cholesterol elevated 07/21/2018   Pain in thoracic spine 06/22/2012   Lumbago 06/22/2012    PCP: Dorothe Gaster, NP   REFERRING PROVIDER: Glorious Larry, FNP  REFERRING DIAG: [phone] (ICD-10-CM) - Spondylosis without myelopathy or radiculopathy, lumbosacral region M54.16 (ICD-10-CM) - Radiculopathy, lumbar region M47.816 (ICD-10-CM) - Spondylosis without myelopathy or radiculopathy, lumbar region M51.369 (ICD-10-CM) - Degenerative disc disease, lumbar M54.51 (ICD-10-CM) - Vertebrogenic low back pain G89.29 (ICD-10-CM) - Other chronic pain  Rationale for Evaluation and Treatment: Rehabilitation  THERAPY DIAG:  Other low back pain  Muscle weakness (generalized)  ONSET DATE: 2+ years  SUBJECTIVE:  SUBJECTIVE STATEMENT:  06/04/2023: Pt reports her pain is 8/10.  No issues after last aquatics visit but was a little stiff.  EVAL:  Describes a chronic history of low back w/o radicular symptoms.  Has had previous PT and chiropractic with no benefit.  DN worsens symptoms.  PERTINENT HISTORY:  03/25/2023: Patient presents with lumbar radiculopathy that may be caused by lumbar spondylosis, or possible protrusion of the lumbar intervertebral disc.   -Conservative management including NSAIDs and stretching exercises have not helped.   -I recommend a MRI-LS to properly evaluate the cause of back and leg pain symptoms for possible interventions or a surgical consideration   -Will  reassess in 4 weeks after the MRI to discuss the future plan of treatment.   -Pain has failed to improve with conservative care including rest, ice, heat, NSAIDs, topical pain relievers, physical therapy and home exercises.   PT 2023-2024 at [facility]   Previous Lspine Xray 2024 noted DDD at L3-5   Paitent to continue use of LSO as recommended and continue HEP as directed.    For the patient's neuropathic pain I will prescribe gabapentin 300 mg to be titrated upwards in a stepwise fashion every 7 days days until TID frequency is achieved.    Continue diclofenac  as needed, patient has script    PAIN:  Are you having pain? Yes: NPRS scale: 9.5/10 Pain location: low back Pain description: sharp Aggravating factors: prolonged positioning and activity Relieving factors: nothing  PRECAUTIONS: Back  RED FLAGS: None   WEIGHT BEARING RESTRICTIONS: No  FALLS:  Has patient fallen in last 6 months? No  OCCUPATION: not working  PLOF: Independent  PATIENT GOALS: To manage my back pain  NEXT MD VISIT: TBD  OBJECTIVE:  Note: Objective measures were completed at Evaluation unless otherwise noted.  DIAGNOSTIC FINDINGS:  CLINICAL DATA:  Chronic lower back pain after fall several years ago.   EXAM: LUMBAR SPINE - COMPLETE 4+ VIEW   COMPARISON:  None Available.   FINDINGS: There is no evidence of lumbar spine fracture. Alignment is normal. Mild degenerative disc disease is noted at L2-3 and L3-4 with anterior osteophyte formation.   IMPRESSION: Mild multilevel degenerative disc disease. No acute abnormality seen.     Electronically Signed   By: Rosalene Colon M.D.   On: 03/06/2022 12:26  PATIENT SURVEYS:  Modified Oswestry 38/50 (74% perceived disability)   MUSCLE LENGTH: Hamstrings: Right 90 deg; Left 90 deg   POSTURE: increased lumbar lordosis  PALPATION: deferred  LUMBAR ROM:   AROM eval  Flexion 50%P!  Extension 50%P!  Right lateral flexion 50%P!  Left  lateral flexion 50%P!  Right rotation   Left rotation    (Blank rows = not tested)  LOWER EXTREMITY ROM:   WFL for gait and transfers  Active  Right eval Left eval  Hip flexion    Hip extension    Hip abduction    Hip adduction    Hip internal rotation    Hip external rotation    Knee flexion    Knee extension    Ankle dorsiflexion    Ankle plantarflexion    Ankle inversion    Ankle eversion     (Blank rows = not tested)  LOWER EXTREMITY MMT:    MMT Right eval Left eval  Hip flexion    Hip extension    Hip abduction    Hip adduction    Hip internal rotation    Hip external rotation  Knee flexion    Knee extension    Ankle dorsiflexion    Ankle plantarflexion    Ankle inversion    Ankle eversion     (Blank rows = not tested)  LUMBAR SPECIAL TESTS:  Straight leg raise test: Negative, Slump test: Negative, and FABER test: Negative  FUNCTIONAL TESTS:  30 seconds chair stand test 3  GAIT: Distance walked: 51ftx2 Assistive device utilized: None Level of assistance: Complete Independence Comments: slow cadence, antalgic gait  TREATMENT DATE:   TREATMENT 06/03/23:  Aquatic therapy at [facility]- [address] - therapeutic pool temp 92 degrees Pt enters building independently.  Treatment took place in water 3.8 to  4 ft 8 in.feet deep depending upon activity.  Pt entered and exited the pool via stair and handrails    Aquatic Therapy:  Water walking for warm up fwd/lat/bkwds  Squats  Heel raises Hip abd Hip ext Straight leg hip flexion/ext HS stretch on step Step up fwd L stretch EOP Blue noodle stomp - 2x10 Hip hinge with white barbell Bil shoulder ext yellow dumbells with abdominal contraction Kick board push/pull  Pt requires the buoyancy of water for active assisted exercises with buoyancy supported for strengthening and AROM exercises. Hydrostatic pressure also supports joints by unweighting joint load by at least 50 % in 3-4 feet depth  water. 80% in chest to neck deep water. Water will provide assistance with movement using the current and laminar flow while the buoyancy reduces weight bearing. Pt requires the viscosity of the water for resistance with strengthening exercises.  HOME EXERCISE PROGRAM: Access Code: [email or web address] URL: [email or web address] Date: 04/22/2023 Prepared by: Gretta Leavens  Exercises - Supine 90/90 Abdominal Bracing  - 2-3 x daily - 5 x weekly - 1 sets - 2 reps - 30s hold - Supine Quadratus Lumborum Stretch  - 2-3 x daily - 5 x weekly - 1 sets - 2 reps - 30s hold - Sit to Stand with Arms Crossed  - 1-2 x daily - 5 x weekly - 1 sets - 5 reps  ASSESSMENT:  CLINICAL IMPRESSION:  06/04/2023: Session today focused on hip and core strengthening in the aquatic environment for use of buoyancy to offload joints and the viscosity of water as resistance during therapeutic exercise.  We were able to progress exercise intensity this session with the addition of step ups and hip hinge with dumbell support.  Patient was able to tolerate all prescribed exercises in the aquatic environment with no adverse effects and reports 8/10 pain at the end of the session. Patient continues to benefit from skilled PT services on land and aquatic based and should be progressed as able to improve functional independence.    EVAL:   Patient is a 53 y.o. female who was seen today for physical therapy evaluation and treatment for chronic low back pain of vertebrogenic origin w/o radicular component.  Patient presents with marked restrictions in trunk ROM but LE ROM is WNL.  Palpation finds marked discomfort and tightness across lumbar paraspinals.  Neural tension tests did not elicit symptoms.  30s chair stand test limited to 3 reps due to pain.  Patient presents with high pain levels and S&S of mechanical low back pain.  OBJECTIVE IMPAIRMENTS: Abnormal gait, decreased activity tolerance, decreased endurance, decreased  knowledge of condition, decreased mobility, difficulty walking, decreased ROM, decreased strength, increased edema, increased fascial restrictions, increased muscle spasms, improper body mechanics, and pain.   ACTIVITY LIMITATIONS: carrying, lifting, bending, sitting, standing, squatting, sleeping, transfers, and  bed mobility  PERSONAL FACTORS: Age, Behavior pattern, Fitness, Past/current experiences, and Time since onset of injury/illness/exacerbation are also affecting patient's functional outcome.   REHAB POTENTIAL: Fair base on chronicity and failed conservative   CLINICAL DECISION MAKING: Evolving/moderate complexity  EVALUATION COMPLEXITY: Moderate   GOALS: Goals reviewed with patient? No  SHORT TERM GOALS: Target date: 05/13/2023  Patient to demonstrate independence in HEP  Baseline: [id number] Goal status: INITIAL  2.  Assess 2 MWT Baseline: TBD Goal status: INITIAL    LONG TERM GOALS: Target date: 06/03/2023    Patient will acknowledge 8/10 pain at least once during episode of care   Baseline: 9.5/10 Goal status: INITIAL  2.  Patient will increase 30s chair stand reps from 3 to 6 with/without arms to demonstrate and improved functional ability with less pain/difficulty as well as reduce fall risk.  Baseline: 3 Goal status: INITIAL  3.  Patient will score at least 30/50 on FOTO to signify clinically meaningful improvement in functional abilities.   Baseline: 38/50 Goal status: INITIAL  4.  Assess 2 MWT to note progress in strength and endurance Baseline:  Goal status: INITIAL  5.  Increase trunk ROM to 75% Baseline:  AROM eval  Flexion 50%P!  Extension 50%P!  Right lateral flexion 50%P!  Left lateral flexion 50%P!   Goal status: INITIAL    PLAN:  PT FREQUENCY: 2x/week  PT DURATION: 6 weeks  PLANNED INTERVENTIONS: 97164- PT Re-evaluation, 97110-Therapeutic exercises, 97530- Therapeutic activity, [id number]- Neuromuscular re-education, 97535- Self Care,  97140- Manual therapy, [id number]- Gait training, 91478- Aquatic Therapy, Stair training, Dry Needling, Spinal mobilization, and DME instructions.  PLAN FOR NEXT SESSION: HEP review and update, manual techniques as appropriate, aerobic tasks, ROM and flexibility activities, strengthening and PREs, TPDN, gait and balance training as needed    [employer] Authorization   Choose one: Rehabilitative  Standardized Assessment or Functional Outcome Tool: See Pain Assessment and Oswestry  Score or Percent Disability: 38/50  Body Parts Treated (Select each separately):  Lumbopelvic. Overall deficits/functional limitations for body part selected: moderate  If treatment provided at initial evaluation, no treatment charged due to lack of authorization.     Cherokee Boccio E Trellis Vanoverbeke, PT 06/04/2023, 9:13 AM

## 2023-06-04 ENCOUNTER — Telehealth: Payer: Self-pay

## 2023-06-04 ENCOUNTER — Other Ambulatory Visit (HOSPITAL_COMMUNITY): Payer: Self-pay

## 2023-06-04 NOTE — Telephone Encounter (Signed)
 Pharmacy Patient Advocate Encounter   Received notification from CoverMyMeds that prior authorization for Ozempic  8 is required/requested.   Insurance verification completed.   The patient is insured through [employer] .   Per test claim: Refill too soon. PA is not needed at this time. Medication was filled 05/19/23. Next eligible fill date is 06/29/23. Second request

## 2023-06-08 ENCOUNTER — Encounter: Payer: Self-pay | Admitting: Nurse Practitioner

## 2023-06-08 ENCOUNTER — Ambulatory Visit (INDEPENDENT_AMBULATORY_CARE_PROVIDER_SITE_OTHER)
Admission: RE | Admit: 2023-06-08 | Discharge: 2023-06-08 | Disposition: A | Discharge status: Home / Self Care | Source: Ambulatory Visit | Attending: Nurse Practitioner | Admitting: Nurse Practitioner

## 2023-06-08 ENCOUNTER — Ambulatory Visit (INDEPENDENT_AMBULATORY_CARE_PROVIDER_SITE_OTHER): Admitting: Nurse Practitioner

## 2023-06-08 VITALS — BP 132/82 | HR 85 | Temp 98.1°F | Ht 59.0 in | Wt 165.8 lb

## 2023-06-08 DIAGNOSIS — I152 Hypertension secondary to endocrine disorders: Secondary | ICD-10-CM | POA: Diagnosis not present

## 2023-06-08 DIAGNOSIS — Z7984 Long term (current) use of oral hypoglycemic drugs: Secondary | ICD-10-CM

## 2023-06-08 DIAGNOSIS — E1165 Type 2 diabetes mellitus with hyperglycemia: Secondary | ICD-10-CM | POA: Diagnosis not present

## 2023-06-08 DIAGNOSIS — M79671 Pain in right foot: Secondary | ICD-10-CM

## 2023-06-08 DIAGNOSIS — M25571 Pain in right ankle and joints of right foot: Secondary | ICD-10-CM | POA: Diagnosis not present

## 2023-06-08 DIAGNOSIS — Z0000 Encounter for general adult medical examination without abnormal findings: Secondary | ICD-10-CM | POA: Insufficient documentation

## 2023-06-08 DIAGNOSIS — M7731 Calcaneal spur, right foot: Secondary | ICD-10-CM | POA: Diagnosis not present

## 2023-06-08 DIAGNOSIS — Z7985 Long-term (current) use of injectable non-insulin antidiabetic drugs: Secondary | ICD-10-CM

## 2023-06-08 DIAGNOSIS — Z1231 Encounter for screening mammogram for malignant neoplasm of breast: Secondary | ICD-10-CM

## 2023-06-08 DIAGNOSIS — E1159 Type 2 diabetes mellitus with other circulatory complications: Secondary | ICD-10-CM | POA: Diagnosis not present

## 2023-06-08 DIAGNOSIS — M19071 Primary osteoarthritis, right ankle and foot: Secondary | ICD-10-CM | POA: Diagnosis not present

## 2023-06-08 LAB — POCT GLYCOSYLATED HEMOGLOBIN (HGB A1C): Hemoglobin A1C: 6.9 % — AB (ref 4.0–5.6)

## 2023-06-08 NOTE — Assessment & Plan Note (Signed)
 Query foreign body in foot pending x-ray if no foreign bodies in the podiatry for further evaluation

## 2023-06-08 NOTE — Assessment & Plan Note (Signed)
 Patient currently maintained on semaglutide  2 mg weekly, 1500 mg metformin  daily, glipizide  10 mg twice daily.  A1c is responded nicely currently 6.9%.  Continue checking glucose at home continue medication as prescribed

## 2023-06-08 NOTE — Progress Notes (Signed)
 Established Patient Office Visit  Subjective   Patient ID: [name], female    DOB: [birth year]  Age: 53 y.o. MRN: [id number]  Chief Complaint  Patient presents with   Annual Exam   Medication Management    Pt complains that [facility] gave patient Semaglutide  4mg  instead of 8mg . Pt states she has not been able to pick up current dose. Pharmacy states they have to send prior auth request to pcp.     HPI   HTN: Patient currently maintained on valsartan -hydrochlorothiazide  160-12.5 mg daily. She is checking it once a week at home and is good.   DM2: Patient currently maintained on glipizide  10 mg twice daily, semaglutide  2 mg weekly, metformin  1500 mg  daily.  Of note she is on a statin and ARB. She is checking it weekly and 133  for complete physical and follow up of chronic conditions.  Immunizations: -Tetanus: Completed in 2023 -Influenza: Out of season -Shingles: Completed Shingrix series -Pneumonia: Completed   Diet: Fair diet. She is eating smaller portions and less carbs. She is getting 2-3 meals a day. She is drinking balck coffee with splenda. She does have some soda (sometimes it is a diet soda) and a lot of water  Exercise: No regular exercise. [facility]. She is seeing PMR and doing water PT  Eye exam: Completes annually. Glasses  Dental exam: Completes semi-annually    Colonoscopy: Completed in 05/25/2022, recall 3 years Lung Cancer Screening: NA  Pap Smear: Patient followed by GYN Last Pap 11/10/2022 with negative HPV negative self  Mammogram: 06/05/2022 negative for malignancy due to a breast cyst repeat 1 year. GI breast centern   DEXA: too young   Sleep: going to bed around 8-10 and will get up around 730-8. Feels rested       Review of Systems  Constitutional:  Negative for chills and fever.  Respiratory:  Negative for shortness of breath.   Cardiovascular:  Negative for chest pain and leg swelling.  Gastrointestinal:  Negative for abdominal  pain, blood in stool, constipation, diarrhea, nausea and vomiting.       BM daily   Genitourinary:  Negative for dysuria and hematuria.  Neurological:  Negative for tingling and headaches.  Psychiatric/Behavioral:  Negative for hallucinations and suicidal ideas.       Objective:     BP 132/82   Pulse 85   Temp 98.1 F (36.7 C) (Oral)   Ht 4\' 11"  (1.499 m)   Wt 165 lb 12.8 oz (75.2 kg)   SpO2 96%   BMI 33.49 kg/m  BP Readings from Last 3 Encounters:  06/08/23 132/82  04/14/23 105/71  03/08/23 118/80   Wt Readings from Last 3 Encounters:  06/08/23 165 lb 12.8 oz (75.2 kg)  03/08/23 166 lb 12.8 oz (75.7 kg)  12/08/22 170 lb 3.2 oz (77.2 kg)   SpO2 Readings from Last 3 Encounters:  06/08/23 96%  03/08/23 95%  12/08/22 97%      Physical Exam Vitals and nursing note reviewed.  Constitutional:      Appearance: Normal appearance.  HENT:     Right Ear: Tympanic membrane, ear canal and external ear normal.     Left Ear: Tympanic membrane, ear canal and external ear normal.     Mouth/Throat:     Mouth: Mucous membranes are moist.     Pharynx: Oropharynx is clear.  Eyes:     Extraocular Movements: Extraocular movements intact.     Pupils:  Pupils are equal, round, and reactive to light.  Cardiovascular:     Rate and Rhythm: Normal rate and regular rhythm.     Pulses: Normal pulses.     Heart sounds: Normal heart sounds.  Pulmonary:     Effort: Pulmonary effort is normal.     Breath sounds: Normal breath sounds.  Abdominal:     General: Bowel sounds are normal. There is no distension.     Palpations: There is no mass.     Tenderness: There is no abdominal tenderness.     Hernia: No hernia is present.  Musculoskeletal:     Right lower leg: No edema.     Left lower leg: No edema.       Feet:  Lymphadenopathy:     Cervical: No cervical adenopathy.  Skin:    General: Skin is warm.  Neurological:     General: No focal deficit present.     Mental Status: She is  alert.     Deep Tendon Reflexes:     Reflex Scores:      Bicep reflexes are 2+ on the right side and 2+ on the left side.      Patellar reflexes are 2+ on the right side and 2+ on the left side.    Comments: Bilateral upper and lower extremity strength 5/5  Psychiatric:        Mood and Affect: Mood normal.        Behavior: Behavior normal.        Thought Content: Thought content normal.        Judgment: Judgment normal.    Title   Diabetic Foot Exam - detailed Is there a history of foot ulcer?: No Is there a foot ulcer now?: No Is there swelling?: No Is there elevated skin temperature?: No Is there abnormal foot shape?: No Is there a claw toe deformity?: No Are the toenails long?: No Are the toenails thick?: No Are the toenails ingrown?: No Is the skin thin, fragile, shiny and hairless?": No Pulse Foot Exam completed.: Yes   Right Posterior Tibialis: Present Left posterior Tibialis: Present   Right Dorsalis Pedis: Present Left Dorsalis Pedis: Present     Sensory Foot Exam Completed.: Yes Semmes-Weinstein Monofilament Test "+" means "has sensation" and "-" means "no sensation"      Image components are not supported.   Image components are not supported. Image components are not supported.  Tuning Fork Comments All 10 sites tested sensation intact bilaterally      Results for orders placed or performed in visit on 06/08/23  POCT glycosylated hemoglobin (Hb A1C)  Result Value Ref Range   Hemoglobin A1C 6.9 (A) 4.0 - 5.6 %   HbA1c POC (<> result, manual entry)     HbA1c, POC (prediabetic range)     HbA1c, POC (controlled diabetic range)        The 10-year ASCVD risk score (Arnett DK, et al., 2019) is: 4.6%    Assessment & Plan:   Problem List Items Addressed This Visit       Cardiovascular and Mediastinum   Hypertension associated with diabetes (HCC)   Patient currently maintained on valsartan -hydrochlorothiazide  160-12.5 mg.  Blood pressure  well-controlled.  Continue medication as prescribed        Endocrine   Uncontrolled type 2 diabetes mellitus with hyperglycemia (HCC)   Patient currently maintained on semaglutide  2 mg weekly, 1500 mg metformin  daily, glipizide  10 mg twice daily.  A1c is responded nicely currently  6.9%.  Continue checking glucose at home continue medication as prescribed      Relevant Orders   POCT glycosylated hemoglobin (Hb A1C) (Completed)     Other   Right foot pain   Query foreign body in foot pending x-ray if no foreign bodies in the podiatry for further evaluation      Relevant Orders   DG Foot 2 Views Right   Preventative health care - Primary   Discussed age-appropriate immunizations and screening exams.  Did review patient's personal, surgical, social, family histories.  Patient is up-to-date with all age-appropriate vaccinations she would like.  Get Prevnar 20 at local pharmacy.  Patient up-to-date on CRC screening and cervical cancer screening.  Order placed for mammogram today.  Patient was given information at discharge about the maintenance with anticipatory guidance      Other Visit Diagnoses       Screening mammogram for breast cancer       Relevant Orders   MM 3D SCREENING MAMMOGRAM BILATERAL BREAST       Return in about 3 months (around 09/08/2023) for DM recheck.    Margarie Shay, NP

## 2023-06-08 NOTE — Assessment & Plan Note (Signed)
 Patient currently maintained on valsartan -hydrochlorothiazide  160-12.5 mg.  Blood pressure well-controlled.  Continue medication as prescribed

## 2023-06-08 NOTE — Patient Instructions (Signed)
 Nice to see you today A1C was 6.9% which is great Follow up with me in 3 months, sooner if you need it I will be in touch wit the xray once I have the results

## 2023-06-08 NOTE — Assessment & Plan Note (Signed)
 Discussed age-appropriate immunizations and screening exams.  Did review patient's personal, surgical, social, family histories.  Patient is up-to-date with all age-appropriate vaccinations she would like.  Get Prevnar 20 at local pharmacy.  Patient up-to-date on CRC screening and cervical cancer screening.  Order placed for mammogram today.  Patient was given information at discharge about the maintenance with anticipatory guidance

## 2023-06-10 DIAGNOSIS — M5136 Other intervertebral disc degeneration, lumbar region with discogenic back pain only: Secondary | ICD-10-CM | POA: Diagnosis not present

## 2023-06-10 DIAGNOSIS — M5451 Vertebrogenic low back pain: Secondary | ICD-10-CM | POA: Diagnosis not present

## 2023-06-10 DIAGNOSIS — M47817 Spondylosis without myelopathy or radiculopathy, lumbosacral region: Secondary | ICD-10-CM | POA: Diagnosis not present

## 2023-06-10 DIAGNOSIS — G8929 Other chronic pain: Secondary | ICD-10-CM | POA: Diagnosis not present

## 2023-06-14 ENCOUNTER — Ambulatory Visit: Payer: Self-pay | Admitting: Nurse Practitioner

## 2023-06-14 DIAGNOSIS — M79671 Pain in right foot: Secondary | ICD-10-CM

## 2023-06-17 ENCOUNTER — Encounter: Payer: Self-pay | Admitting: Physical Therapy

## 2023-06-17 ENCOUNTER — Ambulatory Visit: Attending: Nurse Practitioner | Admitting: Physical Therapy

## 2023-06-17 DIAGNOSIS — M6281 Muscle weakness (generalized): Secondary | ICD-10-CM | POA: Insufficient documentation

## 2023-06-17 DIAGNOSIS — M5459 Other low back pain: Secondary | ICD-10-CM | POA: Diagnosis not present

## 2023-06-17 DIAGNOSIS — Z419 Encounter for procedure for purposes other than remedying health state, unspecified: Secondary | ICD-10-CM | POA: Diagnosis not present

## 2023-06-17 NOTE — Therapy (Signed)
 OUTPATIENT PHYSICAL THERAPY DAILY NOTE   Patient Name: [name] MRN: [id number] DOB:04-18-1970, 53 y.o., female Today's Date: 06/18/2023  END OF SESSION:  PT End of Session - 06/17/23 1619     Visit Number 4    Number of Visits 12    Date for PT Re-Evaluation 06/22/23    Authorization Type [employer] - Approved 10 visits 04/22/23-06/21/23    PT Start Time 1615    PT Stop Time 1656    PT Time Calculation (min) 41 min    Activity Tolerance Patient tolerated treatment well    Behavior During Therapy [facility] for tasks assessed/performed             Past Medical History:  Diagnosis Date   Back pain    Chronic kidney disease    kidney stones   Frequent headaches 03/08/2023   History of kidney stones    PONV (postoperative nausea and vomiting)    Poorly controlled type 2 diabetes mellitus (HCC)    Past Surgical History:  Procedure Laterality Date   CHOLECYSTECTOMY, LAPAROSCOPIC     CYSTOSCOPY WITH URETEROSCOPY AND STENT PLACEMENT Bilateral 07/29/2012   Procedure: CYSTOSCOPY WITH BILATERAL URETEROSCOPY AND BILATERAL STENT PLACEMENT, BASKET EXTRACTION OF BILATERAL URETERAL STONES;  Surgeon: Osborn Blaze, MD;  Location: [facility];  Service: Urology;  Laterality: Bilateral;   EXTRACORPOREAL SHOCK WAVE LITHOTRIPSY Right 08/12/2020   Procedure: RIGHT EXTRACORPOREAL SHOCK WAVE LITHOTRIPSY (ESWL);  Surgeon: Adelbert Homans, MD;  Location: [facility];  Service: Urology;  Laterality: Right;   HAND SURGERY  10/29/2020   Patient Active Problem List   Diagnosis Date Noted   Right foot pain 06/08/2023   Preventative health care 06/08/2023   Frequent headaches 03/08/2023   Paresthesias 03/08/2023   Acute cough 12/08/2022   Body aches 12/08/2022   COVID-19 12/08/2022   DOE (dyspnea on exertion) 12/08/2022   Obesity (BMI 30-39.9) 06/02/2022   Tension headache 03/24/2022   Uncontrolled type 2 diabetes mellitus with hyperglycemia (HCC) 03/04/2022   Hypertension  associated with diabetes (HCC) 03/04/2022   Skin lesion 03/04/2022   IUD (intrauterine device) in place 11/14/2020   Encounter for orthopedic follow-up care 11/08/2020   Carpal tunnel syndrome of right wrist 08/28/2020   Pain in right hand 08/28/2020   Lumbar pain 12/26/2019   Elevated blood pressure reading without diagnosis of hypertension 07/26/2019   Prediabetes 07/21/2018   Serum cholesterol elevated 07/21/2018   Pain in thoracic spine 06/22/2012   Lumbago 06/22/2012    PCP: Dorothe Gaster, NP   REFERRING PROVIDER: Glorious Larry, FNP  REFERRING DIAG: [phone] (ICD-10-CM) - Spondylosis without myelopathy or radiculopathy, lumbosacral region M54.16 (ICD-10-CM) - Radiculopathy, lumbar region M47.816 (ICD-10-CM) - Spondylosis without myelopathy or radiculopathy, lumbar region M51.369 (ICD-10-CM) - Degenerative disc disease, lumbar M54.51 (ICD-10-CM) - Vertebrogenic low back pain G89.29 (ICD-10-CM) - Other chronic pain  Rationale for Evaluation and Treatment: Rehabilitation  THERAPY DIAG:  Other low back pain  Muscle weakness (generalized)  ONSET DATE: 2+ years  SUBJECTIVE:  SUBJECTIVE STATEMENT:  06/18/2023: Pt reports continued low back pain averaging 8/10.  EVAL:  Describes a chronic history of low back w/o radicular symptoms.  Has had previous PT and chiropractic with no benefit.  DN worsens symptoms.  PERTINENT HISTORY:  03/25/2023: Patient presents with lumbar radiculopathy that may be caused by lumbar spondylosis, or possible protrusion of the lumbar intervertebral disc.   -Conservative management including NSAIDs and stretching exercises have not helped.   -I recommend a MRI-LS to properly evaluate the cause of back and leg pain symptoms for possible interventions or a surgical  consideration   -Will reassess in 4 weeks after the MRI to discuss the future plan of treatment.   -Pain has failed to improve with conservative care including rest, ice, heat, NSAIDs, topical pain relievers, physical therapy and home exercises.   PT 2023-2024 at [facility]   Previous Lspine Xray 2024 noted DDD at L3-5   Paitent to continue use of LSO as recommended and continue HEP as directed.    For the patient's neuropathic pain I will prescribe gabapentin 300 mg to be titrated upwards in a stepwise fashion every 7 days days until TID frequency is achieved.    Continue diclofenac  as needed, patient has script    PAIN:  Are you having pain? Yes: NPRS scale: 9.5/10 Pain location: low back Pain description: sharp Aggravating factors: prolonged positioning and activity Relieving factors: nothing  PRECAUTIONS: Back  RED FLAGS: None   WEIGHT BEARING RESTRICTIONS: No  FALLS:  Has patient fallen in last 6 months? No  OCCUPATION: not working  PLOF: Independent  PATIENT GOALS: To manage my back pain  NEXT MD VISIT: TBD  OBJECTIVE:  Note: Objective measures were completed at Evaluation unless otherwise noted.  DIAGNOSTIC FINDINGS:  CLINICAL DATA:  Chronic lower back pain after fall several years ago.   EXAM: LUMBAR SPINE - COMPLETE 4+ VIEW   COMPARISON:  None Available.   FINDINGS: There is no evidence of lumbar spine fracture. Alignment is normal. Mild degenerative disc disease is noted at L2-3 and L3-4 with anterior osteophyte formation.   IMPRESSION: Mild multilevel degenerative disc disease. No acute abnormality seen.     Electronically Signed   By: Rosalene Colon M.D.   On: 03/06/2022 12:26  PATIENT SURVEYS:  Modified Oswestry 38/50 (74% perceived disability)   MUSCLE LENGTH: Hamstrings: Right 90 deg; Left 90 deg   POSTURE: increased lumbar lordosis  PALPATION: deferred  LUMBAR ROM:   AROM eval  Flexion 50%P!  Extension 50%P!  Right lateral  flexion 50%P!  Left lateral flexion 50%P!  Right rotation   Left rotation    (Blank rows = not tested)  LOWER EXTREMITY ROM:   WFL for gait and transfers  Active  Right eval Left eval  Hip flexion    Hip extension    Hip abduction    Hip adduction    Hip internal rotation    Hip external rotation    Knee flexion    Knee extension    Ankle dorsiflexion    Ankle plantarflexion    Ankle inversion    Ankle eversion     (Blank rows = not tested)  LOWER EXTREMITY MMT:    MMT Right eval Left eval  Hip flexion    Hip extension    Hip abduction    Hip adduction    Hip internal rotation    Hip external rotation    Knee flexion    Knee extension  Ankle dorsiflexion    Ankle plantarflexion    Ankle inversion    Ankle eversion     (Blank rows = not tested)  LUMBAR SPECIAL TESTS:  Straight leg raise test: Negative, Slump test: Negative, and FABER test: Negative  FUNCTIONAL TESTS:  30 seconds chair stand test 3  GAIT: Distance walked: 2ftx2 Assistive device utilized: None Level of assistance: Complete Independence Comments: slow cadence, antalgic gait  TREATMENT DATE:   TREATMENT 06/17/23:  Aquatic therapy at [facility]- [address] - therapeutic pool temp 92 degrees Pt enters building independently.  Treatment took place in water 3.8 to  4 ft 8 in.feet deep depending upon activity.  Pt entered and exited the pool via stair and handrails    Aquatic Therapy:  Water walking for warm up fwd/lat/bkwds  Squats  Heel raises Hip abd Hip ext Straight leg hip flexion/ext HS stretch on step Step up fwd and lat Yellow noodle stomp - 2x10 Hip hinge with white barbell  Pt requires the buoyancy of water for active assisted exercises with buoyancy supported for strengthening and AROM exercises. Hydrostatic pressure also supports joints by unweighting joint load by at least 50 % in 3-4 feet depth water. 80% in chest to neck deep water. Water will provide  assistance with movement using the current and laminar flow while the buoyancy reduces weight bearing. Pt requires the viscosity of the water for resistance with strengthening exercises.  HOME EXERCISE PROGRAM: Access Code: [email or web address] URL: [email or web address] Date: 04/22/2023 Prepared by: Gretta Leavens  Exercises - Supine 90/90 Abdominal Bracing  - 2-3 x daily - 5 x weekly - 1 sets - 2 reps - 30s hold - Supine Quadratus Lumborum Stretch  - 2-3 x daily - 5 x weekly - 1 sets - 2 reps - 30s hold - Sit to Stand with Arms Crossed  - 1-2 x daily - 5 x weekly - 1 sets - 5 reps  ASSESSMENT:  CLINICAL IMPRESSION:  06/18/2023: Session today focused on hip and core strengthening in the aquatic environment for use of buoyancy to offload joints and the viscosity of water as resistance during therapeutic exercise.  Continued progressive loading with addition of lateral lunging and lateral step up today.  Pt reports no increase in pani with exercise.  Patient was able to tolerate all prescribed exercises in the aquatic environment with no adverse effects and reports 8/10 pain at the end of the session. Patient continues to benefit from skilled PT services on land and aquatic based and should be progressed as able to improve functional independence.    EVAL:   Patient is a 53 y.o. female who was seen today for physical therapy evaluation and treatment for chronic low back pain of vertebrogenic origin w/o radicular component.  Patient presents with marked restrictions in trunk ROM but LE ROM is WNL.  Palpation finds marked discomfort and tightness across lumbar paraspinals.  Neural tension tests did not elicit symptoms.  30s chair stand test limited to 3 reps due to pain.  Patient presents with high pain levels and S&S of mechanical low back pain.  OBJECTIVE IMPAIRMENTS: Abnormal gait, decreased activity tolerance, decreased endurance, decreased knowledge of condition, decreased mobility,  difficulty walking, decreased ROM, decreased strength, increased edema, increased fascial restrictions, increased muscle spasms, improper body mechanics, and pain.   ACTIVITY LIMITATIONS: carrying, lifting, bending, sitting, standing, squatting, sleeping, transfers, and bed mobility  PERSONAL FACTORS: Age, Behavior pattern, Fitness, Past/current experiences, and Time since onset of injury/illness/exacerbation are also affecting patient's  functional outcome.   REHAB POTENTIAL: Fair base on chronicity and failed conservative   CLINICAL DECISION MAKING: Evolving/moderate complexity  EVALUATION COMPLEXITY: Moderate   GOALS: Goals reviewed with patient? No  SHORT TERM GOALS: Target date: 05/13/2023  Patient to demonstrate independence in HEP  Baseline: [id number] Goal status: INITIAL  2.  Assess 2 MWT Baseline: TBD Goal status: INITIAL    LONG TERM GOALS: Target date: 06/03/2023    Patient will acknowledge 8/10 pain at least once during episode of care   Baseline: 9.5/10 Goal status: INITIAL  2.  Patient will increase 30s chair stand reps from 3 to 6 with/without arms to demonstrate and improved functional ability with less pain/difficulty as well as reduce fall risk.  Baseline: 3 Goal status: INITIAL  3.  Patient will score at least 30/50 on FOTO to signify clinically meaningful improvement in functional abilities.   Baseline: 38/50 Goal status: INITIAL  4.  Assess 2 MWT to note progress in strength and endurance Baseline:  Goal status: INITIAL  5.  Increase trunk ROM to 75% Baseline:  AROM eval  Flexion 50%P!  Extension 50%P!  Right lateral flexion 50%P!  Left lateral flexion 50%P!   Goal status: INITIAL    PLAN:  PT FREQUENCY: 2x/week  PT DURATION: 6 weeks  PLANNED INTERVENTIONS: 97164- PT Re-evaluation, 97110-Therapeutic exercises, 97530- Therapeutic activity, [id number]- Neuromuscular re-education, 97535- Self Care, 16109- Manual therapy, [id number]- Gait training,  [phone]- Aquatic Therapy, Stair training, Dry Needling, Spinal mobilization, and DME instructions.  PLAN FOR NEXT SESSION: HEP review and update, manual techniques as appropriate, aerobic tasks, ROM and flexibility activities, strengthening and PREs, TPDN, gait and balance training as needed    [employer] Authorization   Choose one: Rehabilitative  Standardized Assessment or Functional Outcome Tool: See Pain Assessment and Oswestry  Score or Percent Disability: 38/50  Body Parts Treated (Select each separately):  Lumbopelvic. Overall deficits/functional limitations for body part selected: moderate  If treatment provided at initial evaluation, no treatment charged due to lack of authorization.     Yetunde Leis E Yancarlos Berthold, PT 06/18/2023, 8:13 AM

## 2023-06-25 ENCOUNTER — Ambulatory Visit: Admitting: Podiatry

## 2023-06-25 ENCOUNTER — Ambulatory Visit (INDEPENDENT_AMBULATORY_CARE_PROVIDER_SITE_OTHER)

## 2023-06-25 DIAGNOSIS — M722 Plantar fascial fibromatosis: Secondary | ICD-10-CM

## 2023-06-25 MED ORDER — DICLOFENAC SODIUM 75 MG PO TBEC: 75.0000 mg | DELAYED_RELEASE_TABLET | Freq: Two times a day (BID) | ORAL | 2 refills | Status: DC

## 2023-06-25 MED ORDER — TRIAMCINOLONE ACETONIDE 10 MG/ML IJ SUSP
10.0000 mg | Freq: Once | INTRAMUSCULAR | Status: AC
  Administered 2023-06-25: 10 mg via INTRA_ARTICULAR

## 2023-06-29 NOTE — Progress Notes (Signed)
 Subjective:   Patient ID: [name], female   DOB: 53 y.o.   MRN: [id number]   HPI Patient states that she has had a lot of pain in the bottom of her right heel and it has been going on for several months and she does not remember injury to the area.  Patient does not currently smoke tries to be active   Review of Systems  All other systems reviewed and are negative.       Objective:  Physical Exam Vitals and nursing note reviewed.  Constitutional:      Appearance: She is well-developed.  Pulmonary:     Effort: Pulmonary effort is normal.   Musculoskeletal:        General: Normal range of motion.   Skin:    General: Skin is warm.   Neurological:     Mental Status: She is alert.     Neurovascular status intact muscle strength found to be adequate range of motion adequate with patient noted to have exquisite discomfort in the medial band of the plantar fascia right with fluid buildup noted around this area.  Moderate flatfoot noted patient is found to have good digital perfusion well-oriented     Assessment:  Acute plantar fasciitis right inflammation fluid of the medial band     Plan:  H&P reviewed and at this point I did sterile prep and injected the fascia 3 mg Kenalog  5 mg Xylocaine  and applied sterile dressing.  Instructed on shoe gear modifications and stretching exercises reappoint as symptoms indicate  X-rays indicate small spur no indication of stress fracture or arthritis

## 2023-06-30 ENCOUNTER — Ambulatory Visit
Admission: RE | Admit: 2023-06-30 | Discharge: 2023-06-30 | Disposition: A | Discharge status: Home / Self Care | Source: Ambulatory Visit | Attending: Nurse Practitioner

## 2023-06-30 DIAGNOSIS — Z1231 Encounter for screening mammogram for malignant neoplasm of breast: Secondary | ICD-10-CM

## 2023-07-05 NOTE — Therapy (Unsigned)
 OUTPATIENT PHYSICAL THERAPY RE-ASSESSMENT/DISCHARGE   Patient Name: [name] MRN: [id number] DOB:Oct 16, 1970, 53 y.o., female Today's Date: 07/06/2023  END OF SESSION:  PT End of Session - 07/06/23 1736     Visit Number 5    Number of Visits 12    Date for PT Re-Evaluation 06/22/23    Authorization Type [employer] - Approved 10 visits 04/22/23-06/21/23    Authorization - Visit Number 5    PT Start Time 1730    PT Stop Time 1800    PT Time Calculation (min) 30 min    Activity Tolerance Patient tolerated treatment well    Behavior During Therapy [facility] for tasks assessed/performed              Past Medical History:  Diagnosis Date   Back pain    Chronic kidney disease    kidney stones   Frequent headaches 03/08/2023   History of kidney stones    PONV (postoperative nausea and vomiting)    Poorly controlled type 2 diabetes mellitus (HCC)    Past Surgical History:  Procedure Laterality Date   CHOLECYSTECTOMY, LAPAROSCOPIC     CYSTOSCOPY WITH URETEROSCOPY AND STENT PLACEMENT Bilateral 07/29/2012   Procedure: CYSTOSCOPY WITH BILATERAL URETEROSCOPY AND BILATERAL STENT PLACEMENT, BASKET EXTRACTION OF BILATERAL URETERAL STONES;  Surgeon: Ricardo Likens, MD;  Location: [facility];  Service: Urology;  Laterality: Bilateral;   EXTRACORPOREAL SHOCK WAVE LITHOTRIPSY Right 08/12/2020   Procedure: RIGHT EXTRACORPOREAL SHOCK WAVE LITHOTRIPSY (ESWL);  Surgeon: Devere Lonni Righter, MD;  Location: [facility];  Service: Urology;  Laterality: Right;   HAND SURGERY  10/29/2020   Patient Active Problem List   Diagnosis Date Noted   Right foot pain 06/08/2023   Preventative health care 06/08/2023   Frequent headaches 03/08/2023   Paresthesias 03/08/2023   Acute cough 12/08/2022   Body aches 12/08/2022   COVID-19 12/08/2022   DOE (dyspnea on exertion) 12/08/2022   Obesity (BMI 30-39.9) 06/02/2022   Tension headache 03/24/2022   Uncontrolled type 2 diabetes mellitus  with hyperglycemia (HCC) 03/04/2022   Hypertension associated with diabetes (HCC) 03/04/2022   Skin lesion 03/04/2022   IUD (intrauterine device) in place 11/14/2020   Encounter for orthopedic follow-up care 11/08/2020   Carpal tunnel syndrome of right wrist 08/28/2020   Pain in right hand 08/28/2020   Lumbar pain 12/26/2019   Elevated blood pressure reading without diagnosis of hypertension 07/26/2019   Prediabetes 07/21/2018   Serum cholesterol elevated 07/21/2018   Pain in thoracic spine 06/22/2012   Lumbago 06/22/2012    PCP: Wendee Lynwood HERO, NP   REFERRING PROVIDER: Garnell Harlene CROME, FNP  REFERRING DIAG: [phone] (ICD-10-CM) - Spondylosis without myelopathy or radiculopathy, lumbosacral region M54.16 (ICD-10-CM) - Radiculopathy, lumbar region M47.816 (ICD-10-CM) - Spondylosis without myelopathy or radiculopathy, lumbar region M51.369 (ICD-10-CM) - Degenerative disc disease, lumbar M54.51 (ICD-10-CM) - Vertebrogenic low back pain G89.29 (ICD-10-CM) - Other chronic pain  Rationale for Evaluation and Treatment: Rehabilitation  THERAPY DIAG:  Other low back pain  Muscle weakness (generalized)  ONSET DATE: 2+ years  SUBJECTIVE:  SUBJECTIVE STATEMENT: No changes in pain or function reported.  Symptom intensity 10/10 at times.  Did not feel any benefit from aquatic PT.  Has been compliant with HEP despite elevated discomfort levels from stretching.  EVAL:  Describes a chronic history of low back w/o radicular symptoms.  Has had previous PT and chiropractic with no benefit.  DN worsens symptoms.  PERTINENT HISTORY:  03/25/2023: Patient presents with lumbar radiculopathy that may be caused by lumbar spondylosis, or possible protrusion of the lumbar intervertebral disc.   -Conservative management  including NSAIDs and stretching exercises have not helped.   -I recommend a MRI-LS to properly evaluate the cause of back and leg pain symptoms for possible interventions or a surgical consideration   -Will reassess in 4 weeks after the MRI to discuss the future plan of treatment.   -Pain has failed to improve with conservative care including rest, ice, heat, NSAIDs, topical pain relievers, physical therapy and home exercises.   PT 2023-2024 at [facility]   Previous Lspine Xray 2024 noted DDD at L3-5   Paitent to continue use of LSO as recommended and continue HEP as directed.    For the patient's neuropathic pain I will prescribe gabapentin 300 mg to be titrated upwards in a stepwise fashion every 7 days days until TID frequency is achieved.    Continue diclofenac  as needed, patient has script    PAIN:  Are you having pain? Yes: NPRS scale: 9.5/10 Pain location: low back Pain description: sharp Aggravating factors: prolonged positioning and activity Relieving factors: nothing  PRECAUTIONS: Back  RED FLAGS: None   WEIGHT BEARING RESTRICTIONS: No  FALLS:  Has patient fallen in last 6 months? No  OCCUPATION: not working  PLOF: Independent  PATIENT GOALS: To manage my back pain  NEXT MD VISIT: TBD  OBJECTIVE:  Note: Objective measures were completed at Evaluation unless otherwise noted.  DIAGNOSTIC FINDINGS:  CLINICAL DATA:  Chronic lower back pain after fall several years ago.   EXAM: LUMBAR SPINE - COMPLETE 4+ VIEW   COMPARISON:  None Available.   FINDINGS: There is no evidence of lumbar spine fracture. Alignment is normal. Mild degenerative disc disease is noted at L2-3 and L3-4 with anterior osteophyte formation.   IMPRESSION: Mild multilevel degenerative disc disease. No acute abnormality seen.     Electronically Signed   By: Lynwood Landy Raddle M.D.   On: 03/06/2022 12:26  PATIENT SURVEYS:  Modified Oswestry 38/50 (74% perceived disability)  07/06/23 34/50  68% perceived disability  MUSCLE LENGTH: Hamstrings: Right 90 deg; Left 90 deg   POSTURE: increased lumbar lordosis  PALPATION: deferred  LUMBAR ROM:   AROM eval 07/06/23  Flexion 50%P! 50%P!  Extension 50%P! 10%P!  Right lateral flexion 50%P! 25%P!  Left lateral flexion 50%P! 25%P!  Right rotation    Left rotation     (Blank rows = not tested)  LOWER EXTREMITY ROM:   WFL for gait and transfers  Active  Right eval Left eval  Hip flexion    Hip extension    Hip abduction    Hip adduction    Hip internal rotation    Hip external rotation    Knee flexion    Knee extension    Ankle dorsiflexion    Ankle plantarflexion    Ankle inversion    Ankle eversion     (Blank rows = not tested)  LOWER EXTREMITY MMT:    MMT Right eval Left eval  Hip flexion    Hip  extension    Hip abduction    Hip adduction    Hip internal rotation    Hip external rotation    Knee flexion    Knee extension    Ankle dorsiflexion    Ankle plantarflexion    Ankle inversion    Ankle eversion     (Blank rows = not tested)  LUMBAR SPECIAL TESTS:  Straight leg raise test: Negative, Slump test: Negative, and FABER test: Negative  FUNCTIONAL TESTS:  30 seconds chair stand test 3; 07/06/23 7   GAIT: Distance walked: 82ftx2 Assistive device utilized: None Level of assistance: Complete Independence Comments: slow cadence, antalgic gait; 07/06/23 unchnged  TREATMENT DATE:   TREATMENT 06/17/23:  Aquatic therapy at [facility]- [address] - therapeutic pool temp 92 degrees Pt enters building independently.  Treatment took place in water 3.8 to  4 ft 8 in.feet deep depending upon activity.  Pt entered and exited the pool via stair and handrails    Aquatic Therapy:  Water walking for warm up fwd/lat/bkwds  Squats  Heel raises Hip abd Hip ext Straight leg hip flexion/ext HS stretch on step Step up fwd and lat Yellow noodle stomp - 2x10 Hip hinge with white barbell  Pt  requires the buoyancy of water for active assisted exercises with buoyancy supported for strengthening and AROM exercises. Hydrostatic pressure also supports joints by unweighting joint load by at least 50 % in 3-4 feet depth water. 80% in chest to neck deep water. Water will provide assistance with movement using the current and laminar flow while the buoyancy reduces weight bearing. Pt requires the viscosity of the water for resistance with strengthening exercises.  HOME EXERCISE PROGRAM: Access Code: [email or web address] URL: [email or web address] Date: 04/22/2023 Prepared by: Reyes Kohut  Exercises - Supine 90/90 Abdominal Bracing  - 2-3 x daily - 5 x weekly - 1 sets - 2 reps - 30s hold - Supine Quadratus Lumborum Stretch  - 2-3 x daily - 5 x weekly - 1 sets - 2 reps - 30s hold - Sit to Stand with Arms Crossed  - 1-2 x daily - 5 x weekly - 1 sets - 5 reps  ASSESSMENT:  CLINICAL IMPRESSION: No change in pain levels reported.  Aquatic PT did not benefit patient.  Previous OPPT including TPDN unsuccessful and patient declines additional DN.  Patient has met goal of 30s chair stand but pain levels and ROM have regressed.   EVAL:   Patient is a 53 y.o. female who was seen today for physical therapy evaluation and treatment for chronic low back pain of vertebrogenic origin w/o radicular component.  Patient presents with marked restrictions in trunk ROM but LE ROM is WNL.  Palpation finds marked discomfort and tightness across lumbar paraspinals.  Neural tension tests did not elicit symptoms.  30s chair stand test limited to 3 reps due to pain.  Patient presents with high pain levels and S&S of mechanical low back pain.  OBJECTIVE IMPAIRMENTS: Abnormal gait, decreased activity tolerance, decreased endurance, decreased knowledge of condition, decreased mobility, difficulty walking, decreased ROM, decreased strength, increased edema, increased fascial restrictions, increased muscle spasms,  improper body mechanics, and pain.   ACTIVITY LIMITATIONS: carrying, lifting, bending, sitting, standing, squatting, sleeping, transfers, and bed mobility  PERSONAL FACTORS: Age, Behavior pattern, Fitness, Past/current experiences, and Time since onset of injury/illness/exacerbation are also affecting patient's functional outcome.   REHAB POTENTIAL: Fair base on chronicity and failed conservative   CLINICAL DECISION MAKING: Evolving/moderate complexity  EVALUATION COMPLEXITY: Moderate  GOALS: Goals reviewed with patient? No  SHORT TERM GOALS: Target date: 05/13/2023  Patient to demonstrate independence in HEP  Baseline: [id number] Goal status: Reports compliance  2.  Assess 2 MWT Baseline: TBD; 07/06/23 11ft Goal status: INITIAL    LONG TERM GOALS: Target date: 06/03/2023    Patient will acknowledge 8/10 pain at least once during episode of care   Baseline: 9.5/10; 07/06/23 Average pain 8/10, 10/10 at times Goal status: Not met  2.  Patient will increase 30s chair stand reps from 3 to 6 with/without arms to demonstrate and improved functional ability with less pain/difficulty as well as reduce fall risk.  Baseline: 3; 07/06/23 7 Goal status: Met  3.  Patient will score at least 30/50 on ODI to signify clinically meaningful improvement in functional abilities.   Baseline: 38/50; 07/06/23 34/50 Goal status: Not met  4.  Assess 2 MWT to note progress in strength and endurance Baseline: ; 07/06/23 141ft Goal status: Met  5.  Increase trunk ROM to 75% Baseline:  AROM eval 07/06/23  Flexion 50%P! 50%P!  Extension 50%P! 10%P!  Right lateral flexion 50%P! 25%P!  Left lateral flexion 50%P! 25%P!   Goal status: Not met    PLAN:  PT FREQUENCY: 1x/week  PT DURATION: 1 week  PLANNED INTERVENTIONS: 97164- PT Re-evaluation, 97110-Therapeutic exercises, 97530- Therapeutic activity, [id number]- Neuromuscular re-education, 97535- Self Care, 02859- Manual therapy, [id number]- Gait training,  [phone]- Aquatic Therapy, Stair training, Dry Needling, Spinal mobilization, and DME instructions.  PLAN FOR NEXT SESSION: HEP review and update, manual techniques as appropriate, aerobic tasks, ROM and flexibility activities, strengthening and PREs, TPDN, gait and balance training as needed    [employer] Authorization   Choose one: Rehabilitative  Standardized Assessment or Functional Outcome Tool: See Pain Assessment and Oswestry  Score or Percent Disability: 38/50  Body Parts Treated (Select each separately):  Lumbopelvic. Overall deficits/functional limitations for body part selected: moderate  If treatment provided at initial evaluation, no treatment charged due to lack of authorization.     Hiren Peplinski M Noal Abshier, PT 07/06/2023, 5:56 PM

## 2023-07-06 ENCOUNTER — Ambulatory Visit: Attending: Nurse Practitioner

## 2023-07-06 DIAGNOSIS — M6281 Muscle weakness (generalized): Secondary | ICD-10-CM | POA: Diagnosis present

## 2023-07-06 DIAGNOSIS — M5459 Other low back pain: Secondary | ICD-10-CM | POA: Insufficient documentation

## 2023-08-04 ENCOUNTER — Other Ambulatory Visit: Payer: Self-pay | Admitting: Nurse Practitioner

## 2023-08-04 DIAGNOSIS — M47817 Spondylosis without myelopathy or radiculopathy, lumbosacral region: Secondary | ICD-10-CM

## 2023-08-04 LAB — HM DIABETES EYE EXAM

## 2023-08-12 ENCOUNTER — Other Ambulatory Visit

## 2023-08-16 ENCOUNTER — Telehealth: Payer: Self-pay

## 2023-08-16 NOTE — Telephone Encounter (Signed)
 Form printed and placed in [name]'s inbox in his office.

## 2023-08-16 NOTE — Telephone Encounter (Signed)
 Copied from CRM #[id number]. Topic: General - Other >> Aug 16, 2023  8:32 AM Mia F wrote: Reason for CRM: Handicap sticker expires this month and pt need a new form completed. Please advise

## 2023-08-18 ENCOUNTER — Other Ambulatory Visit: Payer: Self-pay | Admitting: Dermatology

## 2023-08-18 ENCOUNTER — Telehealth: Payer: Self-pay | Admitting: Nurse Practitioner

## 2023-08-18 ENCOUNTER — Ambulatory Visit (INDEPENDENT_AMBULATORY_CARE_PROVIDER_SITE_OTHER): Admitting: Dermatology

## 2023-08-18 ENCOUNTER — Encounter: Payer: Self-pay | Admitting: Dermatology

## 2023-08-18 VITALS — BP 117/75 | HR 106

## 2023-08-18 DIAGNOSIS — R209 Unspecified disturbances of skin sensation: Secondary | ICD-10-CM | POA: Insufficient documentation

## 2023-08-18 DIAGNOSIS — R200 Anesthesia of skin: Secondary | ICD-10-CM | POA: Insufficient documentation

## 2023-08-18 DIAGNOSIS — M5451 Vertebrogenic low back pain: Secondary | ICD-10-CM | POA: Insufficient documentation

## 2023-08-18 DIAGNOSIS — E854 Organ-limited amyloidosis: Secondary | ICD-10-CM

## 2023-08-18 DIAGNOSIS — L99 Other disorders of skin and subcutaneous tissue in diseases classified elsewhere: Secondary | ICD-10-CM | POA: Diagnosis not present

## 2023-08-18 DIAGNOSIS — M51369 Other intervertebral disc degeneration, lumbar region without mention of lumbar back pain or lower extremity pain: Secondary | ICD-10-CM | POA: Insufficient documentation

## 2023-08-18 DIAGNOSIS — M47817 Spondylosis without myelopathy or radiculopathy, lumbosacral region: Secondary | ICD-10-CM | POA: Insufficient documentation

## 2023-08-18 DIAGNOSIS — L810 Postinflammatory hyperpigmentation: Secondary | ICD-10-CM | POA: Diagnosis not present

## 2023-08-18 MED ORDER — TACROLIMUS 0.1 % EX OINT: TOPICAL_OINTMENT | Freq: Two times a day (BID) | CUTANEOUS | 9 refills | Status: AC

## 2023-08-18 NOTE — Progress Notes (Signed)
   Follow-Up Visit   Subjective  [name] is a 53 y.o. female who presents for the following: Lichen amyloidosis   Patient present today for follow up visit for Lichen amyloidosis. Patient was last evaluated on 04/14/23. At this visit patient was prescribed Clobetasol  and tacrolimus  to alternate 2 weeks on and 2 weeks off. She was also recommended to moisturize daily with Cerave Anti-itch lotion also Aveeno Oatmeal baths. Patient reports sxs are improving. She has only been alternating clobetasol  and tacrolimus . Patient rates her itch 0 out of 10. Patient denies medication changes.  The following portions of the chart were reviewed this encounter and updated as appropriate: medications, allergies, medical history  Review of Systems:  No other skin or systemic complaints except as noted in HPI or Assessment and Plan.  Objective  Well appearing patient in no apparent distress; mood and affect are within normal limits.  A focused examination was performed of the following areas: B/L LE  Relevant exam findings are noted in the Assessment and Plan.                 Assessment & Plan   Lichen Amyloidosis (improved) and PIH - Assessment: Patient has shown significant improvement in the dermatological condition affecting her legs. Previous treatment included alternating clobetasol  and tacrolimus . Current examination reveals smooth skin with some areas of hypopigmentation, likely due to clobetasol  use. Concerned about potential skin thinning and stretch marks from continued clobetasol  use, a modification in treatment is warranted.  - Plan:    Discontinue clobetasol  to prevent further skin thinning and hypopigmentation    Continue tacrolimus  0.1% ointment twice daily    Start Urea-containing moisturizer (La Roche-Posay Lipikar with Urea) for exfoliation and smoothing    Mix tacrolimus  with urea-containing moisturizer or apply separately    Refill tacrolimus  prescription    Provide sample  of La Roche-Posay Lipikar with Urea moisturizer  Follow up in 4 months.    Return in about 5 months (around 01/18/2024) for Lichen amyloidosis F/U.  I, Jetta Ager, am acting as Neurosurgeon for [employer], DO.  Documentation: I have reviewed the above documentation for accuracy and completeness, and I agree with the above.  Delon Lenis, DO

## 2023-08-18 NOTE — Telephone Encounter (Signed)
 Error

## 2023-08-18 NOTE — Patient Instructions (Addendum)
 Date: Wed Aug 18 2023  Hello Camie,  Thank you for visiting today. Here is a summary of the key instructions:  - Medications:   - Apply tacrolimus  ointment (0.1%) twice daily to affected areas   - Mix tacrolimus  with urea moisturizer or apply one after the other  - Skin Care:   - Use La Roche-Posay Lipikar moisturizer with urea daily   - Stop using clobetasol   - Follow-up:   - Return for follow-up appointment in 4 months  - Other Instructions:   - Avoid skin contact between medication and others, especially children   - Wash hands after applying medication  Please reach out if you have any questions or concerns.  Warm regards,  Dr. Delon Lenis Dermatology     Important Information   Due to recent changes in healthcare laws, you may see results of your pathology and/or laboratory studies on MyChart before the doctors have had a chance to review them. We understand that in some cases there may be results that are confusing or concerning to you. Please understand that not all results are received at the same time and often the doctors may need to interpret multiple results in order to provide you with the best plan of care or course of treatment. Therefore, we ask that you please give us  2 business days to thoroughly review all your results before contacting the office for clarification. Should we see a critical lab result, you will be contacted sooner.     If You Need Anything After Your Visit   If you have any questions or concerns for your doctor, please call our main line at [phone]. If no one answers, please leave a voicemail as directed and we will return your call as soon as possible. Messages left after 4 pm will be answered the following business day.    You may also send us  a message via MyChart. We typically respond to MyChart messages within 1-2 business days.  For prescription refills, please ask your pharmacy to contact our office. Our fax number is  [phone].  If you have an urgent issue when the clinic is closed that cannot wait until the next business day, you can page your doctor at the number below.     Please note that while we do our best to be available for urgent issues outside of office hours, we are not available 24/7.    If you have an urgent issue and are unable to reach us , you may choose to seek medical care at your doctor's office, retail clinic, urgent care center, or emergency room.   If you have a medical emergency, please immediately call 911 or go to the emergency department. In the event of inclement weather, please call our main line at [phone] for an update on the status of any delays or closures.  Dermatology Medication Tips: Please keep the boxes that topical medications come in in order to help keep track of the instructions about where and how to use these. Pharmacies typically print the medication instructions only on the boxes and not directly on the medication tubes.   If your medication is too expensive, please contact our office at [phone] or send us  a message through MyChart.    We are unable to tell what your co-pay for medications will be in advance as this is different depending on your insurance coverage. However, we may be able to find a substitute medication at lower cost or fill out paperwork to get  insurance to cover a needed medication.    If a prior authorization is required to get your medication covered by your insurance company, please allow us  1-2 business days to complete this process.   Drug prices often vary depending on where the prescription is filled and some pharmacies may offer cheaper prices.   The website www.goodrx.com contains coupons for medications through different pharmacies. The prices here do not account for what the cost may be with help from insurance (it may be cheaper with your insurance), but the website can give you the price if you did not use any  insurance.  - You can print the associated coupon and take it with your prescription to the pharmacy.  - You may also stop by our office during regular business hours and pick up a GoodRx coupon card.  - If you need your prescription sent electronically to a different pharmacy, notify our office through [facility] or by phone at [phone]

## 2023-08-20 NOTE — Telephone Encounter (Signed)
 Completed and placed in outgoing MA box

## 2023-08-20 NOTE — Telephone Encounter (Signed)
 Left detailed voicemail for patient to call the office back if any concerns.

## 2023-08-23 NOTE — Telephone Encounter (Signed)
 Pt came in and picked up form.

## 2023-08-30 ENCOUNTER — Other Ambulatory Visit (HOSPITAL_COMMUNITY): Payer: Self-pay

## 2023-08-30 ENCOUNTER — Telehealth: Payer: Self-pay

## 2023-08-30 NOTE — Telephone Encounter (Signed)
 Pharmacy Patient Advocate Encounter   Received notification from Onbase that prior authorization for Ozempic  8 is required/requested.   Insurance verification completed.   The patient is insured through [facility] .   Per test claim: PA required; PA submitted to above mentioned insurance via Latent Key/confirmation #/EOC [facility] Status is pending

## 2023-08-31 ENCOUNTER — Other Ambulatory Visit (HOSPITAL_COMMUNITY): Payer: Self-pay

## 2023-08-31 NOTE — Telephone Encounter (Signed)
 Pharmacy Patient Advocate Encounter  Received notification from [facility] that Prior Authorization for Ozempic  8 has been APPROVED from 08/30/23 to 08/29/24. Ran test claim, Copay is $971.90 due to deductible. This test claim was processed through [facility]- copay amounts may vary at other pharmacies due to pharmacy/plan contracts, or as the patient moves through the different stages of their insurance plan.     PA #/Case ID/Reference #: # [id number]

## 2023-09-01 ENCOUNTER — Encounter: Payer: Self-pay | Admitting: Nurse Practitioner

## 2023-09-02 ENCOUNTER — Ambulatory Visit
Admission: RE | Admit: 2023-09-02 | Discharge: 2023-09-02 | Disposition: A | Discharge status: Home / Self Care | Source: Ambulatory Visit | Attending: Nurse Practitioner | Admitting: Nurse Practitioner

## 2023-09-02 DIAGNOSIS — M4807 Spinal stenosis, lumbosacral region: Secondary | ICD-10-CM | POA: Diagnosis not present

## 2023-09-02 DIAGNOSIS — M47817 Spondylosis without myelopathy or radiculopathy, lumbosacral region: Secondary | ICD-10-CM | POA: Diagnosis not present

## 2023-09-02 DIAGNOSIS — M5126 Other intervertebral disc displacement, lumbar region: Secondary | ICD-10-CM | POA: Diagnosis not present

## 2023-09-10 ENCOUNTER — Telehealth: Payer: Self-pay | Admitting: Nurse Practitioner

## 2023-09-10 ENCOUNTER — Ambulatory Visit: Admitting: Nurse Practitioner

## 2023-09-10 ENCOUNTER — Telehealth: Payer: Self-pay

## 2023-09-10 VITALS — BP 100/76 | HR 87 | Temp 97.9°F | Ht 59.0 in | Wt 165.8 lb

## 2023-09-10 DIAGNOSIS — E1165 Type 2 diabetes mellitus with hyperglycemia: Secondary | ICD-10-CM | POA: Diagnosis not present

## 2023-09-10 DIAGNOSIS — Z7984 Long term (current) use of oral hypoglycemic drugs: Secondary | ICD-10-CM

## 2023-09-10 DIAGNOSIS — Z7985 Long-term (current) use of injectable non-insulin antidiabetic drugs: Secondary | ICD-10-CM

## 2023-09-10 DIAGNOSIS — Z23 Encounter for immunization: Secondary | ICD-10-CM

## 2023-09-10 LAB — POCT GLYCOSYLATED HEMOGLOBIN (HGB A1C): Hemoglobin A1C: 6.8 % — AB (ref 4.0–5.6)

## 2023-09-10 NOTE — Progress Notes (Signed)
 Established Patient Office Visit  Subjective   Patient ID: [name], female    DOB: 02-12-1970  Age: 53 y.o. MRN: [id number]  Chief Complaint  Patient presents with   Follow-up    HPI  Discussed the use of AI scribe software for clinical note transcription with the patient, who gave verbal consent to proceed.  History of Present Illness [name] [name] is a 53 year old female with diabetes who presents for a follow-up visit.  She manages her diabetes with Ozempic  2 mg weekly, metformin  500mg  (two tablets in the morning and one in the evening), and glipizide  10 mg (one tablet twice a day). She checks her blood sugar at home about once a week, with the most recent reading being 133 mg/dL in the morning. Her HbA1c has decreased from 6.9% to 6.8%, maintaining it under 7%.  Her diet is variable, often eating twice a day, with smaller, more frequent meals after her Ozempic  injection. She experiences increased hunger, particularly a few days after the injection, leading to more frequent snacking on items like bananas and oranges.  She is experiencing issues with her insurance coverage for Ozempic , which is not covered under her new plan, resulting in a high out-of-pocket cost. She has attempted to apply for savings programs but finds the cost still prohibitive.  She reports numbness on her left side, which has increased from once or twice a week to daily occurrences, affecting her sleep and daily activities. An MRI was recently performed, but the results are pending. She is scheduled to see Harlene Biles next week to discuss these symptoms further.  No issues with urination or defecation, although she notes difficulty in self-care due to numbness. She experiences discomfort while sleeping due to the numbness.    Review of Systems  Constitutional:  Negative for chills and fever.  Respiratory:  Negative for shortness of breath.   Cardiovascular:  Negative for chest pain.   Musculoskeletal:  Positive for back pain.  Neurological:  Positive for tingling. Negative for weakness and headaches.      Objective:     BP 100/76   Pulse 87   Temp 97.9 F (36.6 C) (Oral)   Ht 4' 11 (1.499 m)   Wt 165 lb 12.8 oz (75.2 kg)   SpO2 96%   BMI 33.49 kg/m  BP Readings from Last 3 Encounters:  09/10/23 100/76  08/18/23 117/75  06/08/23 132/82   Wt Readings from Last 3 Encounters:  09/10/23 165 lb 12.8 oz (75.2 kg)  06/08/23 165 lb 12.8 oz (75.2 kg)  03/08/23 166 lb 12.8 oz (75.7 kg)   SpO2 Readings from Last 3 Encounters:  09/10/23 96%  06/08/23 96%  03/08/23 95%      Physical Exam Vitals and nursing note reviewed.  Constitutional:      Appearance: Normal appearance.  Cardiovascular:     Rate and Rhythm: Normal rate and regular rhythm.     Pulses:          Dorsalis pedis pulses are 2+ on the left side.     Heart sounds: Normal heart sounds.  Pulmonary:     Effort: Pulmonary effort is normal.     Breath sounds: Normal breath sounds.  Neurological:     General: No focal deficit present.     Mental Status: She is alert.     Deep Tendon Reflexes:     Reflex Scores:      Patellar reflexes are 1+ on the right  side and 2+ on the left side.    Comments: Bilateral lower extremity pain 5/5      Results for orders placed or performed in visit on 09/10/23  POCT glycosylated hemoglobin (Hb A1C)  Result Value Ref Range   Hemoglobin A1C 6.8 (A) 4.0 - 5.6 %   HbA1c POC (<> result, manual entry)     HbA1c, POC (prediabetic range)     HbA1c, POC (controlled diabetic range)        The 10-year ASCVD risk score (Arnett DK, et al., 2019) is: 2.7%    Assessment & Plan:   Problem List Items Addressed This Visit       Endocrine   Uncontrolled type 2 diabetes mellitus with hyperglycemia (HCC) - Primary   Relevant Orders   POCT glycosylated hemoglobin (Hb A1C) (Completed)   AMB Referral VBCI Care Management  Assessment and Plan Assessment &  Plan Type 2 diabetes mellitus with hyperglycemia Managed with semaglutide  2 mg weekly, metformin  1500mg  daily, and glipizide  10 mg BID. Hemoglobin A1c improved to 6.8%, below target. Reports hunger fluctuations post semaglutide  injection. Insurance issues with semaglutide  coverage causing high costs. - Refer to pharmacist for semaglutide  patient assistance program. - Consider alternative medications if cost issues persist, follow up in 3 months. - Follow up in 6 months if cost issues are resolved.  Left lower extremity numbness likely due to lumbar radiculopathy Symptoms increased in frequency, affecting sleep and activities. Awaiting MRI report. Neurological exam suggests lumbar nerve compression. - Contact neurologist Harlene Biles to report increased numbness. - Await MRI report for further evaluation.  General Health Maintenance Discussed flu vaccination. - Administer flu shot.  Follow-Up Discussed follow-up plans for diabetes management and medication cost issues. - Follow up in 6 months for diabetes management if semaglutide  cost issues are resolved. - Follow up in 3 months if medication changes are needed due to semaglutide  cost issues. - Coordinate with pharmacist for semaglutide  patient assistance program. - Send message to provider regarding spouse's semaglutide  cost issues for similar assistance.   Return in about 6 months (around 03/09/2024) for DM recheck.    Adina Crandall, NP

## 2023-09-10 NOTE — Telephone Encounter (Signed)
 Pt would like for someone in clinical to give he a call about Ozempic ..  Please advise, Thanks

## 2023-09-10 NOTE — Progress Notes (Signed)
 Care Guide Pharmacy Note  09/10/2023 Name: [name] MRN: [id number] DOB: 1970/05/11  Referred By: Wendee Lynwood HERO, NP Reason for referral: Complex Care Management and Call Attempt #1 (Successful initial outreach to schedule with PHARM D- Manuelita )   [name] is a 53 y.o. year old female who is a primary care patient of Wendee Lynwood HERO, NP.  [name] was referred to the pharmacist for assistance related to: DMII  Successful contact was made with the patient to discuss pharmacy services including being ready for the pharmacist to call at least 5 minutes before the scheduled appointment time and to have medication bottles and any blood pressure readings ready for review. The patient agreed to meet with the pharmacist via telephone visit on (date/time). 09/16/23 @ 1 PM  Leotis Rase [facility], [facility] Guide  Direct Dial: [phone]  Fax [phone]

## 2023-09-10 NOTE — Patient Instructions (Signed)
 Nice to see you today  We did update your flu shot today I have sent a message for the pharmacy team to reach out and see about helping with the cost of the ozempic   Follow up with me in 6 months If we change medications it will be a 3 month follow up

## 2023-09-10 NOTE — Telephone Encounter (Signed)
 Error

## 2023-09-13 NOTE — Telephone Encounter (Signed)
 Can we call and see what she is needing. I see they have her scheduled with the pharmacist on the 11th

## 2023-09-15 DIAGNOSIS — M5416 Radiculopathy, lumbar region: Secondary | ICD-10-CM | POA: Diagnosis not present

## 2023-09-16 ENCOUNTER — Other Ambulatory Visit (HOSPITAL_COMMUNITY): Payer: Self-pay

## 2023-09-16 ENCOUNTER — Other Ambulatory Visit (INDEPENDENT_AMBULATORY_CARE_PROVIDER_SITE_OTHER): Admitting: Pharmacist

## 2023-09-16 DIAGNOSIS — E1165 Type 2 diabetes mellitus with hyperglycemia: Secondary | ICD-10-CM

## 2023-09-16 NOTE — Progress Notes (Signed)
   09/20/2023 Name: [name] MRN: [id number] DOB: 26-Sep-1970  Subjective  No chief complaint on file.   Care Team: Primary Care Provider: Wendee Lynwood HERO, NP  Reason for visit: ?  [name] is a 53 y.o. female who presents today for a telephone visit with the pharmacist due to medication access concerns regarding their Ozempic . ?   Medication Access: ?  Reports that all medications are not affordable.  Ozempic  works well but her out of pocket cost is 900$ Had Medicaid previously so Ozempic  was more affordable though more recently has lost eligibility    Prescription drug coverage: YES Payor: BLUE CROSS BLUE SHIELD / Plan: BCBS COMM PPO / Product Type: *No Product type* / .    Current Patient Assistance: N/A - commercial insurance through husband's employer   Assessment and Plan:   1. Medication Access Ozempic  test claim:  1 box = $152.28 through insurance. With Copay card -$100 = $52/month 3 box = $74.99/3 month with e-Voucher applied on test claim Second option = Copay card     Future Appointments  Date Time Provider Department Center  02/03/2024  4:15 PM Alm Delon SAILOR, DO [facility] None  03/10/2024 10:00 AM Wendee Lynwood HERO, NP [facility] [id number]    Manuelita FABIENE Kobs, PharmD Clinical Pharmacist [facility] [phone]

## 2023-09-20 ENCOUNTER — Other Ambulatory Visit (HOSPITAL_COMMUNITY): Payer: Self-pay

## 2023-09-20 ENCOUNTER — Encounter: Payer: Self-pay | Admitting: Pharmacist

## 2023-09-20 ENCOUNTER — Other Ambulatory Visit: Payer: Self-pay

## 2023-09-20 MED ORDER — SEMAGLUTIDE (2 MG/DOSE) 8 MG/3ML ~~LOC~~ SOPN
2.0000 mg | PEN_INJECTOR | SUBCUTANEOUS | 1 refills | Status: DC
  Filled 2023-09-20: qty 9, 84d supply, fill #0

## 2023-09-21 ENCOUNTER — Other Ambulatory Visit: Payer: Self-pay | Admitting: Pharmacist

## 2023-09-21 DIAGNOSIS — E1165 Type 2 diabetes mellitus with hyperglycemia: Secondary | ICD-10-CM

## 2023-09-21 NOTE — Progress Notes (Signed)
 Brief Telephone Documentation Reason for Call: Patient left message regarding question for pharmacist   Summary of Call: Patient states that pharmacy informed her that her copay will be ~$800.   It looks like the test claim done previously showed they had met their deductible because her husband's medication was ready at his pharmacy for $800 although he did not pick it up. Since, his prescription has been put back, so now the full deductible amount is showing at the pharmacy.  Patient notes that her husband is planning on changing insurance plans in November to a better plan through his job and she may wait the two months before starting a GLP1.   She also believes that her husband has been filling Trulicity at his pharmacy for the past 2 months for $25 though is not certain. Last week, Pharmacy told me his copay was also ~$800. Husband wakes at ~3pm due to work schedule. They plan to call back this afternoon with more information from him.   Follow Up: Patient given direct line for further questions/concerns.  Manuelita FABIENE Kobs, PharmD Clinical Pharmacist [facility] [phone]

## 2023-11-09 ENCOUNTER — Other Ambulatory Visit: Payer: Self-pay | Admitting: Nurse Practitioner

## 2023-11-09 DIAGNOSIS — E1165 Type 2 diabetes mellitus with hyperglycemia: Secondary | ICD-10-CM

## 2024-01-05 ENCOUNTER — Telehealth: Payer: Self-pay | Admitting: Pharmacist

## 2024-01-05 NOTE — Progress Notes (Signed)
 Chart Review Reason: Patient left message regarding question for the pharmacist (new insurance)  Summary: Patient notes her insurance benefits have changed from last year. Reports they elected for a plan with better medical coverage. Notes Lower deductible for 2026. Lower copays for provider and specialist visits.   Prescription Drug Coverage: Yes - commercial insurance through husband's employer  [employer] [place] PPO  Plan ID = same as previous year Plan ID = Rx [id number] (Rx codes with an E are ASO Net Results C)  Patient remains interested in resuming Glp1 therapy which was cost-prohibitive in 2025 due to very large deductible >$4000 for her and her husband.  Patient reports checking blood sugars at home intermittently.  12/31: 233 (after coffee, no food).  12/23: 203 (unclear if before/after morning coffee)  Considerations: BCBS 2026 Benefit:  Reminder set 01/07/24 to run test claim which may give more detail on deductible/copay amount.    [name], PharmD, BCACP, CPP Clinical Pharmacist Practitioner [facility] Ph: [phone]

## 2024-01-07 ENCOUNTER — Telehealth: Payer: Self-pay | Admitting: Nurse Practitioner

## 2024-01-07 ENCOUNTER — Encounter: Payer: Self-pay | Admitting: Pharmacist

## 2024-01-07 DIAGNOSIS — E1165 Type 2 diabetes mellitus with hyperglycemia: Secondary | ICD-10-CM

## 2024-01-07 MED ORDER — OZEMPIC (0.25 OR 0.5 MG/DOSE) 2 MG/3ML ~~LOC~~ SOPN: 0.2500 mg | PEN_INJECTOR | SUBCUTANEOUS | 0 refills | Status: DC

## 2024-01-07 NOTE — Telephone Encounter (Signed)
 Can we call and verify the last time she has had the ozempic ?

## 2024-01-07 NOTE — Telephone Encounter (Signed)
 Called patient states she last took around 2 months ago around October.

## 2024-01-07 NOTE — Telephone Encounter (Signed)
 Prescription Request  01/07/2024  LOV: 09/10/2023  What is the name of the medication or equipment?   Semaglutide , 2 MG/DOSE, 8 MG/3ML SOPN   metFORMIN  (GLUCOPHAGE ) 500 MG tablet    Have you contacted your pharmacy to request a refill? No   Which pharmacy would you like this sent to?   [id number] [address], [place] - [address] [place] [address] Phone: [phone] Fax: [phone]   Patient notified that their request is being sent to the clinical staff for review and that they should receive a response within 2 business days.   Please advise at Mobile [phone] (mobile)

## 2024-01-07 NOTE — Addendum Note (Signed)
 Addended by: WENDEE LYNWOOD HERO on: 01/07/2024 04:13 PM   Modules accepted: Orders

## 2024-01-07 NOTE — Progress Notes (Signed)
 Ozempic  Test claim on new insurance 2026:  Ozempic  0.25 mg weekly (28-month supply) = [facility]  Notably, appears patient called for refill request for 2 mg dose. Dose no longer appropriate, has not been taking. Lower dose needed. PCP msgd.

## 2024-01-14 ENCOUNTER — Telehealth: Payer: Self-pay

## 2024-01-14 ENCOUNTER — Other Ambulatory Visit (HOSPITAL_COMMUNITY): Payer: Self-pay

## 2024-01-14 NOTE — Telephone Encounter (Signed)
 Pharmacy Patient Advocate Encounter   Received notification from [facility] that prior authorization for Ozempic  2 is required/requested.   Insurance verification completed.   The patient is insured through [facility].   Per test claim: Refill too soon. PA is not needed at this time. Medication was filled 01/07/24. Next eligible fill date is 01/28/24.

## 2024-01-17 ENCOUNTER — Other Ambulatory Visit: Payer: Self-pay | Admitting: Nurse Practitioner

## 2024-01-17 DIAGNOSIS — E1165 Type 2 diabetes mellitus with hyperglycemia: Secondary | ICD-10-CM

## 2024-01-18 ENCOUNTER — Ambulatory Visit: Payer: Self-pay

## 2024-01-18 NOTE — Telephone Encounter (Signed)
 Will see patient then Agree with ER and UC precautions

## 2024-01-18 NOTE — Telephone Encounter (Signed)
 Noted

## 2024-01-18 NOTE — Telephone Encounter (Signed)
 Copied from CRM [id number]. Topic: Clinical - Red Word Triage >> Jan 18, 2024  3:51 PM Viola F wrote: Red Word that prompted transfer to Nurse Triage: Patient having pain above right ear

## 2024-01-18 NOTE — Telephone Encounter (Signed)
 FYI Only or Action Required?: FYI only for provider: appointment scheduled on 1/16.  Patient was last seen in primary care on 09/10/2023 by Wendee Lynwood HERO, NP.  Called Nurse Triage reporting Hair/Scalp Problem.  Symptoms began a week ago.  Interventions attempted: Nothing.  Symptoms are: unchanged.  Triage Disposition: See PCP When Office is Open (Within 3 Days)  Patient/caregiver understands and will follow disposition?: Yes, will follow disposition  Reason for Disposition  [1] New-onset headache AND [2] age > 50 years  Answer Assessment - Initial Assessment Questions 1. LOCATION: Where does it hurt?      Above R ear 2. ONSET: When did the headache start? (e.g., minutes, hours, days)      Last week 3. PATTERN: Does the pain come and go, or has it been constant since it started?     Intermittent worse with movement, and palpation 4. SEVERITY: How bad is the pain? and What does it keep you from doing?  (e.g., Scale 1-10; mild, moderate, or severe)     7 5. RECURRENT SYMPTOM: Have you ever had headaches before? If Yes, ask: When was the last time? and What happened that time?      denies 6. CAUSE: What do you think is causing the headache?     Unsure 7. MIGRAINE: Have you been diagnosed with migraine headaches? If Yes, ask: Is this headache similar?      Hx, does not feel the same,  8. HEAD INJURY: Has there been any recent injury to your head?      denies 9. OTHER SYMPTOMS: Do you have any other symptoms? (e.g., fever, stiff neck, eye pain, sore throat, cold symptoms)     Denies  Pt reports additional L thigh numbness that has been going on for awhile.  Protocols used: [facility]

## 2024-01-21 ENCOUNTER — Ambulatory Visit: Admitting: Family Medicine

## 2024-02-01 NOTE — Telephone Encounter (Signed)
Reason for Disposition  . Caller has already spoken with another triager and has no further questions.    Protocols used: No Contact or Duplicate Contact Call-A-AH

## 2024-02-03 ENCOUNTER — Encounter: Payer: Self-pay | Admitting: Dermatology

## 2024-02-03 ENCOUNTER — Ambulatory Visit: Admitting: Dermatology

## 2024-02-03 DIAGNOSIS — E854 Organ-limited amyloidosis: Secondary | ICD-10-CM | POA: Diagnosis not present

## 2024-02-03 DIAGNOSIS — L99 Other disorders of skin and subcutaneous tissue in diseases classified elsewhere: Secondary | ICD-10-CM

## 2024-02-03 DIAGNOSIS — L810 Postinflammatory hyperpigmentation: Secondary | ICD-10-CM | POA: Diagnosis not present

## 2024-02-03 MED ORDER — CLOBETASOL PROPIONATE 0.05 % EX CREA: 1.0000 | TOPICAL_CREAM | Freq: Two times a day (BID) | CUTANEOUS | 2 refills | Status: AC

## 2024-02-03 NOTE — Patient Instructions (Signed)

## 2024-02-03 NOTE — Progress Notes (Signed)
" ° °  Follow-Up Visit   Subjective  [name] is a 54 y.o. female established patient who presents for FOLLOW UP on the diagnoses listed below:  Patient was last evaluated on 08/18/23.   Lichen Amyloidosis: Pt stated she has continued using Tacrolimus  0.1% mixed with LRP w/ Urea daily. Pt stated she can not see much difference in the appearance.   The following portions of the chart were reviewed this encounter and updated as appropriate: medications, allergies, medical history  Review of Systems:  No other skin or systemic complaints except as noted in HPI or Assessment and Plan.  Objective  Well appearing patient in no apparent distress; mood and affect are within normal limits.   A focused examination was performed of the following areas: B/L legs   Relevant exam findings are noted in the Assessment and Plan.        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 Initial treatment with clobetasol  reduced inflammation, but prolonged use led to skin thinning and hypopigmentation. Transition to tacrolimus  maintained improvement. Current concern is slight thickening and bumpiness, indicating potential rebound of inflammation. Itchiness rated as 2/10, with occasional exacerbation during showers. Hemosiderin deposition noted, likely due to previous inflammation.  - Alternate clobetasol  and tacrolimus  every two weeks: two weeks of clobetasol  0.5% followed by two weeks of tacrolimus  0.1%. - Continue using urea lotion as a regular moisturizer. - Provided refills for clobetasol  and tacrolimus  with five refills each. - Scheduled follow-up appointment in October.   No follow-ups on file.   Documentation: I have reviewed the above documentation for accuracy and completeness, and I agree with the above.  I, Shirron Maranda, CMA II, am acting as scribe for:  Delon Lenis, DO "

## 2024-02-05 ENCOUNTER — Other Ambulatory Visit: Payer: Self-pay | Admitting: Nurse Practitioner

## 2024-02-06 ENCOUNTER — Encounter: Payer: Self-pay | Admitting: Dermatology

## 2024-03-10 ENCOUNTER — Ambulatory Visit: Admitting: Nurse Practitioner

## 2024-11-06 ENCOUNTER — Ambulatory Visit: Admitting: Dermatology
